# Patient Record
Sex: Male | Born: 1940 | Race: White | Hispanic: No | Marital: Married | Smoking: Never smoker
Health system: Southern US, Community
[De-identification: ages and names within clinical notes are randomized; demographics above are authoritative.]

## PROBLEM LIST (undated history)

## (undated) DIAGNOSIS — R05 Cough: Secondary | ICD-10-CM

## (undated) DIAGNOSIS — M75100 Unspecified rotator cuff tear or rupture of unspecified shoulder, not specified as traumatic: Secondary | ICD-10-CM

## (undated) DIAGNOSIS — K5792 Diverticulitis of intestine, part unspecified, without perforation or abscess without bleeding: Secondary | ICD-10-CM

## (undated) DIAGNOSIS — G4730 Sleep apnea, unspecified: Secondary | ICD-10-CM

## (undated) DIAGNOSIS — N200 Calculus of kidney: Secondary | ICD-10-CM

## (undated) DIAGNOSIS — E8881 Metabolic syndrome: Secondary | ICD-10-CM

## (undated) DIAGNOSIS — K5790 Diverticulosis of intestine, part unspecified, without perforation or abscess without bleeding: Secondary | ICD-10-CM

## (undated) DIAGNOSIS — E785 Hyperlipidemia, unspecified: Secondary | ICD-10-CM

## (undated) DIAGNOSIS — Z860100 Personal history of colon polyps, unspecified: Secondary | ICD-10-CM

## (undated) DIAGNOSIS — E119 Type 2 diabetes mellitus without complications: Secondary | ICD-10-CM

## (undated) DIAGNOSIS — Z85828 Personal history of other malignant neoplasm of skin: Secondary | ICD-10-CM

## (undated) DIAGNOSIS — N529 Male erectile dysfunction, unspecified: Secondary | ICD-10-CM

## (undated) DIAGNOSIS — I10 Essential (primary) hypertension: Secondary | ICD-10-CM

## (undated) DIAGNOSIS — E781 Pure hyperglyceridemia: Secondary | ICD-10-CM

## (undated) DIAGNOSIS — C801 Malignant (primary) neoplasm, unspecified: Secondary | ICD-10-CM

## (undated) DIAGNOSIS — R7303 Prediabetes: Secondary | ICD-10-CM

## (undated) DIAGNOSIS — I700 Atherosclerosis of aorta: Secondary | ICD-10-CM

## (undated) DIAGNOSIS — R51 Headache: Secondary | ICD-10-CM

## (undated) DIAGNOSIS — M1990 Unspecified osteoarthritis, unspecified site: Secondary | ICD-10-CM

## (undated) DIAGNOSIS — T7840XA Allergy, unspecified, initial encounter: Secondary | ICD-10-CM

## (undated) HISTORY — DX: Type 2 diabetes mellitus without complications: E11.9

## (undated) HISTORY — DX: Atherosclerosis of aorta: I70.0

## (undated) HISTORY — DX: Metabolic syndrome: E88.81

## (undated) HISTORY — DX: Diverticulosis of intestine, part unspecified, without perforation or abscess without bleeding: K57.90

## (undated) HISTORY — DX: Calculus of kidney: N20.0

## (undated) HISTORY — PX: NASAL SINUS SURGERY: SHX719

## (undated) HISTORY — DX: Pure hyperglyceridemia: E78.1

## (undated) HISTORY — DX: Personal history of colonic polyps: Z86.010

## (undated) HISTORY — DX: Diverticulitis of intestine, part unspecified, without perforation or abscess without bleeding: K57.92

## (undated) HISTORY — DX: Allergy, unspecified, initial encounter: T78.40XA

## (undated) HISTORY — DX: Male erectile dysfunction, unspecified: N52.9

## (undated) HISTORY — PX: COLONOSCOPY W/ BIOPSIES AND POLYPECTOMY: SHX1376

## (undated) HISTORY — PX: RECTAL BIOPSY: SHX2303

## (undated) HISTORY — DX: Headache: R51

## (undated) HISTORY — DX: Malignant (primary) neoplasm, unspecified: C80.1

## (undated) HISTORY — DX: Hyperlipidemia, unspecified: E78.5

## (undated) HISTORY — DX: Personal history of colon polyps, unspecified: Z86.0100

## (undated) HISTORY — DX: Essential (primary) hypertension: I10

---
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
## 2007-01-01 ENCOUNTER — Ambulatory Visit: Payer: Self-pay | Admitting: Internal Medicine

## 2007-01-28 ENCOUNTER — Ambulatory Visit: Payer: Self-pay | Admitting: Internal Medicine

## 2008-08-11 ENCOUNTER — Encounter (INDEPENDENT_AMBULATORY_CARE_PROVIDER_SITE_OTHER): Payer: Self-pay | Admitting: General Surgery

## 2008-08-11 ENCOUNTER — Ambulatory Visit (HOSPITAL_COMMUNITY): Admission: RE | Admit: 2008-08-11 | Discharge: 2008-08-11 | Payer: Self-pay | Admitting: General Surgery

## 2008-08-23 ENCOUNTER — Encounter: Admission: RE | Admit: 2008-08-23 | Discharge: 2008-08-23 | Payer: Self-pay | Admitting: Neurology

## 2008-11-20 ENCOUNTER — Encounter (INDEPENDENT_AMBULATORY_CARE_PROVIDER_SITE_OTHER): Payer: Self-pay | Admitting: Otolaryngology

## 2008-11-20 ENCOUNTER — Ambulatory Visit (HOSPITAL_BASED_OUTPATIENT_CLINIC_OR_DEPARTMENT_OTHER): Admission: RE | Admit: 2008-11-20 | Discharge: 2008-11-20 | Payer: Self-pay | Admitting: Otolaryngology

## 2010-07-27 LAB — GLUCOSE, CAPILLARY: Glucose-Capillary: 112 mg/dL — ABNORMAL HIGH (ref 70–99)

## 2010-07-27 LAB — POCT I-STAT, CHEM 8
Creatinine, Ser: 1.1 mg/dL (ref 0.4–1.5)
Hemoglobin: 12.6 g/dL — ABNORMAL LOW (ref 13.0–17.0)
Potassium: 4.3 mEq/L (ref 3.5–5.1)
Sodium: 139 mEq/L (ref 135–145)

## 2010-07-31 LAB — COMPREHENSIVE METABOLIC PANEL
ALT: 36 U/L (ref 0–53)
AST: 32 U/L (ref 0–37)
Alkaline Phosphatase: 35 U/L — ABNORMAL LOW (ref 39–117)
CO2: 29 mEq/L (ref 19–32)
Chloride: 104 mEq/L (ref 96–112)
Creatinine, Ser: 1.37 mg/dL (ref 0.4–1.5)
GFR calc Af Amer: >60 mL/min (ref >60)
GFR calc non Af Amer: 52 mL/min — ABNORMAL LOW (ref >60)
Potassium: 4.6 mEq/L (ref 3.5–5.1)
Sodium: 140 mEq/L (ref 135–145)
Total Bilirubin: 0.9 mg/dL (ref 0.3–1.2)

## 2010-07-31 LAB — DIFFERENTIAL
Basophils Absolute: 0 10*3/uL (ref 0.0–0.1)
Eosinophils Absolute: 0.2 10*3/uL (ref 0.0–0.7)
Eosinophils Relative: 3 % (ref 0–5)
Monocytes Absolute: 0.7 10*3/uL (ref 0.1–1.0)

## 2010-07-31 LAB — CBC
MCV: 97.1 fL (ref 78.0–100.0)
RBC: 4.2 MIL/uL — ABNORMAL LOW (ref 4.22–5.81)
WBC: 6.4 10*3/uL (ref 4.0–10.5)

## 2010-07-31 LAB — GLUCOSE, CAPILLARY: Glucose-Capillary: 114 mg/dL — ABNORMAL HIGH (ref 70–99)

## 2010-09-03 NOTE — Op Note (Signed)
NAMEZARION, OLIFF            ACCOUNT NO.:  [id number]   MEDICAL RECORD NO.:  [id number]          PATIENT TYPE:  AMB   LOCATION:  DSC                          FACILITY:  [facility]   PHYSICIAN:  Jefry H. Pollyann Kennedy, MD     DATE OF BIRTH:  01-17-1941   DATE OF PROCEDURE:  11/20/2008  DATE OF DISCHARGE:                               OPERATIVE REPORT   REFERRING PHYSICIAN:  Oley Balm. Georgina Pillion, MD   PREOPERATIVE DIAGNOSIS:  Chronic left ethmoid sinusitis.   POSTOPERATIVE DIAGNOSIS:  Chronic left ethmoid sinusitis.   PROCEDURE:  Left endoscopic anterior ethmoidectomy.   SURGEON:  Jefry H. Pollyann Kennedy, MD   General endotracheal anesthesia was used.   No complications.   BLOOD LOSS:  Minimal.   FINDINGS:  An isolated superior ethmoid cell on the left completely  obstructed and filled with thick mucopurulent material.   HISTORY:  A 70 year old with a history of chronic left periorbital  headache and evidence of chronic left ethmoid sinusitis that has failed  to respond to medical therapy.  Risks, benefits, alternatives, and  complications of procedure were explained to the patient, seemed to  understand and agreed for surgery.   PROCEDURE:  The patient was taken to the operating room and placed on  the operating table in supine position.  Following induction of general  endotracheal anesthesia, the patient was positioned for endoscopic sinus  surgery and draped in a standard fashion.  Afrin spray was used  preoperatively in the nasal cavities.  Xylocaine 1% with epinephrine was  infiltrated into the superior and posterior attachments of the middle  turbinate and the lateral nasal wall at the middle meatus.  The sickle  knife was used to incise the base of the uncinate process which was then  removed with straight Wilde Blakesley forceps.  Using the 0-degree  endoscope, the infundibular area was inspected thoroughly and the bulla  was opened with a straight suction.  The microdebrider was then  used to  completely open up the ethmoid bulla and the anterior ethmoid cells.  The diseased cell was identified and suction was used to enter into this  particular sinus where large amount of thick inspissated mucopus was  obtained and suctioned out.  The sinus was opened widely using the  microdebrider and upbiting forceps.  There were no other abnormalities  identified.  Afrin-soaked pledgets were placed in the anterior ethmoid  cavity on the left and kept in place until after extubation.  The  patient was extubated and awakened from anesthesia.  The packing was  removed and the patient was then transferred to recovery in stable  condition.      Jefry H. Pollyann Kennedy, MD  Electronically Signed     JHR/MEDQ  D:  11/20/2008  T:  11/21/2008  Job:  [id number]   cc:   Oley Balm. Georgina Pillion, M.D.

## 2010-09-03 NOTE — Op Note (Signed)
NAME:  Christian Ross, Christian Ross            ACCOUNT NO.:  [id number]   MEDICAL RECORD NO.:  [id number]          PATIENT TYPE:  AMB   LOCATION:  DAY                          [facility]   PHYSICIAN:  Adolph Pollack, M.D.DATE OF BIRTH:  [birth year]   DATE OF PROCEDURE:  08/11/2008  DATE OF DISCHARGE:                               OPERATIVE REPORT   PREOPERATIVE DIAGNOSES:  Persistent anal ulcer.   POSTOPERATIVE DIAGNOSES:  Persistent anal ulcer.   PROCEDURE:  1. Examination under anesthesia.  2. Anoscopy.  3. Biopsy of anal ulcer.   SURGEON:  Adolph Pollack, M.D.   ANESTHESIA:  General.   INDICATIONS FOR PROCEDURE:  [name] is a +70 year old male who has  some significant perianal skin changes with biopsy benign; however, he  had what I felt was initially an anal fissure; however, as his perianal  skin changes  have improved and he is relatively asymptomatic from this,  and it looks more like an anal ulcer suspicious for possible squamous  cell cancer.  It is for this reason that he is brought to the operating  room.   DESCRIPTION OF PROCEDURE:  He was brought to the operating room and  placed supine on the operating table and a general anesthetic was  administered.  He was placed in the lithotomy position.  The perianal  area was sterilely prepped and draped.  An external exam demonstrated an  external hemorrhoid, right anterolateral position.  On digital rectal  examination, no masses were felt.   An anoscopy was performed and he had a significantly-sized posterior  anal ulcer present.  I went ahead and sharply biopsied this and sent it  to pathology.  The bleeding was controlled with electrocautery.  No  other lesions in the anus or distal rectum were noted.   Once hemostasis was adequate, I performed an anal block with 0.5%  Marcaine.  A piece of Gelfoam was then placed over the wound and a bulky  dressing was applied.   He tolerated procedure well without  apparent complications and was taken  to recovery in satisfactory condition.      Adolph Pollack, M.D.  Electronically Signed     TJR/MEDQ  D:  08/11/2008  T:  08/11/2008  Job:  [id number]   cc:   Vesta Mixer, M.D.  Fax: [phone]   Oley Balm. Georgina Pillion, M.D.  Fax: [phone]

## 2010-09-30 ENCOUNTER — Telehealth: Payer: Self-pay | Admitting: Cardiovascular Disease

## 2010-09-30 NOTE — Telephone Encounter (Signed)
Calling about a generic medication version of Abapro that his pharmacy is trying to switch him too.. [facility] [address] [facility] his prescriptions of Crestor, Marta Lamas and Crestor and when he calls back they say that they havent been verified

## 2010-09-30 NOTE — Telephone Encounter (Signed)
Pt called, we haven't received any request from [facility], he will find out and call back. 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

## 2010-10-15 ENCOUNTER — Emergency Department (HOSPITAL_COMMUNITY)
Admission: EM | Admit: 2010-10-15 | Discharge: 2010-10-15 | Disposition: A | Discharge status: Home / Self Care | Payer: Medicare Other | Attending: Emergency Medicine | Admitting: Emergency Medicine

## 2010-10-15 ENCOUNTER — Telehealth: Payer: Self-pay | Admitting: Internal Medicine

## 2010-10-15 ENCOUNTER — Emergency Department (HOSPITAL_COMMUNITY): Payer: Medicare Other

## 2010-10-15 DIAGNOSIS — E7800 Pure hypercholesterolemia, unspecified: Secondary | ICD-10-CM | POA: Insufficient documentation

## 2010-10-15 DIAGNOSIS — K5732 Diverticulitis of large intestine without perforation or abscess without bleeding: Secondary | ICD-10-CM | POA: Insufficient documentation

## 2010-10-15 DIAGNOSIS — R1032 Left lower quadrant pain: Secondary | ICD-10-CM | POA: Insufficient documentation

## 2010-10-15 DIAGNOSIS — I10 Essential (primary) hypertension: Secondary | ICD-10-CM | POA: Insufficient documentation

## 2010-10-15 LAB — COMPREHENSIVE METABOLIC PANEL
Albumin: 4.2 g/dL (ref 3.5–5.2)
Alkaline Phosphatase: 49 U/L (ref 39–117)
BUN: 19 mg/dL (ref 6–23)
Calcium: 10.4 mg/dL (ref 8.4–10.5)
GFR calc Af Amer: >60 mL/min (ref >60)
Glucose, Bld: 102 mg/dL — ABNORMAL HIGH (ref 70–99)
Potassium: 4.7 mEq/L (ref 3.5–5.1)
Sodium: 135 mEq/L (ref 135–145)
Total Protein: 7.7 g/dL (ref 6.0–8.3)

## 2010-10-15 LAB — LIPASE, BLOOD: Lipase: 38 U/L (ref 11–59)

## 2010-10-15 LAB — CBC
HCT: 40.3 % (ref 39.0–52.0)
MCH: 31.8 pg (ref 26.0–34.0)
MCHC: 33.3 g/dL (ref 30.0–36.0)
RDW: 13.4 % (ref 11.5–15.5)

## 2010-10-15 LAB — DIFFERENTIAL
Basophils Absolute: 0 10*3/uL (ref 0.0–0.1)
Basophils Relative: 0 % (ref 0–1)
Eosinophils Relative: 3 % (ref 0–5)
Monocytes Absolute: 1.3 10*3/uL — ABNORMAL HIGH (ref 0.1–1.0)
Monocytes Relative: 12 % (ref 3–12)

## 2010-10-15 MED ORDER — IOHEXOL 300 MG/ML  SOLN
100.0000 mL | Freq: Once | INTRAMUSCULAR | Status: AC | PRN
  Administered 2010-10-15: 100 mL via INTRAVENOUS

## 2010-10-15 NOTE — Telephone Encounter (Signed)
Left message for patient to call back  

## 2010-10-16 NOTE — Telephone Encounter (Signed)
Left message for patient to call back  

## 2010-10-17 NOTE — Telephone Encounter (Signed)
I have left another message that if they still need an appt please call back and ask to speak with me.

## 2010-10-28 ENCOUNTER — Encounter: Payer: Self-pay | Admitting: Internal Medicine

## 2010-10-31 ENCOUNTER — Encounter: Payer: Self-pay | Admitting: Cardiovascular Disease

## 2010-11-07 ENCOUNTER — Ambulatory Visit: Payer: Self-pay | Admitting: Cardiovascular Disease

## 2010-12-13 ENCOUNTER — Ambulatory Visit: Admitting: Internal Medicine

## 2010-12-18 ENCOUNTER — Ambulatory Visit: Payer: Self-pay | Admitting: Cardiovascular Disease

## 2011-01-02 ENCOUNTER — Encounter: Payer: Self-pay | Admitting: Cardiovascular Disease

## 2011-01-02 ENCOUNTER — Ambulatory Visit (INDEPENDENT_AMBULATORY_CARE_PROVIDER_SITE_OTHER): Payer: Medicare Other | Admitting: Cardiovascular Disease

## 2011-01-02 DIAGNOSIS — I10 Essential (primary) hypertension: Secondary | ICD-10-CM | POA: Insufficient documentation

## 2011-01-02 DIAGNOSIS — E785 Hyperlipidemia, unspecified: Secondary | ICD-10-CM

## 2011-01-02 MED ORDER — IRBESARTAN 150 MG PO TABS: 150.0000 mg | ORAL_TABLET | Freq: Every day | ORAL | Status: DC

## 2011-01-02 NOTE — Assessment & Plan Note (Signed)
His lipid levels have been well controlled. We'll continue with the same medications.

## 2011-01-02 NOTE — Assessment & Plan Note (Signed)
His blood pressure remained well controlled. I'll see him again in one year.

## 2011-01-02 NOTE — Progress Notes (Signed)
Christian Ross Date of Birth  03-28-1941 [facility] [address]     [address] [place], [place]  [address] [phone]  Fax  [phone]  History of Present Illness:  70 year old gentleman with a history of hyperlipidemia, hypertension, diabetes mellitus. He's done very well since I last saw him a year ago. He has not had any episodes of chest pain or shortness of breath. He's been taking some coconut oil in his lipid profile has improved. His hemoglobin A1c is also improved slightly from 6 months ago.    Current Outpatient Prescriptions on File Prior to Visit  Medication Sig Dispense Refill  . Ascorbic Acid (VITAMIN C PO) Take 300 mg by mouth daily.        Marland Kitchen aspirin 81 MG tablet Take 81 mg by mouth daily.        . B Complex Vitamins (B COMPLEX PO) Take by mouth.        . Cholecalciferol (VITAMIN D PO) Take by mouth daily. Vitamin D3      . Cholecalciferol (VITAMIN D) 2000 UNITS CAPS Take 2,000 Units by mouth daily.        Marland Kitchen COENZYME Q-10 PO Take by mouth daily. ubiquinol      . fenofibrate (TRICOR) 145 MG tablet Take 145 mg by mouth daily.        . Irbesartan (AVAPRO PO) Take 50 mg by mouth daily.        . Multiple Vitamin (MULTIVITAMIN PO) Take by mouth.        Marland Kitchen NIACIN PO Take 500 mg by mouth daily.        . Omega-3-acid Ethyl Esters (LOVAZA PO) Take by mouth 3 (three) times daily.        . rosuvastatin (CRESTOR) 10 MG tablet Take 10 mg by mouth daily.        Marland Kitchen VITAMIN A PO Take by mouth daily.          Allergies  Allergen Reactions  . Lisinopril     Causes a cough    Past Medical History  Diagnosis Date  . Hyperlipidemia   . Vitamin D deficiency   . Hypertension     orthostatic  . Hypertriglyceridemia   . Insulin resistance   . Erectile dysfunction   . History of colonoscopy 01-28-2007    Past Surgical History  Procedure Date  . Knee surgery 1960  . 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 Colonoscopy w/ biopsies and  polypectomy 06/2003, 01/2007    tubular and hyperplastic polyps, liver mastastasis, external hemorrhoids, diverticulosis    History  Smoking status  . Never Smoker   Smokeless tobacco  . Not on file    History  Alcohol Use  . Yes    Beer Occasionally    No family history on file.  Reviw of Systems:  Reviewed in the HPI.  All other systems are negative.  Physical Exam: BP 138/82  Pulse 88  Ht 6' (1.829 m)  Wt 209 lb (94.802 kg)  BMI 28.35 kg/m2 The patient is alert and oriented x 3.  The mood and affect are normal.   Skin: warm and dry.  Color is normal.    HEENT:   the sclera are nonicteric.  The mucous membranes are moist.  The carotids are 2+ without bruits.  There is no thyromegaly.  There is no JVD.    Lungs: clear.  The chest wall is non tender.    Heart: regular  rate with a normal S1 and S2.  There are no murmurs, gallops, or rubs. The PMI is not displaced.     Abdomen: good bowel sounds.  There is no guarding or rebound.  There is no hepatosplenomegaly or tenderness.  There are no masses.   Extremities:  no clubbing, cyanosis, or edema.  The legs are without rashes.  The distal pulses are intact.   Neuro:  Cranial nerves II - XII are intact.  Motor and sensory functions are intact.    The gait is normal.  ECG:  Assessment / Plan:

## 2011-01-20 ENCOUNTER — Ambulatory Visit (INDEPENDENT_AMBULATORY_CARE_PROVIDER_SITE_OTHER): Payer: Medicare Other | Admitting: Internal Medicine

## 2011-01-20 ENCOUNTER — Encounter: Payer: Self-pay | Admitting: Internal Medicine

## 2011-01-20 VITALS — BP 128/60 | HR 62 | Ht 72.0 in | Wt 208.0 lb

## 2011-01-20 DIAGNOSIS — Z860100 Personal history of colon polyps, unspecified: Secondary | ICD-10-CM | POA: Insufficient documentation

## 2011-01-20 DIAGNOSIS — K5730 Diverticulosis of large intestine without perforation or abscess without bleeding: Secondary | ICD-10-CM

## 2011-01-20 NOTE — Assessment & Plan Note (Signed)
He is well now. At risk for repeated diverticulitis but would not do anything more than observe. Instructions provided - to increase fiber in diet.

## 2011-01-20 NOTE — Patient Instructions (Addendum)
Diverticulosis Diverticulosis is a common condition that develops when small pouches (diverticula) form in the wall of the colon. The risk of diverticulosis increases with age. It happens more often in people who eat a low-fiber diet. Most individuals with diverticulosis have no symptoms. Those individuals with symptoms usually experience belly (abdominal) pain, constipation, or loose stools (diarrhea). HOME CARE INSTRUCTIONS  Increase the amount of fiber in your diet as directed by your caregiver or dietician. This may reduce symptoms of diverticulosis. Try to maintain a high-fiber diet.  Your caregiver may recommend taking a dietary fiber supplement. If constipated.  Drink at least 6 to 8 glasses of water or other fluid each day to prevent constipation.   Try not to strain when you have a bowel movement.   Your caregiver may recommend avoiding nuts and seeds to prevent complications, although this is still an uncertain benefit. NOT NECESSARY UNLESS CAUSING PROBLEMS  Only take over-the-counter or prescription medicines for pain, discomfort, or fever as directed by your caregiver.  FOODS HAVING HIGH FIBER CONTENT INCLUDE:  Fruits. Apple, peach, pear, tangerine, raisins, prunes.   Vegetables. Brussels sprouts, asparagus, broccoli, cabbage, carrot, cauliflower, romaine lettuce, spinach, summer squash, tomato, winter squash, zucchini.   Starchy Vegetables. Baked beans, kidney beans, lima beans, split peas, lentils, potatoes (with skin).   Grains. Whole wheat bread, brown rice, bran flake cereal, plain oatmeal, white rice, shredded wheat, bran muffins.  SEEK IMMEDIATE MEDICAL CARE IF:  You develop increasing pain or severe bloating.   You have an oral temperature above 100.5 F, not controlled by medicine.   You develop vomiting or bowel movements that are bloody or black.  Document Released: 01/03/2004 Document Re-Released: 09/25/2009 [facility] Patient Information 2011 [place],  [place].  Follow-up as needed.

## 2011-01-20 NOTE — Progress Notes (Signed)
  Subjective:    Patient ID: Christian Ross, male    DOB: November 02, 1940, 70 y.o.   MRN: [id number]  HPI He was diagnosed and treated for left colon diverticulitis in late June 2012. CT confirmed. No pain and no GI symptoms or signs since.    Review of Systems All other negative    Objective:   Physical Exam General:  WDWN NAD Eyes: anicteric Lungs: clear Heart: S1S2 no rubs, murmurs or gallops Abdomen: soft and nontender, BS+           Assessment & Plan:

## 2011-01-21 ENCOUNTER — Encounter: Payer: Self-pay | Admitting: Internal Medicine

## 2011-04-14 ENCOUNTER — Encounter: Payer: Self-pay | Admitting: Cardiovascular Disease

## 2011-04-29 ENCOUNTER — Telehealth: Payer: Self-pay | Admitting: Cardiovascular Disease

## 2011-04-29 ENCOUNTER — Other Ambulatory Visit: Payer: Self-pay | Admitting: *Deleted

## 2011-04-29 NOTE — Telephone Encounter (Signed)
Pt requesting a nurse call °

## 2011-04-29 NOTE — Telephone Encounter (Signed)
Pt is on crestor 10 mg also with tricor / generic was new for him when he noticed muscle ache. Myopathy comes up as warning for that combination when i went to refill as DAW, i stopped and wanted to see if you wanted to keep the same or change? Please advise.

## 2011-04-29 NOTE — Telephone Encounter (Signed)
He may have brand name

## 2011-04-29 NOTE — Telephone Encounter (Signed)
Opened in error

## 2011-04-29 NOTE — Telephone Encounter (Signed)
Pt c/o muscle ache with generic tricor pt would like name brand, please advise.

## 2011-04-30 ENCOUNTER — Telehealth: Payer: Self-pay | Admitting: Cardiovascular Disease

## 2011-04-30 NOTE — Telephone Encounter (Signed)
Christian Ross has multiple medication changes, lipid meds, HTN meds.  He'll need an office visit to sort this out.

## 2011-04-30 NOTE — Telephone Encounter (Signed)
See note/ app set

## 2011-04-30 NOTE — Telephone Encounter (Signed)
Fu call again Patient is calling your back

## 2011-04-30 NOTE — Telephone Encounter (Signed)
Fu call °Patient is returning your call °

## 2011-05-26 ENCOUNTER — Ambulatory Visit: Payer: Medicare Other | Admitting: Cardiovascular Disease

## 2011-06-04 ENCOUNTER — Ambulatory Visit (INDEPENDENT_AMBULATORY_CARE_PROVIDER_SITE_OTHER): Payer: Medicare Other | Admitting: Cardiovascular Disease

## 2011-06-04 ENCOUNTER — Telehealth: Payer: Self-pay | Admitting: Cardiovascular Disease

## 2011-06-04 ENCOUNTER — Encounter: Payer: Self-pay | Admitting: Cardiovascular Disease

## 2011-06-04 DIAGNOSIS — I10 Essential (primary) hypertension: Secondary | ICD-10-CM

## 2011-06-04 DIAGNOSIS — E785 Hyperlipidemia, unspecified: Secondary | ICD-10-CM

## 2011-06-04 MED ORDER — METOPROLOL TARTRATE 25 MG PO TABS: 25.0000 mg | ORAL_TABLET | Freq: Two times a day (BID) | ORAL | Status: DC

## 2011-06-04 NOTE — Telephone Encounter (Signed)
MEDICATION DISCUSSED/ METOPROLOL TARTRATE VS SUCCINATE. PT AGREED TO PLAN.

## 2011-06-04 NOTE — Progress Notes (Signed)
Christian Ross Date of Birth  [birth year] [facility]     [place] Office  [address]    [address]   [address] [place], [place]  [address]    [place], [place]  [address] [phone]  Fax  [phone]  [phone]  Fax [phone]  1. Hyperlipidemia  History of Present Illness:  Christian Ross presents today for further discussion of his blood pressure medicines and costal medicines. His insurance company initially changed his TriCor to generic fenofibrate. Following that the generic fenofibrate was also reviewed.  He's also been having some muscle aches and pains with a combination of Crestor and fenofibrate.  Current Outpatient Prescriptions on File Prior to Visit  Medication Sig Dispense Refill  . Ascorbic Acid (VITAMIN C PO) Take 300 mg by mouth daily.        Marland Kitchen aspirin 81 MG tablet Take 81 mg by mouth daily.        . B Complex Vitamins (B COMPLEX PO) Take by mouth.        . Cholecalciferol (VITAMIN D PO) Take by mouth daily. Vitamin D3      . Cholecalciferol (VITAMIN D) 2000 UNITS CAPS Take 2,000 Units by mouth daily.        Marland Kitchen COENZYME Q-10 PO Take by mouth daily. ubiquinol      . Homeopathic Products (BERBERIS HOMACCORD PO) Take by mouth 3 (three) times daily.        . irbesartan (AVAPRO) 150 MG tablet Take 1 tablet (150 mg total) by mouth daily.  90 tablet  3  . Multiple Vitamin (MULTIVITAMIN PO) Take by mouth.        Marland Kitchen NIACIN PO Take 500 mg by mouth daily.        . Omega-3-acid Ethyl Esters (LOVAZA PO) Take by mouth 3 (three) times daily.        . rosuvastatin (CRESTOR) 10 MG tablet Take 10 mg by mouth daily.        Marland Kitchen VITAMIN A PO Take by mouth daily.          Allergies  Allergen Reactions  . Lisinopril     Causes a cough    Past Medical History  Diagnosis Date  . Hyperlipidemia   . Vitamin d deficiency   . Hypertension     orthostatic  . Hypertriglyceridemia   . Insulin resistance   . Erectile dysfunction   . Hemorrhoids   . Inguinal  hernia   . Secondary malignant neoplasm of liver   . Hx of colonic polyps   . Diverticulitis     Past Surgical History  Procedure Date  . Knee surgery 1960    right  . 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 Colonoscopy w/ biopsies and polypectomy 06/2003, 01/2007    tubular and hyperplastic polyps, liver mastastasis, external hemorrhoids, diverticulosis  . Nasal sinus surgery     History  Smoking status  . Never Smoker   Smokeless tobacco  . Never Used    History  Alcohol Use No    Family History  Problem Relation Age of Onset  . Colon cancer Neg Hx     Reviw of Systems:  Reviewed in the HPI.  All other systems are negative.  Physical Exam: Blood pressure 144/80, pulse 96, resp. rate 16, height 6' (1.829 m), weight 214 lb (97.07 kg). General: Well developed, well nourished, in no acute distress.  Head: Normocephalic, atraumatic, sclera non-icteric, mucus membranes are moist,   Neck: Supple. Negative  for carotid bruits. JVD not elevated.  Lungs: Clear bilaterally to auscultation without wheezes, rales, or rhonchi. Breathing is unlabored.  Heart: RRR with S1 S2. No murmurs, rubs, or gallops appreciated.  Abdomen: Soft, non-tender, non-distended with normoactive bowel sounds. No hepatomegaly. No rebound/guarding. No obvious abdominal masses.  Msk:  Strength and tone appear normal for age.  Extremities: No clubbing or cyanosis. No edema.  Distal pedal pulses are 2+ and equal bilaterally.  Neuro: Alert and oriented X 3. Moves all extremities spontaneously.  Psych:  Responds to questions appropriately with a normal affect.  ECG:  Assessment / Plan:

## 2011-06-04 NOTE — Patient Instructions (Addendum)
Your physician wants you to follow-up in: 6 months  You will receive a reminder letter in the mail two months in advance. If you don't receive a letter, please call our office to schedule the follow-up appointment.  Your physician has recommended you make the following change in your medication:   Start metoprolol 25 mg one tablet twice a day 12 hrs apart   Your physician recommends that you return for a FASTING lipid profile: 6 months

## 2011-06-04 NOTE — Telephone Encounter (Signed)
New msg Pt saw Dr Elease Hashimoto this morning. He had questions about metoprolol. Please call

## 2011-06-04 NOTE — Assessment & Plan Note (Signed)
He was having some problems with the fenofibrate and Crestor. At this point we will discontinue the fenofibrate completely. We will continue with the current dose of Crestor.  In our discussion, it became clear that he was taking nicotinamide which is  the inactive form of niacin. I've recommended that he take nicotinic acid instead.  We'll recheck his lipids again in 6 months.

## 2011-06-23 ENCOUNTER — Other Ambulatory Visit: Payer: Self-pay | Admitting: *Deleted

## 2011-06-24 ENCOUNTER — Other Ambulatory Visit: Payer: Self-pay

## 2011-06-24 DIAGNOSIS — I10 Essential (primary) hypertension: Secondary | ICD-10-CM

## 2011-06-24 MED ORDER — IRBESARTAN 150 MG PO TABS: 150.0000 mg | ORAL_TABLET | Freq: Every day | ORAL | Status: DC

## 2011-06-26 ENCOUNTER — Other Ambulatory Visit: Payer: Self-pay | Admitting: *Deleted

## 2011-06-26 ENCOUNTER — Telehealth: Payer: Self-pay | Admitting: Cardiovascular Disease

## 2011-06-26 MED ORDER — VALSARTAN 160 MG PO TABS: 160.0000 mg | ORAL_TABLET | Freq: Every day | ORAL | Status: DC

## 2011-06-26 NOTE — Telephone Encounter (Signed)
PT CALLED WITH MED CHANGE DUE TO INSURANCE. TOLD TO TAKE BP READING AT LEAST 3XWEEK AND CALL WITH ANY QUESTIONS PT VERBALIZED UNDERSTANDING.

## 2011-06-26 NOTE — Telephone Encounter (Signed)
avapro was denied per insurance agent, dr Elease Hashimoto will be shown list of approved meds and will call to pharmacy, pt made aware.

## 2011-06-26 NOTE — Telephone Encounter (Signed)
Please return call to patient on mobile# [phone]  Patient reports pharmacy does not have his RX for irbesartan (AVAPRO) 150 MG tablet.  Per refill note, Rx fille 06/24/11.  [facility] [address]   Please return call to patient on mobile # as he is out of medication.     Summary: Take 1 tablet (150 mg total) by mouth daily., Starting 06/24/2011, Until Wed 06/23/12, Normal Dose, Route, Frequency: 150 mg, Oral, Daily  Start: 06/24/2011  End: 06/23/2012  Ord/Sold: 06/24/2011 (O)  Report  Taking:  Long-term:   [id number] [place] [place] - [address] AT [address] [facility] [address]  Med Dose History  Change   Patient Sig: Take 1 tablet (150 mg total) by mouth daily.   Ordered on: 06/24/2011   Authorized by: Elyn Aquas.   Dispense: 90 tablet

## 2011-06-26 NOTE — Telephone Encounter (Signed)
Needs prior authorization

## 2011-08-04 ENCOUNTER — Encounter: Payer: Self-pay | Admitting: Cardiovascular Disease

## 2011-08-28 DIAGNOSIS — S93629A Sprain of tarsometatarsal ligament of unspecified foot, initial encounter: Secondary | ICD-10-CM | POA: Diagnosis not present

## 2011-08-28 DIAGNOSIS — M84376A Stress fracture, unspecified foot, initial encounter for fracture: Secondary | ICD-10-CM | POA: Diagnosis not present

## 2011-10-27 DIAGNOSIS — R1032 Left lower quadrant pain: Secondary | ICD-10-CM | POA: Diagnosis not present

## 2011-10-27 DIAGNOSIS — K5730 Diverticulosis of large intestine without perforation or abscess without bleeding: Secondary | ICD-10-CM | POA: Diagnosis not present

## 2011-10-29 DIAGNOSIS — R1032 Left lower quadrant pain: Secondary | ICD-10-CM | POA: Diagnosis not present

## 2011-10-29 DIAGNOSIS — K5730 Diverticulosis of large intestine without perforation or abscess without bleeding: Secondary | ICD-10-CM | POA: Diagnosis not present

## 2011-12-26 ENCOUNTER — Other Ambulatory Visit: Payer: Self-pay | Admitting: Dermatology

## 2011-12-26 DIAGNOSIS — D485 Neoplasm of uncertain behavior of skin: Secondary | ICD-10-CM | POA: Diagnosis not present

## 2011-12-26 DIAGNOSIS — L821 Other seborrheic keratosis: Secondary | ICD-10-CM | POA: Diagnosis not present

## 2011-12-26 DIAGNOSIS — D239 Other benign neoplasm of skin, unspecified: Secondary | ICD-10-CM | POA: Diagnosis not present

## 2011-12-26 DIAGNOSIS — L570 Actinic keratosis: Secondary | ICD-10-CM | POA: Diagnosis not present

## 2011-12-26 DIAGNOSIS — Z85828 Personal history of other malignant neoplasm of skin: Secondary | ICD-10-CM | POA: Diagnosis not present

## 2011-12-29 ENCOUNTER — Other Ambulatory Visit: Payer: Self-pay | Admitting: Cardiovascular Disease

## 2011-12-29 NOTE — Telephone Encounter (Signed)
Fax Received. Refill Completed. Tonishia Steffy Chowoe (R.M.A)   

## 2012-01-06 ENCOUNTER — Ambulatory Visit: Payer: Medicare Other | Admitting: Cardiovascular Disease

## 2012-02-10 ENCOUNTER — Encounter: Payer: Self-pay | Admitting: Cardiovascular Disease

## 2012-02-10 ENCOUNTER — Ambulatory Visit (INDEPENDENT_AMBULATORY_CARE_PROVIDER_SITE_OTHER): Payer: Medicare Other | Admitting: Cardiovascular Disease

## 2012-02-10 VITALS — BP 110/78 | HR 84 | Ht 72.0 in | Wt 222.8 lb

## 2012-02-10 DIAGNOSIS — E785 Hyperlipidemia, unspecified: Secondary | ICD-10-CM

## 2012-02-10 DIAGNOSIS — I10 Essential (primary) hypertension: Secondary | ICD-10-CM

## 2012-02-10 NOTE — Assessment & Plan Note (Signed)
He's tolerating his Crestor and fenofibrate now. He'll have his labs checked at his general medical doctors office in April. I'll see him again in May to review these.

## 2012-02-10 NOTE — Assessment & Plan Note (Signed)
He was doing very well. He's not had any episodes of chest pain or shortness breath. His blood pressure has been well controlled. Dr. Earlene Plater added Diovan to his medical regimen he seems to be working quite well. We'll continue the same medications. I'll see him again in about a 7-8 months.

## 2012-02-10 NOTE — Patient Instructions (Addendum)
Your physician wants you to follow-up in: 7 MONTHS // MAY You will receive a reminder letter in the mail two months in advance. If you don't receive a letter, please call our office to schedule the follow-up appointment.  BRING COPY OF LABS ON YOUR APPOIMNTMENT

## 2012-02-10 NOTE — Progress Notes (Signed)
Christian Ross Date of Birth  26-May-1940 [facility]      Office  [address]    [address]   [address] [place], [place]  [address]    [place], [place]  [address] [phone]  Fax  [phone]  [phone]  Fax [phone]  1. Hyperlipidemia 2.Hypertension  History of Present Illness:  Christian Ross seems to be doing well.  He continues to have elevations in his triglyceride levels.  He is tolerating the crestor now.    Current Outpatient Prescriptions on File Prior to Visit  Medication Sig Dispense Refill  . Ascorbic Acid (VITAMIN C PO) Take 300 mg by mouth daily.        Marland Kitchen aspirin 81 MG tablet Take 81 mg by mouth daily.        . B Complex Vitamins (B COMPLEX PO) Take by mouth.        . Cholecalciferol (VITAMIN D PO) Take by mouth daily. Vitamin D3      . Cholecalciferol (VITAMIN D) 2000 UNITS CAPS Take 2,000 Units by mouth daily.        Marland Kitchen COENZYME Q-10 PO Take by mouth daily. ubiquinol      . DIOVAN 160 MG tablet TAKE 1 TABLET BY MOUTH ONCE DAILY  90 tablet  3  . Homeopathic Products (BERBERIS HOMACCORD PO) Take by mouth 3 (three) times daily.        . metoprolol tartrate (LOPRESSOR) 25 MG tablet TAKE 1 TABLET BY MOUTH TWICE DAILY  180 tablet  3  . Multiple Vitamin (MULTIVITAMIN PO) Take by mouth.        . Niacin (NICOTINIC ACID) 250 MG CR capsule Take 500 mg by mouth at bedtime.      . Omega-3-acid Ethyl Esters (LOVAZA PO) Take by mouth 3 (three) times daily.        . rosuvastatin (CRESTOR) 10 MG tablet Take 10 mg by mouth daily.        Marland Kitchen VITAMIN A PO Take by mouth daily.        Marland Kitchen DISCONTD: irbesartan (AVAPRO) 150 MG tablet Take 1 tablet (150 mg total) by mouth daily.  90 tablet  3    Allergies  Allergen Reactions  . Lisinopril     Causes a cough    Past Medical History  Diagnosis Date  . Hyperlipidemia   . Vitamin D deficiency   . Hypertension     orthostatic  . Hypertriglyceridemia   . Insulin resistance   . Erectile dysfunction   .  Hemorrhoids   . Inguinal hernia   . 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 Hx of colonic polyps   . Diverticulitis     Past Surgical History  Procedure Date  . Knee surgery 1960    right  . 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 Colonoscopy w/ biopsies and polypectomy 06/2003, 01/2007    tubular and hyperplastic polyps, liver mastastasis, external hemorrhoids, diverticulosis  . Nasal sinus surgery     History  Smoking status  . Never Smoker   Smokeless tobacco  . Never Used    History  Alcohol Use No    Family History  Problem Relation Age of Onset  . Colon cancer Neg Hx     Reviw of Systems:  Reviewed in the HPI.  All other systems are negative.  Physical Exam: Blood pressure 110/78, pulse 84, height 6' (1.829 m), weight 222 lb 12.8 oz (101.061 kg). General: Well developed, well nourished,  in no acute distress.  Head: Normocephalic, atraumatic, sclera non-icteric, mucus membranes are moist,   Neck: Supple. Negative for carotid bruits. JVD not elevated.  Lungs: Clear bilaterally to auscultation without wheezes, rales, or rhonchi. Breathing is unlabored.  Heart: RRR with S1 S2. No murmurs, rubs, or gallops appreciated.  Abdomen: Soft, non-tender, non-distended with normoactive bowel sounds. No hepatomegaly. No rebound/guarding. No obvious abdominal masses.  Msk:  Strength and tone appear normal for age.  Extremities: No clubbing or cyanosis. No edema.  Distal pedal pulses are 2+ and equal bilaterally.  Neuro: Alert and oriented X 3. Moves all extremities spontaneously.  Psych:  Responds to questions appropriately with a normal affect.  ECG: August 04, 2011 - normal sinus rhythm at around 65 beats a minute. He has no ST or T wave changes. His EKG was performed at [facility] Ross office for FAA physical  Assessment / Plan:

## 2012-02-12 DIAGNOSIS — Z0000 Encounter for general adult medical examination without abnormal findings: Secondary | ICD-10-CM | POA: Diagnosis not present

## 2012-03-23 DIAGNOSIS — H524 Presbyopia: Secondary | ICD-10-CM | POA: Diagnosis not present

## 2012-03-23 DIAGNOSIS — H43819 Vitreous degeneration, unspecified eye: Secondary | ICD-10-CM | POA: Diagnosis not present

## 2012-04-08 DIAGNOSIS — R51 Headache: Secondary | ICD-10-CM | POA: Diagnosis not present

## 2012-05-06 ENCOUNTER — Encounter (HOSPITAL_COMMUNITY): Payer: Self-pay | Admitting: Emergency Medicine

## 2012-05-06 ENCOUNTER — Emergency Department (HOSPITAL_COMMUNITY)
Admission: EM | Admit: 2012-05-06 | Discharge: 2012-05-06 | Disposition: A | Discharge status: Home / Self Care | Payer: Medicare Other | Attending: Emergency Medicine | Admitting: Emergency Medicine

## 2012-05-06 DIAGNOSIS — E781 Pure hyperglyceridemia: Secondary | ICD-10-CM | POA: Diagnosis not present

## 2012-05-06 DIAGNOSIS — Z7982 Long term (current) use of aspirin: Secondary | ICD-10-CM | POA: Insufficient documentation

## 2012-05-06 DIAGNOSIS — H5789 Other specified disorders of eye and adnexa: Secondary | ICD-10-CM | POA: Insufficient documentation

## 2012-05-06 DIAGNOSIS — E785 Hyperlipidemia, unspecified: Secondary | ICD-10-CM | POA: Diagnosis not present

## 2012-05-06 DIAGNOSIS — Z860100 Personal history of colon polyps, unspecified: Secondary | ICD-10-CM | POA: Insufficient documentation

## 2012-05-06 DIAGNOSIS — Z79899 Other long term (current) drug therapy: Secondary | ICD-10-CM | POA: Diagnosis not present

## 2012-05-06 DIAGNOSIS — B029 Zoster without complications: Secondary | ICD-10-CM | POA: Diagnosis not present

## 2012-05-06 DIAGNOSIS — Z8719 Personal history of other diseases of the digestive system: Secondary | ICD-10-CM | POA: Insufficient documentation

## 2012-05-06 DIAGNOSIS — C787 Secondary malignant neoplasm of liver and intrahepatic bile duct: Secondary | ICD-10-CM | POA: Diagnosis not present

## 2012-05-06 DIAGNOSIS — I951 Orthostatic hypotension: Secondary | ICD-10-CM | POA: Diagnosis not present

## 2012-05-06 DIAGNOSIS — Z862 Personal history of diseases of the blood and blood-forming organs and certain disorders involving the immune mechanism: Secondary | ICD-10-CM | POA: Insufficient documentation

## 2012-05-06 DIAGNOSIS — B0233 Zoster keratitis: Secondary | ICD-10-CM | POA: Diagnosis not present

## 2012-05-06 DIAGNOSIS — Z8639 Personal history of other endocrine, nutritional and metabolic disease: Secondary | ICD-10-CM | POA: Insufficient documentation

## 2012-05-06 MED ORDER — FLUORESCEIN SODIUM 1 MG OP STRP
1.0000 | ORAL_STRIP | Freq: Once | OPHTHALMIC | Status: AC
  Administered 2012-05-06: 1 via OPHTHALMIC
  Filled 2012-05-06: qty 1

## 2012-05-06 MED ORDER — VALACYCLOVIR HCL 1 G PO TABS: 1000.0000 mg | ORAL_TABLET | Freq: Three times a day (TID) | ORAL | Status: AC

## 2012-05-06 MED ORDER — IBUPROFEN 200 MG PO TABS
600.0000 mg | ORAL_TABLET | Freq: Once | ORAL | Status: AC
  Administered 2012-05-06: 600 mg via ORAL
  Filled 2012-05-06: qty 3

## 2012-05-06 MED ORDER — OXYCODONE-ACETAMINOPHEN 5-325 MG PO TABS
1.0000 | ORAL_TABLET | Freq: Once | ORAL | Status: AC
  Administered 2012-05-06: 1 via ORAL
  Filled 2012-05-06: qty 1

## 2012-05-06 MED ORDER — OXYCODONE-ACETAMINOPHEN 5-325 MG PO TABS: 1.0000 | ORAL_TABLET | ORAL | Status: DC | PRN

## 2012-05-06 MED ORDER — PROPARACAINE HCL 0.5 % OP SOLN
2.0000 [drp] | Freq: Once | OPHTHALMIC | Status: AC
  Administered 2012-05-06: 2 [drp] via OPHTHALMIC
  Filled 2012-05-06: qty 15

## 2012-05-06 NOTE — ED Notes (Signed)
Pt presenting to ed with c/o left side only head pain. Pt denies injury at this time pt states pain causes him to not be able to open his eye and it causes him to tear up and to have nasal drip. Pt states onset x 2 days. Pt states no slurred speech, no facial droop no nausea or vomiting just head pain

## 2012-05-06 NOTE — ED Provider Notes (Signed)
History     CSN: [id number]  Arrival date & time 05/06/12  4098   First MD Initiated Contact with Patient 05/06/12 1023      Chief Complaint  Patient presents with  . Headache     The history is provided by the patient.   patient reports developing pain across his left forehead around his left eye and on the tip of his nose and left nostril over the past 2 days.  He reports even very superficial touch causes him significant pain in this area.  He's never had shingles before.  He also reports increasing pain in his left thigh with some tearing of his left eye and pain when he opens his left eye.  Some photophobia from his left eye.  No other complaints.  The patient is a Occupational hygienist.  Denies fevers or chills.  He nor his wife have noticed any new rash across his face.  No recent falls or trauma.  Past Medical History  Diagnosis Date  . Hyperlipidemia   . Vitamin D deficiency   . Hypertension     orthostatic  . Hypertriglyceridemia   . Insulin resistance   . Erectile dysfunction   . Hemorrhoids   . Inguinal hernia   . 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 Hx of colonic polyps   . Diverticulitis     Past Surgical History  Procedure Date  . Knee surgery 1960    right  . 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 Colonoscopy w/ biopsies and polypectomy 06/2003, 01/2007    tubular and hyperplastic polyps, liver mastastasis, external hemorrhoids, diverticulosis  . Nasal sinus surgery     Family History  Problem Relation Age of Onset  . Colon cancer Neg Hx     History  Substance Use Topics  . Smoking status: Never Smoker   . Smokeless tobacco: Never Used  . Alcohol Use: No     Comment: rarely      Review of Systems  Neurological: Positive for headaches.  All other systems reviewed and are negative.    Allergies  Lisinopril  Home Medications   Current Outpatient Rx  Name  Route  Sig  Dispense  Refill  . N-ACETYL-L-CYSTEINE PO   Oral   Take 260 mg by mouth daily.         . ALPHA LIPOIC ACID PO   Oral   Take 10 mg by mouth daily.         Marland Kitchen VITAMIN C 1000 MG PO TABS   Oral   Take 2,000 mg by mouth daily.         . ASPIRIN 81 MG PO TABS   Oral   Take 81 mg by mouth every evening.          . ASTAXANTHIN 4 MG PO CAPS   Oral   Take 1.5 capsules by mouth daily.         Marland Kitchen BERBERINE CHLORIDE POWD   Oral   Take 400 mg by mouth daily.         Marland Kitchen BLACK CURRANT SEED OIL 500 MG PO CAPS   Oral   Take 1 capsule by mouth daily.         Marland Kitchen CALCIUM CARB-CHOLECALCIFEROL 1000-800 MG-UNIT PO TABS   Oral   Take 1 tablet by mouth daily.         . CAYENNE 500 MG PO CAPS   Oral   Take 2 capsules by mouth daily.         Marland Kitchen  CHROMIUM 200 MCG PO CAPS   Oral   Take 1 capsule by mouth daily.         . COPPER GLUCONATE 2 MG PO CAPS   Oral   Take 1 capsule by mouth daily.         Marland Kitchen EYEBRIGHT HERB PO   Oral   Take 60 mg by mouth daily.         . FENOFIBRATE 145 MG PO TABS   Oral   Take 145 mg by mouth every evening.          Marland Kitchen FERROUS SULFATE DRIED ER 160 (50 FE) MG PO TBCR   Oral   Take 1 tablet by mouth daily.         Marland Kitchen CARROT FLAVOR POWD   Oral   Take 200 mg by mouth daily.         Marland Kitchen GARLIC 1000 MG PO CAPS   Oral   Take 1 capsule by mouth daily.         Marland Kitchen GLUCOSAMINE-CHONDROITIN 500-400 MG PO TABS   Oral   Take 1 tablet by mouth 3 (three) times daily.         . L-GLUTAMIC ACID PO   Oral   Take 60 mg by mouth daily.         Marland Kitchen GLYCINE PO   Oral   Take 60 mg by mouth daily.         . LUTEIN 15-0.7 MG PO CAPS   Oral   Take 1 capsule by mouth daily.         . LUTEIN-ZEAXANTHIN PO   Oral   Take 2 mg by mouth daily.         Marland Kitchen LYCOPENE PO   Oral   Take 3 mg by mouth daily.         Marland Kitchen MAGNESIUM 500 MG PO CAPS   Oral   Take 1 capsule by mouth daily.         Marland Kitchen MANGANESE 10 MG PO TABS   Oral   Take 1 tablet by mouth daily.         Marland Kitchen METOPROLOL TARTRATE 25 MG PO TABS   Oral   Take 25  mg by mouth 2 (two) times daily.         Marland Kitchen MOLYBDENUM PO   Oral   Take 500 mg by mouth daily.         . ADULT MULTIVITAMIN W/MINERALS CH   Oral   Take 1 tablet by mouth daily.         . OMEGA-3-ACID ETHYL ESTERS 1 G PO CAPS   Oral   Take 1 g by mouth 3 (three) times daily.         Marland Kitchen POTASSIUM 99 MG PO TABS   Oral   Take 1 tablet by mouth daily.         Marland Kitchen ROSUVASTATIN CALCIUM 10 MG PO TABS   Oral   Take 10 mg by mouth every evening.          . SAW PALMETTO (SERENOA REPENS) 500 MG PO CAPS   Oral   Take 500 mg by mouth daily.         Marland Kitchen SCHISANDRA 50 MG PO CAPS   Oral   Take 160 mg by mouth daily.         . SELENIUM 50 MCG PO TABS   Oral   Take 100 mcg by mouth 2 (two)  times daily.         Marland Kitchen TAURINE PO   Oral   Take 600 mg by mouth daily.         Marland Kitchen UBIQUINOL 100 MG PO CAPS   Oral   Take 1 capsule by mouth daily.         Marland Kitchen VALSARTAN 160 MG PO TABS   Oral   Take 160 mg by mouth every evening.         Marland Kitchen VITAMIN A [address] UNITS PO CAPS   Oral   Take 10,000 Units by mouth daily.         Marland Kitchen ZINC 30 MG PO CAPS   Oral   Take 1 capsule by mouth daily.         . OXYCODONE-ACETAMINOPHEN 5-325 MG PO TABS   Oral   Take 1 tablet by mouth every 4 (four) hours as needed for pain.   25 tablet   0   . VALACYCLOVIR HCL 1 G PO TABS   Oral   Take 1 tablet (1,000 mg total) by mouth 3 (three) times daily.   30 tablet   0     BP 148/90  Pulse 94  Temp 97.9 F (36.6 C) (Oral)  Resp 17  SpO2 96%  Physical Exam  Nursing note and vitals reviewed. Constitutional: He is oriented to person, place, and time. He appears well-developed and well-nourished.  HENT:  Head: Normocephalic and atraumatic.       Patient is to small herpetic looking lesions above his left eyebrow.  He has significant discomfort with light touch of his left forehead,  left cheek, periorbital  area and left distal nostril.  No obvious lesions noted at the tip of his nose.      Eyes: EOM are normal. Pupils are equal, round, and reactive to light.       His left eye is slightly red and tearing.  Some injection of his left conjunctiva.  Pupils normal.  Slitlamp demonstrates no evidence of flare or dendritic lesions with fluorescein staining.  No hypopyon present  Neck: Normal range of motion.  Cardiovascular: Normal rate, regular rhythm, normal heart sounds and intact distal pulses.   Pulmonary/Chest: Effort normal and breath sounds normal. No respiratory distress.  Abdominal: Soft. He exhibits no distension. There is no tenderness.  Musculoskeletal: Normal range of motion.  Neurological: He is alert and oriented to person, place, and time.  Skin: Skin is warm and dry.  Psychiatric: He has a normal mood and affect. Judgment normal.    ED Course  Procedures (including critical care time)  Labs Reviewed - No data to display No results found.   1. Herpes zoster infection       MDM  Patient.  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  He had significant relief of his left eye pain with proparacaine.  No obvious dendritic lesions were noted.  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  I called his ophthalmologist office Dr. Hazle Quant and is scheduled for a patient an appointment today in the office with his ophthalmologist at 2:30 PM for closer and more thorough valuation of his left eye.  The patient restarted on antiviral medicine and will be given pain medicine.  He had relief of his facial pain with Percocet in the emergency department.  He appears to have 2 early herpetic lesions over  his left eyebrow.        Lyanne Co, MD 05/06/12 [phone]

## 2012-05-06 NOTE — ED Notes (Signed)
Pt states Tuesday started having spells where he would have L sided head burning/pain, L eye and nostril would drain, spells would last about 30 minutes, had 2 Tuesday, 3 Wednesday and through out the night had spells, when pt opens L eye tears start draining immediately. Denies any other pain, denies shortness of breath, numbness/tingling or n/v/d.

## 2012-05-10 ENCOUNTER — Encounter (HOSPITAL_COMMUNITY): Payer: Self-pay | Admitting: *Deleted

## 2012-05-10 ENCOUNTER — Emergency Department (HOSPITAL_COMMUNITY)
Admission: EM | Admit: 2012-05-10 | Discharge: 2012-05-10 | Disposition: A | Discharge status: Home / Self Care | Payer: Medicare Other | Attending: Emergency Medicine | Admitting: Emergency Medicine

## 2012-05-10 DIAGNOSIS — Z860100 Personal history of colon polyps, unspecified: Secondary | ICD-10-CM | POA: Insufficient documentation

## 2012-05-10 DIAGNOSIS — Z8719 Personal history of other diseases of the digestive system: Secondary | ICD-10-CM | POA: Diagnosis not present

## 2012-05-10 DIAGNOSIS — I10 Essential (primary) hypertension: Secondary | ICD-10-CM | POA: Diagnosis not present

## 2012-05-10 DIAGNOSIS — Z7982 Long term (current) use of aspirin: Secondary | ICD-10-CM | POA: Insufficient documentation

## 2012-05-10 DIAGNOSIS — E781 Pure hyperglyceridemia: Secondary | ICD-10-CM | POA: Insufficient documentation

## 2012-05-10 DIAGNOSIS — C787 Secondary malignant neoplasm of liver and intrahepatic bile duct: Secondary | ICD-10-CM | POA: Diagnosis not present

## 2012-05-10 DIAGNOSIS — E785 Hyperlipidemia, unspecified: Secondary | ICD-10-CM | POA: Diagnosis not present

## 2012-05-10 DIAGNOSIS — Z79899 Other long term (current) drug therapy: Secondary | ICD-10-CM | POA: Diagnosis not present

## 2012-05-10 DIAGNOSIS — Z87448 Personal history of other diseases of urinary system: Secondary | ICD-10-CM | POA: Insufficient documentation

## 2012-05-10 DIAGNOSIS — B0229 Other postherpetic nervous system involvement: Secondary | ICD-10-CM | POA: Diagnosis not present

## 2012-05-10 MED ORDER — BUPIVACAINE HCL (PF) 0.5 % IJ SOLN
INTRAMUSCULAR | Status: AC
  Administered 2012-05-10: 16:00:00
  Filled 2012-05-10: qty 30

## 2012-05-10 MED ORDER — HYDROMORPHONE HCL 4 MG PO TABS: 4.0000 mg | ORAL_TABLET | ORAL | Status: DC | PRN

## 2012-05-10 NOTE — ED Provider Notes (Signed)
History     CSN: [id number]  Arrival date & time 05/10/12  1254   First MD Initiated Contact with Patient 05/10/12 1502      Chief Complaint  Patient presents with  . Herpes Zoster     HPI Pt dx with shingles last Thursday by Dr. Patria Mane. Given pain medication, but pain is unbearable. Pain to left side of face, forehead, eye area.   Past Medical History  Diagnosis Date  . Hyperlipidemia   . Vitamin D deficiency   . Hypertension     orthostatic  . Hypertriglyceridemia   . Insulin resistance   . Erectile dysfunction   . Hemorrhoids   . Inguinal hernia   . 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 Hx of colonic polyps   . Diverticulitis     Past Surgical History  Procedure Date  . Knee surgery 1960    right  . 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 Colonoscopy w/ biopsies and polypectomy 06/2003, 01/2007    tubular and hyperplastic polyps, liver mastastasis, external hemorrhoids, diverticulosis  . Nasal sinus surgery     Family History  Problem Relation Age of Onset  . Colon cancer Neg Hx     History  Substance Use Topics  . Smoking status: Never Smoker   . Smokeless tobacco: Never Used  . Alcohol Use: No     Comment: rarely      Review of Systems All other systems reviewed and are negative Allergies  Lisinopril  Home Medications   Current Outpatient Rx  Name  Route  Sig  Dispense  Refill  . N-ACETYL-L-CYSTEINE PO   Oral   Take 260 mg by mouth daily.         . ALPHA LIPOIC ACID PO   Oral   Take 10 mg by mouth daily.         Marland Kitchen VITAMIN C 1000 MG PO TABS   Oral   Take 2,000 mg by mouth daily.         . ASPIRIN 81 MG PO TABS   Oral   Take 81 mg by mouth every evening.          . ASTAXANTHIN 4 MG PO CAPS   Oral   Take 1.5 capsules by mouth daily.         Marland Kitchen BERBERINE CHLORIDE POWD   Oral   Take 400 mg by mouth daily.         Marland Kitchen BLACK CURRANT SEED OIL 500 MG PO CAPS   Oral   Take 1 capsule by mouth daily.         Marland Kitchen CALCIUM  CARB-CHOLECALCIFEROL 1000-800 MG-UNIT PO TABS   Oral   Take 1 tablet by mouth daily.         . CAYENNE 500 MG PO CAPS   Oral   Take 2 capsules by mouth daily.         . CHROMIUM 200 MCG PO CAPS   Oral   Take 1 capsule by mouth daily.         . COPPER GLUCONATE 2 MG PO CAPS   Oral   Take 1 capsule by mouth daily.         Marland Kitchen EYEBRIGHT HERB PO   Oral   Take 60 mg by mouth daily.         . FENOFIBRATE 145 MG PO TABS   Oral   Take 145 mg by mouth every evening.          Marland Kitchen  FERROUS SULFATE DRIED ER 160 (50 FE) MG PO TBCR   Oral   Take 1 tablet by mouth daily.         Marland Kitchen CARROT FLAVOR POWD   Oral   Take 200 mg by mouth daily.         Marland Kitchen GARLIC 1000 MG PO CAPS   Oral   Take 1 capsule by mouth daily.         Marland Kitchen GLUCOSAMINE-CHONDROITIN 500-400 MG PO TABS   Oral   Take 1 tablet by mouth 3 (three) times daily.         . L-GLUTAMIC ACID PO   Oral   Take 60 mg by mouth daily.         Marland Kitchen GLYCINE PO   Oral   Take 60 mg by mouth daily.         . LUTEIN 15-0.7 MG PO CAPS   Oral   Take 1 capsule by mouth daily.         . LUTEIN-ZEAXANTHIN PO   Oral   Take 2 mg by mouth daily.         Marland Kitchen LYCOPENE PO   Oral   Take 3 mg by mouth daily.         Marland Kitchen MAGNESIUM 500 MG PO CAPS   Oral   Take 1 capsule by mouth daily.         Marland Kitchen MANGANESE 10 MG PO TABS   Oral   Take 1 tablet by mouth daily.         Marland Kitchen METOPROLOL TARTRATE 25 MG PO TABS   Oral   Take 25 mg by mouth 2 (two) times daily.         Marland Kitchen MOLYBDENUM PO   Oral   Take 500 mg by mouth daily.         . ADULT MULTIVITAMIN W/MINERALS CH   Oral   Take 1 tablet by mouth daily.         . OMEGA-3-ACID ETHYL ESTERS 1 G PO CAPS   Oral   Take 1 g by mouth 3 (three) times daily.         . OXYCODONE-ACETAMINOPHEN 5-325 MG PO TABS   Oral   Take 1 tablet by mouth every 4 (four) hours as needed for pain.   25 tablet   0   . POTASSIUM 99 MG PO TABS   Oral   Take 1 tablet by mouth  daily.         Marland Kitchen ROSUVASTATIN CALCIUM 10 MG PO TABS   Oral   Take 10 mg by mouth every evening.          . SAW PALMETTO (SERENOA REPENS) 500 MG PO CAPS   Oral   Take 500 mg by mouth daily.         Marland Kitchen SCHISANDRA 50 MG PO CAPS   Oral   Take 160 mg by mouth daily.         . SELENIUM 50 MCG PO TABS   Oral   Take 100 mcg by mouth 2 (two) times daily.         Marland Kitchen TAURINE PO   Oral   Take 600 mg by mouth daily.         Marland Kitchen UBIQUINOL 100 MG PO CAPS   Oral   Take 1 capsule by mouth daily.         Marland Kitchen VALACYCLOVIR HCL 1 G PO TABS   Oral  Take 1 tablet (1,000 mg total) by mouth 3 (three) times daily.   30 tablet   0   . VALSARTAN 160 MG PO TABS   Oral   Take 160 mg by mouth every evening.         Marland Kitchen VITAMIN A 45409 UNITS PO CAPS   Oral   Take 10,000 Units by mouth daily.         Marland Kitchen ZINC 30 MG PO CAPS   Oral   Take 1 capsule by mouth daily.         Marland Kitchen HYDROMORPHONE HCL 4 MG PO TABS   Oral   Take 1 tablet (4 mg total) by mouth every 4 (four) hours as needed for pain.   30 tablet   0     BP 156/91  Pulse 105  Temp 98 F (36.7 C) (Oral)  Resp 16  SpO2 98%  Physical Exam  Nursing note and vitals reviewed. Constitutional: He is oriented to person, place, and time. He appears well-developed and well-nourished. No distress.  HENT:  Head: Normocephalic and atraumatic.  Eyes: Pupils are equal, round, and reactive to light.  Neck: Normal range of motion.  Cardiovascular: Normal rate and intact distal pulses.   Pulmonary/Chest: No respiratory distress.  Abdominal: Normal appearance. He exhibits no distension. There is no tenderness.  Musculoskeletal: Normal range of motion.  Neurological: He is alert and oriented to person, place, and time. No cranial nerve deficit.  Skin: Skin is warm and dry. Rash (A zoster rash noted left malar eminence and above left eye.  No significant change from previous exam.  No evidence of corneal involvement with slit-lamp.)  noted.  Psychiatric: He has a normal mood and affect. His behavior is normal.    ED Course  Procedures (including critical care time) Cervical injection with 3 cc 0.5% bupivacaine done using sterile technique just left to the C6-C7 interspace.  After the cervical injection patient said he was feeling much better and the pain was much improved.  Plan at this time is to increase his pain medicine to hydromorphone and he will continue followup with the ophthalmologist as directed. Labs Reviewed - No data to display No results found.   1. Postherpetic neuralgia       MDM          Nelia Shi, MD 05/10/12 [phone]

## 2012-05-10 NOTE — ED Notes (Signed)
Pt dx with shingles last Thursday by Dr. Patria Mane. Given pain medication, but pain is unbearable. Pain to left side of face, forehead, eye area.

## 2012-05-13 DIAGNOSIS — R51 Headache: Secondary | ICD-10-CM | POA: Diagnosis not present

## 2012-05-14 DIAGNOSIS — R51 Headache: Secondary | ICD-10-CM

## 2012-05-14 DIAGNOSIS — G501 Atypical facial pain: Secondary | ICD-10-CM | POA: Diagnosis not present

## 2012-05-14 DIAGNOSIS — H04129 Dry eye syndrome of unspecified lacrimal gland: Secondary | ICD-10-CM | POA: Diagnosis not present

## 2012-05-14 DIAGNOSIS — G44009 Cluster headache syndrome, unspecified, not intractable: Secondary | ICD-10-CM | POA: Diagnosis not present

## 2012-05-14 HISTORY — DX: Headache: R51

## 2012-05-18 DIAGNOSIS — R42 Dizziness and giddiness: Secondary | ICD-10-CM | POA: Diagnosis not present

## 2012-05-18 DIAGNOSIS — G501 Atypical facial pain: Secondary | ICD-10-CM | POA: Diagnosis not present

## 2012-05-18 DIAGNOSIS — G44009 Cluster headache syndrome, unspecified, not intractable: Secondary | ICD-10-CM | POA: Diagnosis not present

## 2012-05-25 DIAGNOSIS — R0989 Other specified symptoms and signs involving the circulatory and respiratory systems: Secondary | ICD-10-CM | POA: Diagnosis not present

## 2012-06-08 ENCOUNTER — Encounter: Payer: Self-pay | Admitting: Internal Medicine

## 2012-07-19 ENCOUNTER — Encounter: Payer: Self-pay | Admitting: Internal Medicine

## 2012-08-13 ENCOUNTER — Other Ambulatory Visit: Payer: Self-pay | Admitting: Neurology

## 2012-08-16 DIAGNOSIS — J309 Allergic rhinitis, unspecified: Secondary | ICD-10-CM | POA: Diagnosis not present

## 2012-08-19 DIAGNOSIS — J40 Bronchitis, not specified as acute or chronic: Secondary | ICD-10-CM | POA: Diagnosis not present

## 2012-08-19 DIAGNOSIS — J309 Allergic rhinitis, unspecified: Secondary | ICD-10-CM | POA: Diagnosis not present

## 2012-08-31 ENCOUNTER — Other Ambulatory Visit: Payer: Self-pay

## 2012-08-31 MED ORDER — VALSARTAN 160 MG PO TABS: 160.0000 mg | ORAL_TABLET | Freq: Every evening | ORAL | Status: DC

## 2012-08-31 MED ORDER — FENOFIBRATE 145 MG PO TABS: 145.0000 mg | ORAL_TABLET | Freq: Every evening | ORAL | Status: DC

## 2012-08-31 MED ORDER — METOPROLOL TARTRATE 25 MG PO TABS: 25.0000 mg | ORAL_TABLET | Freq: Two times a day (BID) | ORAL | Status: DC

## 2012-09-02 ENCOUNTER — Ambulatory Visit (AMBULATORY_SURGERY_CENTER): Payer: Medicare Other | Admitting: *Deleted

## 2012-09-02 VITALS — Ht 72.0 in | Wt 221.2 lb

## 2012-09-02 DIAGNOSIS — K5730 Diverticulosis of large intestine without perforation or abscess without bleeding: Secondary | ICD-10-CM

## 2012-09-02 DIAGNOSIS — Z1211 Encounter for screening for malignant neoplasm of colon: Secondary | ICD-10-CM

## 2012-09-02 DIAGNOSIS — Z86010 Personal history of colonic polyps: Secondary | ICD-10-CM

## 2012-09-02 MED ORDER — NA SULFATE-K SULFATE-MG SULF 17.5-3.13-1.6 GM/177ML PO SOLN: 1.0000 | Freq: Once | ORAL | Status: DC

## 2012-09-02 NOTE — Progress Notes (Signed)
No egg or soy allergy. ewm No problems with past sedations. ewm Dr Leone Payor did last procedures. ewm No home 02 use. ewm

## 2012-09-05 ENCOUNTER — Other Ambulatory Visit: Payer: Self-pay

## 2012-09-05 MED ORDER — TOPIRAMATE 50 MG PO TABS: 50.0000 mg | ORAL_TABLET | Freq: Two times a day (BID) | ORAL | Status: DC

## 2012-09-12 ENCOUNTER — Other Ambulatory Visit: Payer: Self-pay

## 2012-09-12 MED ORDER — LAMOTRIGINE 25 MG PO TABS: 75.0000 mg | ORAL_TABLET | Freq: Two times a day (BID) | ORAL | Status: DC

## 2012-09-15 ENCOUNTER — Telehealth: Payer: Self-pay | Admitting: *Deleted

## 2012-09-15 NOTE — Telephone Encounter (Signed)
I called patient. The patient is doing better with Topamax at 25 mg twice daily, and he is only on Lamictal 25 mg twice daily. The patient has had elimitation of his pain with the addition of vitamin C. The patient will continue on this regimen.

## 2012-09-15 NOTE — Telephone Encounter (Signed)
Patient called stating he has dropped his topamax down to 25 mg twice daily. Patient stated he see no need to take the 50 mg twice daily because the 25 mg works. Patient would like to speak with physician.

## 2012-09-17 ENCOUNTER — Telehealth: Payer: Self-pay | Admitting: Neurology

## 2012-09-17 MED ORDER — TOPIRAMATE 25 MG PO CPSP: 25.0000 mg | ORAL_CAPSULE | Freq: Two times a day (BID) | ORAL | Status: DC

## 2012-09-17 NOTE — Telephone Encounter (Signed)
Rx sent 

## 2012-09-20 ENCOUNTER — Encounter: Payer: Self-pay | Admitting: Internal Medicine

## 2012-09-20 ENCOUNTER — Ambulatory Visit (AMBULATORY_SURGERY_CENTER): Payer: Medicare Other | Admitting: Internal Medicine

## 2012-09-20 VITALS — BP 114/65 | HR 61 | Temp 97.3°F | Resp 26 | Ht 72.0 in | Wt 221.0 lb

## 2012-09-20 DIAGNOSIS — D126 Benign neoplasm of colon, unspecified: Secondary | ICD-10-CM

## 2012-09-20 DIAGNOSIS — Z86010 Personal history of colonic polyps: Secondary | ICD-10-CM

## 2012-09-20 DIAGNOSIS — K5730 Diverticulosis of large intestine without perforation or abscess without bleeding: Secondary | ICD-10-CM | POA: Diagnosis not present

## 2012-09-20 DIAGNOSIS — I10 Essential (primary) hypertension: Secondary | ICD-10-CM | POA: Diagnosis not present

## 2012-09-20 DIAGNOSIS — Z1211 Encounter for screening for malignant neoplasm of colon: Secondary | ICD-10-CM

## 2012-09-20 DIAGNOSIS — K644 Residual hemorrhoidal skin tags: Secondary | ICD-10-CM

## 2012-09-20 DIAGNOSIS — K648 Other hemorrhoids: Secondary | ICD-10-CM

## 2012-09-20 MED ORDER — SODIUM CHLORIDE 0.9 % IV SOLN: 500.0000 mL | INTRAVENOUS | Status: DC

## 2012-09-20 NOTE — Op Note (Signed)
[facility] [address] [place], [address]   COLONOSCOPY PROCEDURE REPORT  PATIENT: Christian Ross, Christian Ross  MR#: [id number] BIRTHDATE: 01/09/1941 , 72  yrs. old GENDER: Male ENDOSCOPIST: Iva Boop, MD, [facility] PROCEDURE DATE:  09/20/2012 PROCEDURE:   Colonoscopy with biopsy and snare polypectomy ASA CLASS:   Class II INDICATIONS:Screening and surveillance,personal history of colonic polyps. MEDICATIONS: propofol (Diprivan) 200mg  IV  DESCRIPTION OF PROCEDURE:   After the risks benefits and alternatives of the procedure were thoroughly explained, informed consent was obtained.  A digital rectal exam revealed no abnormalities of the rectum, A digital rectal exam revealed no prostatic nodules, and A digital rectal exam revealed the prostate was not enlarged.   The [id number]  endoscope was introduced through the anus and advanced to the cecum, which was identified by both the appendix and ileocecal valve. No adverse events experienced.   The quality of the prep was excellent using Suprep  The instrument was then slowly withdrawn as the colon was fully examined.      COLON FINDINGS: Two sessile polyps measuring 2 and 5 mm in size were found at the appendiceal orifice.  A polypectomy was performed with cold forceps (2mm polyp) and with a cold snare (5mm polyp).  The resection was complete and the polyp tissue was completely retrieved.   Moderate diverticulosis was noted in the sigmoid colon.   The colon mucosa was otherwise normal.   A right colon retroflexion was performed.  Retroflexed views revealed internal/external hemorrhoids and an anal tag. The time to cecum=1 minutes 29 seconds.  Withdrawal time=14 minutes 18 seconds.  The scope was withdrawn and the procedure completed. COMPLICATIONS: There were no complications.  ENDOSCOPIC IMPRESSION: 1.   Two sessile polyps measuring 2 and 5 mm in size were found at the appendiceal orifice; polypectomy  was performed with cold forceps and with a cold snare 2.   Moderate diverticulosis was noted in the sigmoid colon 3.   The colon mucosa was otherwise normal - excellent prep 4.   External hemorrhoids and anal tag 5.   Internal hemorrhoids  RECOMMENDATIONS: Timing of repeat colonoscopy will be determined by pathology findings in a patient w/ advanced adenoma and other adenomas 2005, no polyps 2008.  eSigned:  Iva Boop, MD, [facility] 09/20/2012 9:42 AM   cc: The Patient  and Farris Has, MD

## 2012-09-20 NOTE — Patient Instructions (Addendum)
Two small polyps were seen and removed.  You also have diverticulosis and hemorrhoids as before.  I will let you know pathology results and when to have another routine colonoscopy by mail.  I appreciate the opportunity to care for you. Iva Boop, MD, FACG  YOU HAD AN ENDOSCOPIC PROCEDURE TODAY AT THE [facility]: Refer to the procedure report that was given to you for any specific questions about what was found during the examination.  If the procedure report does not answer your questions, please call your gastroenterologist to clarify.  If you requested that your care partner not be given the details of your procedure findings, then the procedure report has been included in a sealed envelope for you to review at your convenience later.  YOU SHOULD EXPECT: Some feelings of bloating in the abdomen. Passage of more gas than usual.  Walking can help get rid of the air that was put into your GI tract during the procedure and reduce the bloating. If you had a lower endoscopy (such as a colonoscopy or flexible sigmoidoscopy) you may notice spotting of blood in your stool or on the toilet paper. If you underwent a bowel prep for your procedure, then you may not have a normal bowel movement for a few days.  DIET: Your first meal following the procedure should be a light meal and then it is ok to progress to your normal diet.  A half-sandwich or bowl of soup is an example of a good first meal.  Heavy or fried foods are harder to digest and may make you feel nauseous or bloated.  Likewise meals heavy in dairy and vegetables can cause extra gas to form and this can also increase the bloating.  Drink plenty of fluids but you should avoid alcoholic beverages for 24 hours.  ACTIVITY: Your care partner should take you home directly after the procedure.  You should plan to take it easy, moving slowly for the rest of the day.  You can resume normal activity the day after the procedure however you  should NOT DRIVE or use heavy machinery for 24 hours (because of the sedation medicines used during the test).    SYMPTOMS TO REPORT IMMEDIATELY: A gastroenterologist can be reached at any hour.  During normal business hours, 8:30 AM to 5:00 PM Monday through Friday, call [phone].  After hours and on weekends, please call the GI answering service at [phone] who will take a message and have the physician on call contact you.   Following lower endoscopy (colonoscopy or flexible sigmoidoscopy):  Excessive amounts of blood in the stool  Significant tenderness or worsening of abdominal pains  Swelling of the abdomen that is new, acute  Fever of 100F or higher  FOLLOW UP: If any biopsies were taken you will be contacted by phone or by letter within the next 1-3 weeks.  Call your gastroenterologist if you have not heard about the biopsies in 3 weeks.  Our staff will call the home number listed on your records the next business day following your procedure to check on you and address any questions or concerns that you may have at that time regarding the information given to you following your procedure. This is a courtesy call and so if there is no answer at the home number and we have not heard from you through the emergency physician on call, we will assume that you have returned to your regular daily activities without incident.  SIGNATURES/CONFIDENTIALITY:  You and/or your care partner have signed paperwork which will be entered into your electronic medical record.  These signatures attest to the fact that that the information above on your After Visit Summary has been reviewed and is understood.  Full responsibility of the confidentiality of this discharge information lies with you and/or your care-partner.

## 2012-09-20 NOTE — Progress Notes (Signed)
Stable to RR 

## 2012-09-20 NOTE — Progress Notes (Signed)
Called to room to assist during endoscopic procedure.  Patient ID and intended procedure confirmed with present staff. Received instructions for my participation in the procedure from the performing physician.  

## 2012-09-20 NOTE — Progress Notes (Signed)
Patient did not have preoperative order for IV antibiotic SSI prophylaxis. (G8918)  Patient did not experience any of the following events: a burn prior to discharge; a fall within the facility; wrong site/side/patient/procedure/implant event; or a hospital transfer or hospital admission upon discharge from the facility. (G8907)  

## 2012-09-21 ENCOUNTER — Telehealth: Payer: Self-pay

## 2012-09-21 NOTE — Telephone Encounter (Signed)
No answer;no voice mail to leave a message for follow up call from procedure Monday

## 2012-09-27 ENCOUNTER — Encounter: Payer: Self-pay | Admitting: Internal Medicine

## 2012-09-27 NOTE — Progress Notes (Signed)
Quick Note:  Diminutive adenoma and a lymphoid polyp Repeat colon about 09/2017 ______

## 2012-10-12 ENCOUNTER — Other Ambulatory Visit: Payer: Self-pay

## 2012-10-12 MED ORDER — VALSARTAN 160 MG PO TABS: 160.0000 mg | ORAL_TABLET | Freq: Every evening | ORAL | Status: DC

## 2012-10-12 MED ORDER — METOPROLOL TARTRATE 25 MG PO TABS: 25.0000 mg | ORAL_TABLET | Freq: Two times a day (BID) | ORAL | Status: DC

## 2012-10-12 MED ORDER — FENOFIBRATE 145 MG PO TABS: 145.0000 mg | ORAL_TABLET | Freq: Every evening | ORAL | Status: DC

## 2012-10-15 ENCOUNTER — Encounter: Payer: Self-pay | Admitting: Cardiovascular Disease

## 2012-11-09 DIAGNOSIS — Z23 Encounter for immunization: Secondary | ICD-10-CM | POA: Diagnosis not present

## 2012-11-12 ENCOUNTER — Telehealth: Payer: Self-pay | Admitting: Neurology

## 2012-11-12 MED ORDER — TOPIRAMATE 25 MG PO CPSP: 75.0000 mg | ORAL_CAPSULE | Freq: Two times a day (BID) | ORAL | Status: DC

## 2012-11-12 NOTE — Telephone Encounter (Signed)
Pt called is wanting someone to call him concerning his medication topiramate (TOPAMAX) 25 MG capsule. Thanks

## 2012-11-12 NOTE — Telephone Encounter (Signed)
I spoke with the patient.  He says he has increased his Topamax dose from 1 bid to 3 bid.  Says he would like his Rx updated and sent to [facility] For 90 days, and he would prefer 3 refills.  York Spaniel he has spoken with the provider in the past about his dose fluctuating, and this dose works best for him.  Dr Anne Hahn, okay to change dose?  Please advise.  Thank you.

## 2012-11-12 NOTE — Telephone Encounter (Signed)
I called patient. The patient has gone back up on the Topamax taking 75 mg twice daily as the pain has come back some. The patient is doing quite well on this dose at this point. I'll call in a prescription.

## 2012-11-16 ENCOUNTER — Telehealth: Payer: Self-pay | Admitting: Neurology

## 2012-11-16 MED ORDER — TOPIRAMATE 25 MG PO CPSP: 75.0000 mg | ORAL_CAPSULE | Freq: Two times a day (BID) | ORAL | Status: DC

## 2012-11-16 NOTE — Telephone Encounter (Signed)
Rx has been sent  

## 2012-12-30 DIAGNOSIS — L570 Actinic keratosis: Secondary | ICD-10-CM | POA: Diagnosis not present

## 2012-12-30 DIAGNOSIS — L821 Other seborrheic keratosis: Secondary | ICD-10-CM | POA: Diagnosis not present

## 2012-12-30 DIAGNOSIS — Z85828 Personal history of other malignant neoplasm of skin: Secondary | ICD-10-CM | POA: Diagnosis not present

## 2012-12-30 DIAGNOSIS — D239 Other benign neoplasm of skin, unspecified: Secondary | ICD-10-CM | POA: Diagnosis not present

## 2012-12-30 DIAGNOSIS — L259 Unspecified contact dermatitis, unspecified cause: Secondary | ICD-10-CM | POA: Diagnosis not present

## 2013-01-03 ENCOUNTER — Ambulatory Visit (INDEPENDENT_AMBULATORY_CARE_PROVIDER_SITE_OTHER): Payer: Medicare Other | Admitting: Cardiovascular Disease

## 2013-01-03 ENCOUNTER — Encounter: Payer: Self-pay | Admitting: Cardiovascular Disease

## 2013-01-03 ENCOUNTER — Telehealth: Payer: Self-pay | Admitting: Cardiovascular Disease

## 2013-01-03 VITALS — BP 132/86 | HR 94 | Ht 72.0 in | Wt 216.8 lb

## 2013-01-03 DIAGNOSIS — I10 Essential (primary) hypertension: Secondary | ICD-10-CM

## 2013-01-03 DIAGNOSIS — R7309 Other abnormal glucose: Secondary | ICD-10-CM | POA: Diagnosis not present

## 2013-01-03 DIAGNOSIS — E785 Hyperlipidemia, unspecified: Secondary | ICD-10-CM | POA: Diagnosis not present

## 2013-01-03 DIAGNOSIS — R739 Hyperglycemia, unspecified: Secondary | ICD-10-CM

## 2013-01-03 LAB — LIPID PANEL
HDL: 31.5 mg/dL — ABNORMAL LOW (ref >39.00)
VLDL: 58.2 mg/dL — ABNORMAL HIGH (ref 0.0–40.0)

## 2013-01-03 LAB — BASIC METABOLIC PANEL
BUN: 17 mg/dL (ref 6–23)
CO2: 24 mEq/L (ref 19–32)
Calcium: 9.3 mg/dL (ref 8.4–10.5)
GFR: 53.64 mL/min — ABNORMAL LOW (ref >60.00)
Glucose, Bld: 227 mg/dL — ABNORMAL HIGH (ref 70–99)
Sodium: 138 mEq/L (ref 135–145)

## 2013-01-03 LAB — HEPATIC FUNCTION PANEL
ALT: 35 U/L (ref 0–53)
Alkaline Phosphatase: 48 U/L (ref 39–117)
Bilirubin, Direct: 0 mg/dL (ref 0.0–0.3)
Total Bilirubin: 0.5 mg/dL (ref 0.3–1.2)

## 2013-01-03 MED ORDER — METOPROLOL TARTRATE 50 MG PO TABS: 50.0000 mg | ORAL_TABLET | Freq: Two times a day (BID) | ORAL | Status: DC

## 2013-01-03 NOTE — Telephone Encounter (Signed)
New Problem  Pt states he made a mistake for fasting// pt had eaten some cereal before visit/// believes this may have messed the blood work up.Marland Kitchen

## 2013-01-03 NOTE — Patient Instructions (Addendum)
Your physician wants you to follow-up in: 13 months You will receive a reminder letter in the mail two months in advance. If you don't receive a letter, please call our office to schedule the follow-up appointment.   Your physician has recommended you make the following change in your medication:  Increase metoprolol to 50 mg twice daily.   Your physician recommends that you return for lab work in: today, bmet lipid liver a1c

## 2013-01-03 NOTE — Telephone Encounter (Signed)
raisin bran with 2% milk, pt didn't have anything else, pt told it will still be accurate and A1C will be ok, glucose will be elevated for today.

## 2013-01-03 NOTE — Assessment & Plan Note (Signed)
Well controlled.  Continue current meds.    I will see him in 12 months.

## 2013-01-03 NOTE — Progress Notes (Signed)
Christian Ross Date of Birth  [birth year] [facility]     [place] [facility]  [address]    [address]   [address] [place], [place]  [address]    [place], [place]  [address] [phone]  Fax  [phone]  [phone]  Fax [phone]  1. Hyperlipidemia 2.Hypertension  History of Present Illness:  Christian Ross seems to be doing well.  He continues to have elevations in his triglyceride levels.  He is tolerating the crestor now.    Sept. 15, 2014:   Still active flying, not exercising as much as he should.  No CP or dyspnea.  Current Outpatient Prescriptions on File Prior to Visit  Medication Sig Dispense Refill  . Ascorbic Acid (VITAMIN C) 1000 MG tablet Take 2,000 mg by mouth daily.      Marland Kitchen aspirin 81 MG tablet Take 81 mg by mouth every evening.       . Berberine Chloride POWD Take 400 mg by mouth daily.      Revonda Humphrey 500 MG CAPS Take 2 capsules by mouth daily.      . fenofibrate (TRICOR) 145 MG tablet Take 1 tablet (145 mg total) by mouth every evening.  90 tablet  0  . lamoTRIgine (LAMICTAL) 25 MG tablet Take 25 mg by mouth 2 (two) times daily.      . Lutein 15-0.7 MG CAPS Take 1 capsule by mouth daily.      . LUTEIN-ZEAXANTHIN PO Take 2 mg by mouth daily.      Marland Kitchen LYCOPENE PO Take 3 mg by mouth daily.      . metoprolol tartrate (LOPRESSOR) 25 MG tablet Take 1 tablet (25 mg total) by mouth 2 (two) times daily.  180 tablet  0  . omega-3 acid ethyl esters (LOVAZA) 1 G capsule Take 1 g by mouth 3 (three) times daily.      . rosuvastatin (CRESTOR) 10 MG tablet Take 10 mg by mouth every evening.       Marland Kitchen TAURINE PO Take 600 mg by mouth daily.      Marland Kitchen topiramate (TOPAMAX) 25 MG capsule Take 3 capsules (75 mg total) by mouth 2 (two) times daily.  180 capsule  1  . valsartan (DIOVAN) 160 MG tablet Take 1 tablet (160 mg total) by mouth every evening.  90 tablet  0  . [DISCONTINUED] irbesartan (AVAPRO) 150 MG tablet Take 1 tablet (150 mg total) by mouth daily.  90 tablet   3   No current facility-administered medications on file prior to visit.    Allergies  Allergen Reactions  . Lisinopril Cough    Causes a cough    Past Medical History  Diagnosis Date  . Hyperlipidemia   . Vitamin D deficiency   . Hypertension     orthostatic  . Hypertriglyceridemia   . Insulin resistance   . Erectile dysfunction   . Hemorrhoids   . Inguinal hernia   . Hx of colonic polyps   . Diverticulitis   . Diverticulosis   . Cancer     skin pre cancer nose    Past Surgical History  Procedure Laterality Date  . Knee surgery  1960    right  . 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 Colonoscopy w/ biopsies and polypectomy  multiple, 2005, 08, 14  . Nasal sinus surgery    . Rectal biopsy      History  Smoking status  . Never Smoker   Smokeless tobacco  .  Never Used    History  Alcohol Use No    Comment: rarely    Family History  Problem Relation Age of Onset  . [place] cancer Neg Hx     Reviw of Systems:  Reviewed in the HPI.  All other systems are negative.  Physical Exam: Blood pressure 132/86, pulse 94, height 6' (1.829 m), weight 216 lb 12.8 oz (98.34 kg). General: Well developed, well nourished, in no acute distress.  Head: Normocephalic, atraumatic, sclera non-icteric, mucus membranes are moist,   Neck: Supple. Negative for carotid bruits. JVD not elevated.  Lungs: Clear bilaterally to auscultation without wheezes, rales, or rhonchi. Breathing is unlabored.  Heart: RRR with S1 S2. No murmurs, rubs, or gallops appreciated.  Abdomen: Soft, non-tender, non-distended with normoactive bowel sounds. No hepatomegaly. No rebound/guarding. No obvious abdominal masses.  Msk:  Strength and tone appear normal for age.  Extremities: No clubbing or cyanosis. No edema.  Distal pedal pulses are 2+ and equal bilaterally.  Neuro: Alert and oriented X 3. Moves all extremities spontaneously.  Psych:  Responds to questions appropriately with a normal  affect.  ECG: Sept. 15, 2014:  NSR at 94 , NS ST / T wave changes  Assessment / Plan:

## 2013-01-10 ENCOUNTER — Telehealth: Payer: Self-pay | Admitting: Neurology

## 2013-01-13 NOTE — Telephone Encounter (Signed)
I called patient. 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 All 3 medications recommended for SUNCT headaches which represent gabapentin, Lamictal, and Topamax are antiepileptic medications. The medications used for migraine are not effective for this type of headache.

## 2013-01-16 ENCOUNTER — Other Ambulatory Visit: Payer: Self-pay | Admitting: Cardiovascular Disease

## 2013-01-27 ENCOUNTER — Other Ambulatory Visit: Payer: Self-pay | Admitting: Neurology

## 2013-01-31 ENCOUNTER — Other Ambulatory Visit: Payer: Self-pay

## 2013-01-31 MED ORDER — TOPIRAMATE 25 MG PO CPSP: 75.0000 mg | ORAL_CAPSULE | Freq: Two times a day (BID) | ORAL | Status: DC

## 2013-01-31 NOTE — Telephone Encounter (Signed)
[facility] sent a fax requesting refills on Topamax.  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  The medication is still on the active drug list.  I called the patient at home to clarfiy, he was not available, but Ms Behnken said he is still taking the medication.

## 2013-02-15 DIAGNOSIS — R079 Chest pain, unspecified: Secondary | ICD-10-CM | POA: Diagnosis not present

## 2013-03-23 DIAGNOSIS — E119 Type 2 diabetes mellitus without complications: Secondary | ICD-10-CM | POA: Diagnosis not present

## 2013-03-25 DIAGNOSIS — E782 Mixed hyperlipidemia: Secondary | ICD-10-CM | POA: Diagnosis not present

## 2013-03-25 DIAGNOSIS — I10 Essential (primary) hypertension: Secondary | ICD-10-CM | POA: Diagnosis not present

## 2013-03-25 DIAGNOSIS — E119 Type 2 diabetes mellitus without complications: Secondary | ICD-10-CM | POA: Diagnosis not present

## 2013-03-29 ENCOUNTER — Telehealth: Payer: Self-pay | Admitting: Neurology

## 2013-03-29 DIAGNOSIS — H01029 Squamous blepharitis unspecified eye, unspecified eyelid: Secondary | ICD-10-CM | POA: Diagnosis not present

## 2013-03-29 DIAGNOSIS — H2510 Age-related nuclear cataract, unspecified eye: Secondary | ICD-10-CM | POA: Diagnosis not present

## 2013-03-29 NOTE — Telephone Encounter (Signed)
Report noted, I did not call the patient.

## 2013-04-01 ENCOUNTER — Telehealth: Payer: Self-pay | Admitting: Neurology

## 2013-04-01 MED ORDER — LAMOTRIGINE 25 MG PO TABS: 75.0000 mg | ORAL_TABLET | Freq: Two times a day (BID) | ORAL | Status: DC

## 2013-04-01 NOTE — Telephone Encounter (Signed)
I called patient. The patient is on Lamictal taking 75 mg twice daily, and Topamax taking 75 mg twice daily. 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 I will call in a refill for the Lamictal, he has enough Topamax for now. I'll get a revisit with a NP.

## 2013-04-01 NOTE — Telephone Encounter (Signed)
NEEDS RX CALLED IN FOR LAMOTRIGINE--[facility]--NEEDS EMERGENCY SUPPLY CALLED TO [facility] UNTIL RECEIVES RX FROM [employer] [facility].

## 2013-04-01 NOTE — Telephone Encounter (Signed)
Called patient and he stated that he is running out of lamotrigine and needs a refill for 3 mo. Patient also states that office told him that he needed to make an appt. Before anymore refills because he has not seen Dr. Anne Hahn in a year. Has an appt. In June. Please advise.

## 2013-04-01 NOTE — Telephone Encounter (Signed)
Patient has not been seen since January and will need to schedule an appt.  Also, phone note from May says:  York Spaniel, MD at 09/15/2012 5:42 PM     Status: Signed        I called patient. The patient is doing better with Topamax at 25 mg twice daily, and he is only on Lamictal 25 mg twice daily. The patient has had elimitation of his pain with the addition of vitamin C. The patient will continue on this regimen.   I called the patient at home, got no answer.  LM.  Called cell, spoke with the patient.  He said he has been taking 25mg  tabs three BID (not one bid). He now needs his refill sent to local pharm and mail order.  York Spaniel he will call the office to schedule an appt.  Told him I will send message to provider, and if he approves Rx, we will make sure he gets enough refills to last until he is seen.  Patient verbalized understanding.  Rx's can be sent directly, no call back needed unless Rx is not approved.

## 2013-04-01 NOTE — Telephone Encounter (Signed)
Patient called to add that he needs 3 refills called in of Lamotrigine to [facility].

## 2013-04-02 MED ORDER — LAMOTRIGINE 25 MG PO TABS: 75.0000 mg | ORAL_TABLET | Freq: Two times a day (BID) | ORAL | Status: DC

## 2013-04-07 ENCOUNTER — Encounter: Payer: Self-pay | Admitting: Nurse Practitioner

## 2013-04-07 ENCOUNTER — Ambulatory Visit (INDEPENDENT_AMBULATORY_CARE_PROVIDER_SITE_OTHER): Payer: Medicare Other | Admitting: Nurse Practitioner

## 2013-04-07 ENCOUNTER — Encounter (INDEPENDENT_AMBULATORY_CARE_PROVIDER_SITE_OTHER): Payer: Self-pay

## 2013-04-07 VITALS — BP 117/78 | HR 74 | Ht 72.0 in | Wt 211.0 lb

## 2013-04-07 DIAGNOSIS — G44059 Short lasting unilateral neuralgiform headache with conjunctival injection and tearing (SUNCT), not intractable: Secondary | ICD-10-CM | POA: Insufficient documentation

## 2013-04-07 MED ORDER — TOPIRAMATE 25 MG PO CPSP: 75.0000 mg | ORAL_CAPSULE | Freq: Two times a day (BID) | ORAL | Status: DC

## 2013-04-07 MED ORDER — LAMOTRIGINE 25 MG PO TABS
75.0000 mg | ORAL_TABLET | Freq: Two times a day (BID) | ORAL | Status: DC
Start: 2013-04-07 — End: 2013-04-10

## 2013-04-07 NOTE — Progress Notes (Signed)
PATIENT: Christian Ross DOB: 1941/03/12   REASON FOR VISIT: routine follow up HISTORY FROM: patient  Chief Complaint: Headache   HPI: 05/14/12 Christian Ross):  Christian Ross is a 72 year old right-handed white male with a history of a long duration area of hypersensitivity on the left forehead that began about 3-1/2 years ago. Within the last 2 weeks, the patient began having severe headache pains around the left eye and forehead. The patient has burning sharp boring pains associated with tearing of the eye and running of the nose. The patient indicates that the pain will go down into the left cheek area, and he can activate headaches by light touch on the forehead. The patient indicates that the headaches may last 1 to 2 minutes, and then go away, but episodes will come on frequently throughout the day. The headaches will come on every 5-10 minutes. The patient has taken oxycodone, and hydromorphone without significant benefit. The patient initially was told that he had shingles. The patient was placed on indomethacin at 25 mg 3 times daily and he is on prednisone 60 mg a day without benefit. These medications were started one day ago. The patient may indicate that chewing and talking will bring on the headaches. The patient denies any numbness or weakness of the face, arms, or legs. The patient denies any photophobia or phonophobia or nausea. The patient denies any troubles with balance. The patient has reported some constipation. The patient comes to this office for an evaluation.  04/07/13 (LL):  Christian Ross for a follow up visit for SUNCT headaches.  He states that once he increased to his current dose of Lamictal and Topamax, he has not had any headaches since.  He is doing very well, with no noted side effects from either medication.  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  Review of Systems  Out of a complete 14 system review, the patient complains of only  the following symptoms, and all other reviewed systems are negative.  (All negative).    ALLERGIES: Allergies  Allergen Reactions  . Lisinopril Cough    Causes a cough    HOME MEDICATIONS: Outpatient Prescriptions Prior to Visit  Medication Sig Dispense Refill  . Ascorbic Acid (VITAMIN C) 1000 MG tablet Take 2,000 mg by mouth daily.      Marland Kitchen aspirin 81 MG tablet Take 81 mg by mouth every evening.       . Berberine Chloride POWD Take 400 mg by mouth daily.      . fenofibrate (TRICOR) 145 MG tablet Take 1 tablet (145 mg total) by mouth every evening.  90 tablet  0  . Lutein 15-0.7 MG CAPS Take 1 capsule by mouth daily.      . LUTEIN-ZEAXANTHIN PO Take 2 mg by mouth daily.      Marland Kitchen LYCOPENE PO Take 3 mg by mouth daily.      . metoprolol (LOPRESSOR) 50 MG tablet Take 1 tablet (50 mg total) by mouth 2 (two) times daily.  180 tablet  3  . omega-3 acid ethyl esters (LOVAZA) 1 G capsule Take 1 g by mouth 3 (three) times daily.      . rosuvastatin (CRESTOR) 10 MG tablet Take 10 mg by mouth every evening.       Marland Kitchen TAURINE PO Take 600 mg by mouth daily.      . valsartan (DIOVAN) 160 MG tablet Take 1 tablet (160 mg total) by mouth daily.  90  tablet  3  . lamoTRIgine (LAMICTAL) 25 MG tablet Take 3 tablets (75 mg total) by mouth 2 (two) times daily.  180 tablet  1  . topiramate (TOPAMAX) 25 MG capsule Take 3 capsules (75 mg total) by mouth 2 (two) times daily.  540 capsule  1  . [place] 500 MG CAPS Take 2 capsules by mouth daily.       No facility-administered medications prior to visit.    PAST MEDICAL HISTORY: Past Medical History  Diagnosis Date  . Hyperlipidemia   . Vitamin D deficiency   . Hypertension     orthostatic  . Hypertriglyceridemia   . Insulin resistance   . Erectile dysfunction   . Hemorrhoids   . Inguinal hernia   . Hx of colonic polyps   . Diverticulitis   . Diverticulosis   . Cancer     skin pre cancer nose    PAST SURGICAL HISTORY: Past Surgical History    Procedure Laterality Date  . Knee surgery  1960    right  . 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 Colonoscopy w/ biopsies and polypectomy  multiple, 2005, 08, 14  . Nasal sinus surgery    . Rectal biopsy      FAMILY HISTORY: Family History  Problem Relation Age of Onset  . [place] cancer Neg Hx     SOCIAL HISTORY: History   Social History  . Marital Status: Married    Spouse Name: N/A    Number of Children: 2  . Years of Education: college   Occupational History  . Pilot   .     Social History Main Topics  . Smoking status: Never Smoker   . Smokeless tobacco: Never Used  . Alcohol Use: Yes     Comment: rarely  . Drug Use: No  . Sexual Activity: No   Other Topics Concern  . Not on file   Social History Narrative   1-2 caffeine drinks daily    PHYSICAL EXAM  Filed Vitals:   04/07/13 0908  BP: 117/78  Pulse: 74  Height: 6' (1.829 m)  Weight: 211 lb (95.709 kg)   Body mass index is 28.61 kg/(m^2).  General: Patient is alert and cooperative at the time of the examination. The patient is minimally overweight. Head: Pupils are minimally anisocoric, with the left pupil being slightly smaller.   Neck: Neck is supple, no carotid bruits noted. Respiratory: Respiratory examination is clear. Cardiovascular: Cardiovascular examination reveals a regular rate and rhythm, no obvious murmurs or rubs noted. Skin: Extremities are without significant edema.  Neurologic Exam  Cranial Nerves: Facial symmetry is present.  Good sensation of the face to pinprick and soft touch bilaterally.  Strength of the facial muscles and the muscles to head turning and shoulder shrug are normal bilaterally.  Speech is well enunciated, no aphasia or dysarthria is noted.  Extraocular movements are full.  Visual fields are full. Motor: Motor testing reveals 5 over 5 strength of all four extremities.  Good symmetric motor tone is noted throughout. Sensory: Sensory testing is intact to pinprick, soft  touch, vibratory sensation and position sense in all 4 extremities.  No evidence of extinction is noted. Coordination: Cerebellar testing reveals good finger-to-nose and heel-to-shin bilaterally. Gait and Station: Gait is normal.  Tandem gait is normal.  Romberg is negative.  No drift is seen. Reflexes: Deep tendon reflexes are symmetric and normal bilaterally.  Toes are downgoing bilaterally . DIAGNOSTIC DATA (LABS, IMAGING, TESTING) - I reviewed patient  records, labs, notes, testing and imaging myself where available.  Lab Results  Component Value Date   WBC 11.0* 10/15/2010   HGB 13.4 10/15/2010   HCT 40.3 10/15/2010   MCV 95.5 10/15/2010   PLT 252 10/15/2010      Component Value Date/Time   NA 138 01/03/2013 0914   K 4.3 01/03/2013 0914   CL 109 01/03/2013 0914   CO2 24 01/03/2013 0914   GLUCOSE 227* 01/03/2013 0914   BUN 17 01/03/2013 0914   CREATININE 1.4 01/03/2013 0914   CALCIUM 9.3 01/03/2013 0914   PROT 7.5 01/03/2013 0914   ALBUMIN 4.0 01/03/2013 0914   AST 29 01/03/2013 0914   ALT 35 01/03/2013 0914   ALKPHOS 48 01/03/2013 0914   BILITOT 0.5 01/03/2013 0914   GFRNONAA 54* 10/15/2010 2025   GFRAA >60 10/15/2010 2025   Lab Results  Component Value Date   CHOL 167 01/03/2013   HDL 31.50* 01/03/2013   LDLDIRECT 96.4 01/03/2013   TRIG 291.0* 01/03/2013   CHOLHDL 5 01/03/2013   Lab Results  Component Value Date   HGBA1C 7.3* 01/03/2013   05/18/12 Abnormal MRI brain (without contrast) demonstrating: 1. Mild scattered periventricular, subcortical and pontine foci of chronic small vessel ischemic disease. 2. Mild perisylvian atrophy.  05/18/12 Normal MRA head (without contrast). 05/25/12 Carotid Doppler Study negative.  ASSESSMENT AND PLAN 72 y.o. year old male  has a past medical history of Hyperlipidemia; Vitamin D deficiency; Hypertension; Hypertriglyceridemia; Insulin resistance; Erectile dysfunction; Hemorrhoids; Inguinal hernia; colonic polyps; Diverticulitis; Diverticulosis; and  Cancer here for follow up of headaches.  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 These headaches are quite rare, but the patient appears to have all the hallmarks for this headache syndrome. The treatment of choice is Lamictal, but Topamax and gabapentin can also be used as secondary medications. The patient will be maintained on Lamictal and Topamax, as these have relieved his headaches completely with no side effects. 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 His license was revoked, but he is appealing this decision.  PLAN: 1. Continue Lamictal and Topamax for headache prevention. 2. Follow up in 1 year.  Meds ordered this encounter  Medications  . topiramate (TOPAMAX) 25 MG capsule    Sig: Take 3 capsules (75 mg total) by mouth 2 (two) times daily.    Dispense:  540 capsule    Refill:  3    Order Specific Question:  Supervising Provider    Answer:  Stephanie Acre K [[id number]]  . lamoTRIgine (LAMICTAL) 25 MG tablet    Sig: Take 3 tablets (75 mg total) by mouth 2 (two) times daily.    Dispense:  180 tablet    Refill:  3    Order Specific Question:  Supervising Provider    Answer:  York Spaniel [[id number]]   Return in about 1 year (around 04/07/2014).  Ronal Fear, MSN, NP-C 04/07/2013, 9:28 AM [facility] [address] [place], [place] [address] [phone]  Note: This document was prepared with digital dictation and possible smart phrase technology. Any transcriptional errors that result from this process are unintentional.

## 2013-04-07 NOTE — Patient Instructions (Signed)
PLAN: 1. Continue Lamictal and Topamax for headache prevention.  Refills sent to [facility], for 3 refills. 2. Follow up in 1 year, sooner as needed.

## 2013-04-10 ENCOUNTER — Other Ambulatory Visit: Payer: Self-pay

## 2013-04-10 MED ORDER — LAMOTRIGINE 25 MG PO TABS: 75.0000 mg | ORAL_TABLET | Freq: Two times a day (BID) | ORAL | Status: DC

## 2013-05-02 DIAGNOSIS — L570 Actinic keratosis: Secondary | ICD-10-CM | POA: Diagnosis not present

## 2013-05-05 ENCOUNTER — Telehealth: Payer: Self-pay | Admitting: Neurology

## 2013-05-05 MED ORDER — TOPIRAMATE 25 MG PO CPSP: 75.0000 mg | ORAL_CAPSULE | Freq: Two times a day (BID) | ORAL | Status: DC

## 2013-05-05 MED ORDER — LAMOTRIGINE 25 MG PO TABS
75.0000 mg | ORAL_TABLET | Freq: Two times a day (BID) | ORAL | Status: DC
Start: 2013-05-05 — End: 2014-01-26

## 2013-05-05 NOTE — Telephone Encounter (Signed)
Rx has been sent  

## 2013-06-06 ENCOUNTER — Encounter: Payer: Self-pay | Admitting: Neurology

## 2013-06-13 ENCOUNTER — Telehealth: Payer: Self-pay | Admitting: Nurse Practitioner

## 2013-06-13 NOTE — Telephone Encounter (Signed)
Patient is f/u on letter he brought to our office--once info has been gathered from our office patient will be glad to pick up and mail to [facility].

## 2013-06-14 NOTE — Telephone Encounter (Signed)
Spoke with patient to get more information about [employer] letter, informed that a form fee is usually required to be paid and the letter will be submitted to be completed from Ms Lam, will call when ready for pick up, patient verbalized understanding and said that he will be in to pay.

## 2013-06-15 ENCOUNTER — Encounter: Payer: Self-pay | Admitting: Nurse Practitioner

## 2013-06-15 NOTE — Telephone Encounter (Signed)
Letter is complete, must be printed and signed.  A copy os last office note must accompany the letter, then patient may pick up.

## 2013-06-15 NOTE — Telephone Encounter (Signed)
Patient has paid for the letter and is waiting for completion, call patient when letter is ready for pick up

## 2013-06-17 DIAGNOSIS — Z0289 Encounter for other administrative examinations: Secondary | ICD-10-CM

## 2013-06-20 DIAGNOSIS — E119 Type 2 diabetes mellitus without complications: Secondary | ICD-10-CM | POA: Diagnosis not present

## 2013-06-20 DIAGNOSIS — E782 Mixed hyperlipidemia: Secondary | ICD-10-CM | POA: Diagnosis not present

## 2013-06-20 DIAGNOSIS — Z79899 Other long term (current) drug therapy: Secondary | ICD-10-CM | POA: Diagnosis not present

## 2013-06-22 DIAGNOSIS — G44059 Short lasting unilateral neuralgiform headache with conjunctival injection and tearing (SUNCT), not intractable: Secondary | ICD-10-CM | POA: Diagnosis not present

## 2013-06-22 DIAGNOSIS — N183 Chronic kidney disease, stage 3 unspecified: Secondary | ICD-10-CM | POA: Diagnosis not present

## 2013-06-22 DIAGNOSIS — I10 Essential (primary) hypertension: Secondary | ICD-10-CM | POA: Diagnosis not present

## 2013-06-22 DIAGNOSIS — E782 Mixed hyperlipidemia: Secondary | ICD-10-CM | POA: Diagnosis not present

## 2013-06-22 DIAGNOSIS — E119 Type 2 diabetes mellitus without complications: Secondary | ICD-10-CM | POA: Diagnosis not present

## 2013-06-27 NOTE — Telephone Encounter (Signed)
Placed on  Ms Lam's desk  for her signature

## 2013-06-29 ENCOUNTER — Other Ambulatory Visit: Payer: Self-pay

## 2013-06-29 MED ORDER — FENOFIBRATE 145 MG PO TABS: 145.0000 mg | ORAL_TABLET | Freq: Every evening | ORAL | Status: DC

## 2013-09-20 DIAGNOSIS — E119 Type 2 diabetes mellitus without complications: Secondary | ICD-10-CM | POA: Diagnosis not present

## 2013-09-20 DIAGNOSIS — E782 Mixed hyperlipidemia: Secondary | ICD-10-CM | POA: Diagnosis not present

## 2013-09-22 DIAGNOSIS — E782 Mixed hyperlipidemia: Secondary | ICD-10-CM | POA: Diagnosis not present

## 2013-09-22 DIAGNOSIS — Z23 Encounter for immunization: Secondary | ICD-10-CM | POA: Diagnosis not present

## 2013-09-22 DIAGNOSIS — I10 Essential (primary) hypertension: Secondary | ICD-10-CM | POA: Diagnosis not present

## 2013-09-22 DIAGNOSIS — E119 Type 2 diabetes mellitus without complications: Secondary | ICD-10-CM | POA: Diagnosis not present

## 2013-09-22 DIAGNOSIS — N183 Chronic kidney disease, stage 3 unspecified: Secondary | ICD-10-CM | POA: Diagnosis not present

## 2013-10-17 ENCOUNTER — Ambulatory Visit: Payer: Self-pay | Admitting: Neurology

## 2013-11-14 ENCOUNTER — Telehealth: Payer: Self-pay | Admitting: Cardiovascular Disease

## 2013-11-14 DIAGNOSIS — R7309 Other abnormal glucose: Secondary | ICD-10-CM

## 2013-11-14 DIAGNOSIS — E785 Hyperlipidemia, unspecified: Secondary | ICD-10-CM

## 2013-11-14 DIAGNOSIS — R739 Hyperglycemia, unspecified: Secondary | ICD-10-CM

## 2013-11-14 MED ORDER — METOPROLOL TARTRATE 25 MG PO TABS: 25.0000 mg | ORAL_TABLET | Freq: Two times a day (BID) | ORAL | Status: DC

## 2013-11-14 NOTE — Telephone Encounter (Signed)
New message     Pt thinks metoprolol is making his bp too low.  He feels dizzy and the bp readings are low.  Please advise

## 2013-11-14 NOTE — Telephone Encounter (Signed)
Pt called because he said his BP has been low for a month. He  feels dizzy when he stands up. His BP has been running 81/56 to 108/65. HR has been running 64,67, 72, to 90 beats/minute.  today's BP is 87/55 HR 64. Pt thinks that Metoprolol has been making his BP low. Pt also takes Diovan 160 mg once daily. Dr. Mare Ferrari DOD aware and recommend for pt to decrease Metoprolol to 25 mg twice a day. Pt is aware , he verbalized understanding.

## 2013-12-06 ENCOUNTER — Other Ambulatory Visit: Payer: Self-pay | Admitting: Cardiovascular Disease

## 2014-01-06 ENCOUNTER — Other Ambulatory Visit: Payer: Self-pay | Admitting: *Deleted

## 2014-01-06 MED ORDER — METOPROLOL TARTRATE 25 MG PO TABS: 25.0000 mg | ORAL_TABLET | Freq: Two times a day (BID) | ORAL | Status: DC

## 2014-01-10 ENCOUNTER — Ambulatory Visit (INDEPENDENT_AMBULATORY_CARE_PROVIDER_SITE_OTHER): Payer: Medicare Other | Admitting: *Deleted

## 2014-01-10 DIAGNOSIS — E785 Hyperlipidemia, unspecified: Secondary | ICD-10-CM

## 2014-01-10 DIAGNOSIS — R7309 Other abnormal glucose: Secondary | ICD-10-CM | POA: Diagnosis not present

## 2014-01-10 DIAGNOSIS — I10 Essential (primary) hypertension: Secondary | ICD-10-CM | POA: Diagnosis not present

## 2014-01-10 LAB — HEMOGLOBIN A1C: Hgb A1c MFr Bld: 6.3 % (ref 4.6–6.5)

## 2014-01-10 LAB — HEPATIC FUNCTION PANEL
ALK PHOS: 38 U/L — AB (ref 39–117)
ALT: 31 U/L (ref 0–53)
AST: 31 U/L (ref 0–37)
Albumin: 3.8 g/dL (ref 3.5–5.2)
BILIRUBIN DIRECT: 0 mg/dL (ref 0.0–0.3)
BILIRUBIN TOTAL: 0.4 mg/dL (ref 0.2–1.2)
Total Protein: 7.8 g/dL (ref 6.0–8.3)

## 2014-01-10 LAB — LIPID PANEL
CHOLESTEROL: 148 mg/dL (ref 0–200)
HDL: 27.9 mg/dL — ABNORMAL LOW (ref >39.00)
LDL Cholesterol: 88 mg/dL (ref 0–99)
NonHDL: 120.1
Total CHOL/HDL Ratio: 5
Triglycerides: 163 mg/dL — ABNORMAL HIGH (ref 0.0–149.0)
VLDL: 32.6 mg/dL (ref 0.0–40.0)

## 2014-01-12 ENCOUNTER — Encounter: Payer: Self-pay | Admitting: Cardiovascular Disease

## 2014-01-12 ENCOUNTER — Ambulatory Visit (INDEPENDENT_AMBULATORY_CARE_PROVIDER_SITE_OTHER): Payer: Medicare Other | Admitting: Cardiovascular Disease

## 2014-01-12 VITALS — BP 128/76 | HR 72 | Ht 72.0 in | Wt 206.0 lb

## 2014-01-12 DIAGNOSIS — I10 Essential (primary) hypertension: Secondary | ICD-10-CM

## 2014-01-12 DIAGNOSIS — E785 Hyperlipidemia, unspecified: Secondary | ICD-10-CM

## 2014-01-12 NOTE — Patient Instructions (Signed)
Your physician recommends that you return for fasting labs in 1 year at next OV. (lipid, heaptic and bmet)  Your physician recommends that you continue on your current medications as directed. Please refer to the Current Medication list given to you today.  Your physician wants you to follow-up in: 1 year with Dr. Acie Fredrickson. You will receive a reminder letter in the mail two months in advance. If you don't receive a letter, please call our office to schedule the follow-up appointment.

## 2014-01-12 NOTE — Assessment & Plan Note (Signed)
His blood pressures been well controlled. Continue same medications.

## 2014-01-12 NOTE — Progress Notes (Signed)
Christian Ross Date of Birth  [birth year] [place]  [address]    [place]   [place], [place]  [address]    [place], [place]  [address] [phone]  Fax  [phone]  [phone]  Fax [phone]  1. Hyperlipidemia 2.Hypertension  History of Present Illness:  Christian Ross seems to be doing well.  He continues to have elevations in his triglyceride levels.  He is tolerating the crestor now.    Sept. 15, 2014:   Still active flying, not exercising as much as he should.  No CP or dyspnea.  Sept. 24, 2015:  Christian Ross is doing well.  Has lost 10 lbs since  I last saw him.  Exercising more.  HbA1C  Has improved.   He is no longer flying because he is on Lamactil.  .     Current Outpatient Prescriptions on File Prior to Visit  Medication Sig Dispense Refill  . Ascorbic Acid (VITAMIN C) 1000 MG tablet Take 2,000 mg by mouth daily.      Marland Kitchen aspirin 81 MG tablet Take 81 mg by mouth every evening.       . Berberine Chloride POWD Take 400 mg by mouth daily.      Marland Kitchen DIOVAN 160 MG tablet TAKE 1 TABLET DAILY  90 tablet  0  . fenofibrate (TRICOR) 145 MG tablet Take 1 tablet (145 mg total) by mouth every evening.  90 tablet  3  . lamoTRIgine (LAMICTAL) 25 MG tablet Take 3 tablets (75 mg total) by mouth 2 (two) times daily.  180 tablet  1  . Lutein 15-0.7 MG CAPS Take 1 capsule by mouth daily.      . metoprolol tartrate (LOPRESSOR) 25 MG tablet Take 1 tablet (25 mg total) by mouth 2 (two) times daily.  180 tablet  0  . omega-3 acid ethyl esters (LOVAZA) 1 G capsule Take 1 g by mouth 3 (three) times daily.      . rosuvastatin (CRESTOR) 10 MG tablet Take 10 mg by mouth every evening.       . topiramate (TOPAMAX) 25 MG capsule Take 3 capsules (75 mg total) by mouth 2 (two) times daily.  180 capsule  1  . [DISCONTINUED] irbesartan (AVAPRO) 150 MG tablet Take 1 tablet (150 mg total) by mouth daily.  90 tablet  3   No current facility-administered  medications on file prior to visit.    Allergies  Allergen Reactions  . Lisinopril Cough    Causes a cough    Past Medical History  Diagnosis Date  . Hyperlipidemia   . Vitamin D deficiency   . Hypertension     orthostatic  . Hypertriglyceridemia   . Insulin resistance   . Erectile dysfunction   . Hemorrhoids   . Inguinal hernia   . Hx of colonic polyps   . Diverticulitis   . Diverticulosis   . Cancer     skin pre cancer nose  . 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 Knee surgery  1960    right  . 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 Colonoscopy w/ biopsies and polypectomy  multiple, 2005, 08, 14  . Nasal sinus surgery    . Rectal biopsy      History  Smoking status  . Never Smoker   Smokeless tobacco  . Never Used    History  Alcohol Use  . Yes    Comment: rarely    Family History  Problem Relation Age of Onset  . Colon cancer Neg Hx     Reviw of Systems:  Reviewed in the HPI.  All other systems are negative.  Physical Exam: Blood pressure 128/76, pulse 72, height 6' (1.829 m), weight 206 lb (93.441 kg). General: Well developed, well nourished, in no acute distress.  Head: Normocephalic, atraumatic, sclera non-icteric, mucus membranes are moist,   Neck: Supple. Negative for carotid bruits. JVD not elevated.  Lungs: Clear bilaterally to auscultation without wheezes, rales, or rhonchi. Breathing is unlabored.  Heart: RRR with S1 S2. No murmurs, rubs, or gallops appreciated.  Abdomen: Soft, non-tender, non-distended with normoactive bowel sounds. No hepatomegaly. No rebound/guarding. No obvious abdominal masses.  Msk:  Strength and tone appear normal for age.  Extremities: No clubbing or cyanosis. No edema.  Distal pedal pulses are 2+ and equal bilaterally.  Neuro: Alert and oriented X 3. Moves all extremities spontaneously.  Psych:  Responds to questions appropriately with a normal  affect.  ECG: 01/12/2014: Normal sinus rhythm at 72. His EKG is normal.  Assessment / Plan:

## 2014-01-12 NOTE — Assessment & Plan Note (Signed)
Christian Ross was doing very well. His lipids are looking quite improved since last year. He's been exercising on a more regular basis.  I'll see him  one year. We'll get a basic metabolic profile, lipid profile, and hepatic profile. He also likes to have his hemoglobin A1c measured.

## 2014-01-18 DIAGNOSIS — I10 Essential (primary) hypertension: Secondary | ICD-10-CM | POA: Diagnosis not present

## 2014-01-18 DIAGNOSIS — N183 Chronic kidney disease, stage 3 unspecified: Secondary | ICD-10-CM | POA: Diagnosis not present

## 2014-01-18 DIAGNOSIS — E7800 Pure hypercholesterolemia, unspecified: Secondary | ICD-10-CM | POA: Diagnosis not present

## 2014-01-18 DIAGNOSIS — E119 Type 2 diabetes mellitus without complications: Secondary | ICD-10-CM | POA: Diagnosis not present

## 2014-01-18 DIAGNOSIS — Z0000 Encounter for general adult medical examination without abnormal findings: Secondary | ICD-10-CM | POA: Diagnosis not present

## 2014-01-25 ENCOUNTER — Other Ambulatory Visit: Payer: Self-pay | Admitting: Dermatology

## 2014-01-25 ENCOUNTER — Telehealth: Payer: Self-pay | Admitting: Neurology

## 2014-01-25 DIAGNOSIS — D229 Melanocytic nevi, unspecified: Secondary | ICD-10-CM | POA: Diagnosis not present

## 2014-01-25 DIAGNOSIS — L309 Dermatitis, unspecified: Secondary | ICD-10-CM | POA: Diagnosis not present

## 2014-01-25 DIAGNOSIS — C44212 Basal cell carcinoma of skin of right ear and external auricular canal: Secondary | ICD-10-CM | POA: Diagnosis not present

## 2014-01-25 DIAGNOSIS — Z86018 Personal history of other benign neoplasm: Secondary | ICD-10-CM | POA: Diagnosis not present

## 2014-01-25 DIAGNOSIS — L821 Other seborrheic keratosis: Secondary | ICD-10-CM | POA: Diagnosis not present

## 2014-01-25 DIAGNOSIS — C4441 Basal cell carcinoma of skin of scalp and neck: Secondary | ICD-10-CM | POA: Diagnosis not present

## 2014-01-25 DIAGNOSIS — L570 Actinic keratosis: Secondary | ICD-10-CM | POA: Diagnosis not present

## 2014-01-25 DIAGNOSIS — D485 Neoplasm of uncertain behavior of skin: Secondary | ICD-10-CM | POA: Diagnosis not present

## 2014-01-25 DIAGNOSIS — Z85828 Personal history of other malignant neoplasm of skin: Secondary | ICD-10-CM | POA: Diagnosis not present

## 2014-01-25 NOTE — Telephone Encounter (Signed)
Patient requesting sooner appointment to be seen, states that his electric shock feeling on the side of his face has been returning, please return call and advise.

## 2014-01-25 NOTE — Telephone Encounter (Signed)
Called patient back appointment was scheduled for 01/26/14 at 8 am.

## 2014-01-26 ENCOUNTER — Ambulatory Visit (INDEPENDENT_AMBULATORY_CARE_PROVIDER_SITE_OTHER): Payer: Medicare Other | Admitting: Neurology

## 2014-01-26 ENCOUNTER — Encounter: Payer: Self-pay | Admitting: Neurology

## 2014-01-26 VITALS — BP 107/76 | HR 77 | Ht 72.0 in | Wt 209.6 lb

## 2014-01-26 DIAGNOSIS — G44059 Short lasting unilateral neuralgiform headache with conjunctival injection and tearing (SUNCT), not intractable: Secondary | ICD-10-CM | POA: Diagnosis not present

## 2014-01-26 MED ORDER — TOPIRAMATE 100 MG PO TABS: 100.0000 mg | ORAL_TABLET | Freq: Two times a day (BID) | ORAL | Status: DC

## 2014-01-26 MED ORDER — LAMOTRIGINE 100 MG PO TABS: 100.0000 mg | ORAL_TABLET | Freq: Two times a day (BID) | ORAL | Status: DC

## 2014-01-26 NOTE — Progress Notes (Signed)
Reason for visit: SUNCT headaches  Christian Ross is an 73 y.o. male  History of present illness:  Christian Ross is a 73 year old right-handed white male with a history of left-sided SUNCT headache. The patient indicates that he has been doing quite well on the combination of Topamax and Lamictal with no headaches whatsoever until about 2 or 3 months ago. The patient began having a very occasional mild twinge of pain in the left forehead, but over the last 2 weeks and particularly over the last 2 or 3 days, the sharp twinges of pain have become more frequent and more severe. The patient comes in on an urgent basis for an evaluation. The patient indicates that there are no activating factors for the headache. The patient is tolerating the current dosing of the Lamictal and the Topamax well, the dosing on both of these medications is 75 mg twice daily. The patient has had no other new medical issues that have come up since last seen. He indicates that his pills have not changed in color or shape.  Past Medical History  Diagnosis Date  . Hyperlipidemia   . Vitamin D deficiency   . Hypertension     orthostatic  . Hypertriglyceridemia   . Insulin resistance   . Erectile dysfunction   . Hemorrhoids   . Inguinal hernia   . Hx of colonic polyps   . Diverticulitis   . Diverticulosis   . Cancer     skin pre cancer nose  . 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 Knee surgery  1960    right  . 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 Colonoscopy w/ biopsies and polypectomy  multiple, 2005, 08, 14  . Nasal sinus surgery    . Rectal biopsy      Family History  Problem Relation Age of Onset  . Colon cancer Neg Hx     Social history:  reports that he has never smoked. He has never used smokeless tobacco. He reports that he drinks alcohol. He reports that he does not use illicit drugs.    Allergies  Allergen Reactions  . Lisinopril Cough    Causes  a cough    Medications:  Current Outpatient Prescriptions on File Prior to Visit  Medication Sig Dispense Refill  . Ascorbic Acid (VITAMIN C) 1000 MG tablet Take 2,000 mg by mouth daily.      Marland Kitchen aspirin 81 MG tablet Take 81 mg by mouth every evening.       . Berberine Chloride POWD Take 400 mg by mouth daily.      Marland Kitchen DIOVAN 160 MG tablet TAKE 1 TABLET DAILY  90 tablet  0  . fenofibrate (TRICOR) 145 MG tablet Take 1 tablet (145 mg total) by mouth every evening.  90 tablet  3  . Lutein 15-0.7 MG CAPS Take 1 capsule by mouth daily.      . metoprolol tartrate (LOPRESSOR) 25 MG tablet Take 1 tablet (25 mg total) by mouth 2 (two) times daily.  180 tablet  0  . omega-3 acid ethyl esters (LOVAZA) 1 G capsule Take 1 g by mouth 3 (three) times daily.      . rosuvastatin (CRESTOR) 10 MG tablet Take 10 mg by mouth every evening.       . [DISCONTINUED] irbesartan (AVAPRO) 150 MG tablet Take 1 tablet (150 mg total) by mouth daily.  90 tablet  3  No current facility-administered medications on file prior to visit.    ROS:  Out of a complete 14 system review of symptoms, the patient complains only of the following symptoms, and all other reviewed systems are negative.  Snoring Skin rash  Blood pressure 107/76, pulse 77, height 6' (1.829 m), weight 209 lb 9.6 oz (95.074 kg).  Physical Exam  General: The patient is alert and cooperative at the time of the examination.  Skin: No significant peripheral edema is noted.   Neurologic Exam  Mental status: The patient is oriented x 3.  Cranial nerves: Facial symmetry is present. Speech is normal, no aphasia or dysarthria is noted. Extraocular movements are full. Visual fields are full.  Motor: The patient has good strength in all 4 extremities.  Sensory examination: Soft touch sensation is symmetric on the face, arms, and legs.  Coordination: The patient has good finger-nose-finger and heel-to-shin bilaterally.  Gait and station: The patient  has a normal gait. Tandem gait is normal. Romberg is negative. No drift is seen.  Reflexes: Deep tendon reflexes are symmetric.   Assessment/Plan:  1. SUNCT headache  The patient is having increased discomfort with the twinges of pain on the left forehead. The medication will be increased, the Topamax and the Lamictal will be changed to 100 mg twice daily. The patient will followup in 4 months, but he is to contact our office if the headaches persist.  C. Floyde Parkins MD 01/26/2014 8:14 PM  [facility] [address] [place], [place] [address]  Phone [phone] Fax [phone]

## 2014-01-26 NOTE — Patient Instructions (Signed)

## 2014-01-30 ENCOUNTER — Telehealth: Payer: Self-pay | Admitting: Neurology

## 2014-01-30 NOTE — Telephone Encounter (Signed)
Patient is calling-despite an increase in medications Topomax and Lamictal the pain and frequency has increased-does patient need to increase the medication even more. Please call.

## 2014-01-30 NOTE — Telephone Encounter (Signed)
I called patient. The patient has had worsening headaches, Topamax and Lamictal were recently increased on the dose. The patient will go to 150 mg of the Topamax twice daily now. We will need to add gabapentin in the future if the pain continues. The Lamictal cannot be increased rapidly. The patient did not respond to indomethacin or prednisone previously.

## 2014-02-03 NOTE — Telephone Encounter (Signed)
, °

## 2014-02-14 ENCOUNTER — Telehealth: Payer: Self-pay | Admitting: Neurology

## 2014-02-14 MED ORDER — TOPIRAMATE 25 MG PO TABS: ORAL_TABLET | ORAL | Status: DC

## 2014-02-14 NOTE — Telephone Encounter (Signed)
Patient calling to state that his 100 mg Lamotrigine was not working and that he "went downhill fast" and so he went back to his old 25 mg dosage of Lamotrigine and wanted to make Dr. Jannifer Franklin aware. Patient also has some questions about his Topamax. Please return call and advise.

## 2014-02-14 NOTE — Telephone Encounter (Signed)
I called the patient. The patient indicates that the headaches have improved, but only when taking the 25 mg Topamax, 100 mg Topamax did not help. We will switch him to the 25 mg tablets, taking 6 tablets twice daily. He is on lamotrigine taking 100 mg twice daily.

## 2014-02-17 ENCOUNTER — Other Ambulatory Visit: Payer: Self-pay | Admitting: Cardiovascular Disease

## 2014-02-17 ENCOUNTER — Other Ambulatory Visit: Payer: Self-pay

## 2014-02-17 MED ORDER — LAMOTRIGINE 25 MG PO TABS: 100.0000 mg | ORAL_TABLET | Freq: Two times a day (BID) | ORAL | Status: DC

## 2014-02-17 NOTE — Telephone Encounter (Signed)
Patient calling to state that we need to call [facility] and explain why the second order of Lamotrigine was ordered so close together, please call [phone]

## 2014-02-17 NOTE — Telephone Encounter (Signed)
I called [facility].  Spoke with Festus Holts, who was not able to assist me and transferred me to [place].  She said they shipped Lamotrigine 100mg  on 10/10 and have delivery confirmation on 10/14.  Says they do not see that anything was needed from [place].  Stated patient would need to call them if there is an issue with his delivery.  I called the patient back.  He said the medication was delivered, however, he wants to go back to taking 25mg  four twice daily instead of 100mg  one twice daily.  (total dose remains the same).  He feels the 25mg  is more effective for him.  Rx has been resent.

## 2014-02-21 ENCOUNTER — Other Ambulatory Visit: Payer: Self-pay

## 2014-02-21 MED ORDER — METOPROLOL TARTRATE 25 MG PO TABS: 25.0000 mg | ORAL_TABLET | Freq: Two times a day (BID) | ORAL | Status: DC

## 2014-02-27 ENCOUNTER — Other Ambulatory Visit: Payer: Self-pay

## 2014-02-27 MED ORDER — ROSUVASTATIN CALCIUM 10 MG PO TABS: 10.0000 mg | ORAL_TABLET | Freq: Every evening | ORAL | Status: DC

## 2014-02-28 ENCOUNTER — Telehealth: Payer: Self-pay

## 2014-02-28 MED ORDER — ATORVASTATIN CALCIUM 40 MG PO TABS: 40.0000 mg | ORAL_TABLET | Freq: Every day | ORAL | Status: DC

## 2014-02-28 NOTE — Telephone Encounter (Signed)
New Rx sent for Atorvastatin 40 mg and Crestor discontinued

## 2014-02-28 NOTE — Telephone Encounter (Signed)
Dr. Acie Fredrickson, patient is currently on Crestor; I do not find documentation of other agents except for Fenofibrate and Niacin.  Preferred medications per insurance are Pravastatin, Atorvastatin, Simvastatin, and Lovastatin.

## 2014-02-28 NOTE — Telephone Encounter (Signed)
Ok to change to Atorvastatin 40 a day. Instead of Crestor  ( insurance preference)

## 2014-03-06 ENCOUNTER — Telehealth: Payer: Self-pay | Admitting: *Deleted

## 2014-03-06 NOTE — Telephone Encounter (Signed)
Called and spoke with patient and informed him that medication has been denied by [facility].  Patient states he has been on medication for 10 years with no adverse effects and is concerned about the effects of Atorvastatin and Simvastatin.  I advised patient that I will discuss with Dr. Acie Fredrickson and get his help with approval by insurance company.  I advised him that I have placed one month of samples at front desk as he informed us earlier that he is out of the medication.  Patient verbalized understanding and gratitude.

## 2014-03-06 NOTE — Telephone Encounter (Signed)
Called [phone] for Prior Authorization of Crestor - request denied.  [facility] states patient should try Simvastatin before having Crestor approved.  I advised that I will notify patient

## 2014-03-06 NOTE — Telephone Encounter (Signed)
Spoke with patient and he was requesting a refill on crestor. I informed him that his insurance was no longer going to pay for the crestor and that an rx for atorvastatin was recently sent in. He stated that there was a lot of controversy about the lipitor and he would like to remain on crestor. I made him aware that he would need to pay out of pocket for the crestor since his insurance would not pay for it. He said ok and thanked me for the information. I just received a voicemail from him stating that the crestor simply needs a prior authorization to be called into [phone]. Please advise. Thanks, MI

## 2014-03-08 DIAGNOSIS — C4441 Basal cell carcinoma of skin of scalp and neck: Secondary | ICD-10-CM | POA: Diagnosis not present

## 2014-03-08 DIAGNOSIS — D224 Melanocytic nevi of scalp and neck: Secondary | ICD-10-CM | POA: Diagnosis not present

## 2014-03-08 DIAGNOSIS — C44212 Basal cell carcinoma of skin of right ear and external auricular canal: Secondary | ICD-10-CM | POA: Diagnosis not present

## 2014-03-13 ENCOUNTER — Telehealth: Payer: Self-pay | Admitting: Neurology

## 2014-03-13 MED ORDER — LAMOTRIGINE 25 MG PO TABS: 100.0000 mg | ORAL_TABLET | Freq: Two times a day (BID) | ORAL | Status: DC

## 2014-03-13 NOTE — Telephone Encounter (Signed)
Rx sent noting Manufacturer Preferred.  I called the patient back. He is aware, and will call us back if anything further is needed.

## 2014-03-13 NOTE — Telephone Encounter (Signed)
Patient Rx for lamoTRIgine (LAMICTAL) 25 MG tablet from [facility] does not agree with his body.  Previous Rx were with Manufactor, [facility], and medication seemed to work better.  [facility] stated they would order from [facility], but need Rx requesting they do so.  Please call and advise.

## 2014-03-14 NOTE — Telephone Encounter (Signed)
Patient called back and stated Manufacturer should be [employer] and Rx should be dispensed as written.  Please call and advise.

## 2014-03-14 NOTE — Telephone Encounter (Signed)
I called the pharmacy at [phone].  Spoke with Kennyth Lose, who was not able to assist me and transferred me to [place].  He was also not able to assist me and transferred me to [place].  She said the patient called them and cancelled all orders, so she was also unable to help.  She asked that I call [phone].  I called this number, spoke with Janelle.  After a few minutes, the call was disconnected.  I called back again.  Spoke with Anista.  Verified the patient only wants one manufacturer of Lamotrigine.  She notated the account and said they will process order with this info.  I called the patient back.  He is aware.

## 2014-03-17 NOTE — Telephone Encounter (Signed)
I called the pharmacy.  They have approved the Crestor.

## 2014-03-21 NOTE — Telephone Encounter (Signed)
Patient aware that Crestor has been approved.  Patient verbalized appreciation to Dr. Acie Fredrickson and our staff for taking care of this.

## 2014-03-27 ENCOUNTER — Ambulatory Visit: Payer: Self-pay | Admitting: Nurse Practitioner

## 2014-03-31 DIAGNOSIS — H01005 Unspecified blepharitis left lower eyelid: Secondary | ICD-10-CM | POA: Diagnosis not present

## 2014-03-31 DIAGNOSIS — H01002 Unspecified blepharitis right lower eyelid: Secondary | ICD-10-CM | POA: Diagnosis not present

## 2014-03-31 DIAGNOSIS — H01001 Unspecified blepharitis right upper eyelid: Secondary | ICD-10-CM | POA: Diagnosis not present

## 2014-03-31 DIAGNOSIS — H0015 Chalazion left lower eyelid: Secondary | ICD-10-CM | POA: Diagnosis not present

## 2014-03-31 DIAGNOSIS — H01004 Unspecified blepharitis left upper eyelid: Secondary | ICD-10-CM | POA: Diagnosis not present

## 2014-04-07 DIAGNOSIS — H0289 Other specified disorders of eyelid: Secondary | ICD-10-CM | POA: Diagnosis not present

## 2014-04-25 ENCOUNTER — Telehealth: Payer: Self-pay | Admitting: Cardiovascular Disease

## 2014-04-25 NOTE — Telephone Encounter (Signed)
Spoke with patient who states he has not received his Crestor from [employer].  Patient states he called to inquire because on 12/1 he was notified that Dr. Acie Fredrickson had called [facility] and gotten approval for Rx to be filled.  Patient states he was informed by [facility] that there was no record of this encounter.  He was advised that Dr. Acie Fredrickson should call back to [phone] for quickest route to patient getting the medication.  I advised patient that I will place samples at the front desk for him as he is currently out of the medication and will route message to Dr. Acie Fredrickson who is in the hospital today.  Dr. Acie Fredrickson, please get a name and title when you call [facility] so that this can be tracked if Rx is not sent to patient.

## 2014-04-25 NOTE — Telephone Encounter (Signed)
New Msg       Pt would like a call back, states he has discussed concerns with medications previously and has a complicated situation.   No specific details given.

## 2014-04-27 ENCOUNTER — Telehealth: Payer: Self-pay | Admitting: Cardiovascular Disease

## 2014-04-27 NOTE — Telephone Encounter (Signed)
Talked on the phone to an assistant at [place]  Crestor is denied by [employer] Needs to be appealed.  Requires 2 letters #1   Letter from patient to give permission for the doctor to appeal l this medication.    #2 letter from [place] the medical reasons for the specific medication  This process typically takes 90 days after all the letters are received. Must be mailed in , not faxed.  Mail letters to :  [place], [place]  Attn:  Clinical appeals dept.  Please review these issues with the patient. He will need to write a letter giving me permission to appeal this decision on his behalf to [place]  Once we receive his letter, I will write a letter appealing the refusal of them to provide Crestor. He has tried 1 or 2 statins in the past and had severe muscle aches.  Will try to get him the crestor.   Thayer Headings, Brooke Bonito., MD, [facility] 04/27/2014, 5:22 PM [address]  [place] Pager [phone]

## 2014-04-27 NOTE — Telephone Encounter (Signed)
Dr. Acie Fredrickson attempted to call [phone] which is an auto supply company.  I left message for patient to call back with correct phone number

## 2014-04-27 NOTE — Telephone Encounter (Signed)
Received call from patient who states he accidentally gave [place] the wrong phone number.  Correct number is [phone].  I advised patient that I will call back if there are any problems

## 2014-04-28 ENCOUNTER — Telehealth: Payer: Self-pay | Admitting: Nurse Practitioner

## 2014-04-28 NOTE — Telephone Encounter (Signed)
See telephone encounter for 1/7

## 2014-04-28 NOTE — Telephone Encounter (Signed)
I called and advised patient of need for letter addressed to insurance company stating approval for Dr. Acie Fredrickson to appeal Crestor denial with [employer].  Patient verbalized understanding and agreement and states he will drop letter off on Monday.

## 2014-05-09 ENCOUNTER — Encounter: Payer: Self-pay | Admitting: Nurse Practitioner

## 2014-05-10 NOTE — Telephone Encounter (Signed)
Letters placed in mail on 1/19

## 2014-05-17 ENCOUNTER — Other Ambulatory Visit: Payer: Self-pay | Admitting: Cardiovascular Disease

## 2014-05-25 ENCOUNTER — Other Ambulatory Visit: Payer: Self-pay

## 2014-05-25 MED ORDER — ROSUVASTATIN CALCIUM 10 MG PO TABS: 10.0000 mg | ORAL_TABLET | Freq: Every day | ORAL | Status: DC

## 2014-05-25 NOTE — Telephone Encounter (Signed)
Patient's wife daughter came to the office to pick up crestor 10 mg samples I gave them to her up front

## 2014-05-29 ENCOUNTER — Encounter: Payer: Self-pay | Admitting: Neurology

## 2014-05-29 ENCOUNTER — Ambulatory Visit: Payer: Self-pay | Admitting: Neurology

## 2014-05-29 ENCOUNTER — Ambulatory Visit (INDEPENDENT_AMBULATORY_CARE_PROVIDER_SITE_OTHER): Payer: Medicare Other | Admitting: Neurology

## 2014-05-29 VITALS — BP 120/76 | HR 81 | Ht 72.0 in | Wt 210.2 lb

## 2014-05-29 DIAGNOSIS — G44059 Short lasting unilateral neuralgiform headache with conjunctival injection and tearing (SUNCT), not intractable: Secondary | ICD-10-CM | POA: Diagnosis not present

## 2014-05-29 NOTE — Patient Instructions (Signed)

## 2014-05-29 NOTE — Progress Notes (Signed)
Reason for visit: Headache  Christian Ross is an 74 y.o. male  History of present illness:  Christian Ross is a 74 year old right-handed gentleman with SUNCT headache. The patient returns to this office indicating that he is doing very well with his headaches. He had difficulty with breakthrough pain when he was switched to a 100 mg tablet of Topamax from the 25 mg tablets. This switch resulted in a change in generic manufactures of the drug, and the patient subsequently developed breakthrough pain on the same dose of Topamax. He is now back on the 25 mg tablets, and he is doing well. He is also taking lamotrigine for the headache. He denies any new medical issues that have come up since last seen. He does occasionally have some slight discomfort in the left brow if wind blows on his face. He returns for an evaluation.  Past Medical History  Diagnosis Date  . Hyperlipidemia   . Vitamin D deficiency   . Hypertension     orthostatic  . Hypertriglyceridemia   . Insulin resistance   . Erectile dysfunction   . Hemorrhoids   . Inguinal hernia   . Hx of colonic polyps   . Diverticulitis   . Diverticulosis   . Cancer     skin pre cancer nose  . 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 Knee surgery  1960    right  . 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 Colonoscopy w/ biopsies and polypectomy  multiple, 2005, 08, 14  . Nasal sinus surgery    . Rectal biopsy      Family History  Problem Relation Age of Onset  . Colon cancer Neg Hx     Social history:  reports that he has never smoked. He has never used smokeless tobacco. He reports that he drinks alcohol. He reports that he does not use illicit drugs.    Allergies  Allergen Reactions  . Lisinopril Cough    Causes a cough    Medications:  Current Outpatient Prescriptions on File Prior to Visit  Medication Sig Dispense Refill  . Ascorbic Acid (VITAMIN C) 1000 MG tablet Take 2,000 mg by  mouth daily.    Marland Kitchen aspirin 81 MG tablet Take 81 mg by mouth every evening.     . Berberine Chloride POWD Take 400 mg by mouth daily.    Marland Kitchen DIOVAN 160 MG tablet TAKE 1 TABLET DAILY 90 tablet 3  . lamoTRIgine (LAMICTAL) 25 MG tablet Take 4 tablets (100 mg total) by mouth 2 (two) times daily. 720 tablet 3  . Lutein 15-0.7 MG CAPS Take 1 capsule by mouth daily.    . metoprolol tartrate (LOPRESSOR) 25 MG tablet Take 1 tablet (25 mg total) by mouth 2 (two) times daily. 180 tablet 1  . omega-3 acid ethyl esters (LOVAZA) 1 G capsule Take 1 g by mouth 3 (three) times daily.    . rosuvastatin (CRESTOR) 10 MG tablet Take 1 tablet (10 mg total) by mouth daily. 90 tablet 3  . topiramate (TOPAMAX) 25 MG tablet 6 tablets twice a day 1080 tablet 3  . TRICOR 145 MG tablet TAKE 1 TABLET EVERY EVENING 90 tablet 2  . [DISCONTINUED] irbesartan (AVAPRO) 150 MG tablet Take 1 tablet (150 mg total) by mouth daily. 90 tablet 3   No current facility-administered medications on file prior to visit.    ROS:  Out of a complete 14 system  review of symptoms, the patient complains only of the following symptoms, and all other reviewed systems are negative.  Snoring Itching  Blood pressure 120/76, pulse 81, height 6' (1.829 m), weight 210 lb 3.2 oz (95.346 kg).  Physical Exam  General: The patient is alert and cooperative at the time of the examination.  Skin: No significant peripheral edema is noted.   Neurologic Exam  Mental status: The patient is oriented x 3.  Cranial nerves: Facial symmetry is present. Speech is normal, no aphasia or dysarthria is noted. Extraocular movements are full. Visual fields are full.  Motor: The patient has good strength in all 4 extremities.  Sensory examination: Soft touch sensation is symmetric on the face, arms, and legs.  Coordination: The patient has good finger-nose-finger and heel-to-shin bilaterally.  Gait and station: The patient has a normal gait. Tandem gait is  slightly unsteady. Romberg is negative, but is slightly unsteady. No drift is seen.  Reflexes: Deep tendon reflexes are symmetric.   Assessment/Plan:  1. SUNCT headache  The patient is doing quite well with his headaches currently. We will continue the Lamictal and Topamax at the current dose, he will follow-up in about 6 months. The patient indicates that a change in generic manufacturer of the drug results in significant changes in his clinical control of his headache. He indicates that his primary care physician checks a liver panel and CBC on an annual basis.  Jill Alexanders MD 05/29/2014 8:52 AM  [facility] [address] [place], [place] [address]  Phone [phone] Fax [phone]

## 2014-06-15 DIAGNOSIS — M25512 Pain in left shoulder: Secondary | ICD-10-CM | POA: Diagnosis not present

## 2014-07-03 DIAGNOSIS — M25512 Pain in left shoulder: Secondary | ICD-10-CM | POA: Diagnosis not present

## 2014-07-17 DIAGNOSIS — N189 Chronic kidney disease, unspecified: Secondary | ICD-10-CM | POA: Diagnosis not present

## 2014-07-17 DIAGNOSIS — E119 Type 2 diabetes mellitus without complications: Secondary | ICD-10-CM | POA: Diagnosis not present

## 2014-07-17 DIAGNOSIS — E785 Hyperlipidemia, unspecified: Secondary | ICD-10-CM | POA: Diagnosis not present

## 2014-07-20 DIAGNOSIS — I10 Essential (primary) hypertension: Secondary | ICD-10-CM | POA: Diagnosis not present

## 2014-07-20 DIAGNOSIS — E785 Hyperlipidemia, unspecified: Secondary | ICD-10-CM | POA: Diagnosis not present

## 2014-07-20 DIAGNOSIS — Z23 Encounter for immunization: Secondary | ICD-10-CM | POA: Diagnosis not present

## 2014-07-20 DIAGNOSIS — M25519 Pain in unspecified shoulder: Secondary | ICD-10-CM | POA: Diagnosis not present

## 2014-07-20 DIAGNOSIS — N183 Chronic kidney disease, stage 3 (moderate): Secondary | ICD-10-CM | POA: Diagnosis not present

## 2014-07-20 DIAGNOSIS — E119 Type 2 diabetes mellitus without complications: Secondary | ICD-10-CM | POA: Diagnosis not present

## 2014-07-20 DIAGNOSIS — R0683 Snoring: Secondary | ICD-10-CM | POA: Diagnosis not present

## 2014-07-24 ENCOUNTER — Encounter: Payer: Self-pay | Admitting: Cardiovascular Disease

## 2014-07-24 ENCOUNTER — Telehealth: Payer: Self-pay | Admitting: *Deleted

## 2014-07-24 NOTE — Telephone Encounter (Signed)
Error

## 2014-07-24 NOTE — Telephone Encounter (Signed)
-----   Message from Thayer Headings, MD sent at 07/24/2014  4:51 PM EDT ----- Labs look ok. Trigs are a bit high. choleterol levels are ok

## 2014-07-25 ENCOUNTER — Other Ambulatory Visit: Payer: Self-pay | Admitting: *Deleted

## 2014-07-25 MED ORDER — ROSUVASTATIN CALCIUM 10 MG PO TABS: 10.0000 mg | ORAL_TABLET | Freq: Every day | ORAL | Status: DC

## 2014-07-25 NOTE — Telephone Encounter (Signed)
Spoke with pt and he asked that refill of Crestor be sent to [name]. Prescription sent.

## 2014-07-27 ENCOUNTER — Other Ambulatory Visit: Payer: Self-pay | Admitting: Dermatology

## 2014-07-27 ENCOUNTER — Other Ambulatory Visit: Payer: Self-pay | Admitting: Cardiovascular Disease

## 2014-07-27 DIAGNOSIS — C44619 Basal cell carcinoma of skin of left upper limb, including shoulder: Secondary | ICD-10-CM | POA: Diagnosis not present

## 2014-07-27 DIAGNOSIS — L309 Dermatitis, unspecified: Secondary | ICD-10-CM | POA: Diagnosis not present

## 2014-07-27 DIAGNOSIS — L570 Actinic keratosis: Secondary | ICD-10-CM | POA: Diagnosis not present

## 2014-07-27 DIAGNOSIS — D485 Neoplasm of uncertain behavior of skin: Secondary | ICD-10-CM | POA: Diagnosis not present

## 2014-07-27 DIAGNOSIS — Z85828 Personal history of other malignant neoplasm of skin: Secondary | ICD-10-CM | POA: Diagnosis not present

## 2014-07-27 DIAGNOSIS — G4733 Obstructive sleep apnea (adult) (pediatric): Secondary | ICD-10-CM | POA: Diagnosis not present

## 2014-07-27 NOTE — Telephone Encounter (Signed)
Thayer Headings, MD at 01/12/2014 3:35 PM  metoprolol tartrate (LOPRESSOR) 25 MG tabletTake 1 tablet (25 mg total) by mouth 2 (two) times daily  Your physician recommends that you continue on your current medications as directed. Please refer to the Current Medication list given to you today.

## 2014-08-18 ENCOUNTER — Ambulatory Visit (HOSPITAL_COMMUNITY)
Admission: RE | Admit: 2014-08-18 | Discharge: 2014-08-18 | Disposition: A | Discharge status: Home / Self Care | Payer: Medicare Other | Source: Ambulatory Visit | Attending: Specialist | Admitting: Specialist

## 2014-08-18 ENCOUNTER — Other Ambulatory Visit (HOSPITAL_COMMUNITY): Payer: Self-pay | Admitting: Specialist

## 2014-08-18 DIAGNOSIS — Z01810 Encounter for preprocedural cardiovascular examination: Secondary | ICD-10-CM

## 2014-08-18 DIAGNOSIS — Z01818 Encounter for other preprocedural examination: Secondary | ICD-10-CM | POA: Diagnosis not present

## 2014-08-18 DIAGNOSIS — M25512 Pain in left shoulder: Secondary | ICD-10-CM | POA: Diagnosis not present

## 2014-08-18 DIAGNOSIS — M7542 Impingement syndrome of left shoulder: Secondary | ICD-10-CM | POA: Diagnosis not present

## 2014-08-20 DIAGNOSIS — R059 Cough, unspecified: Secondary | ICD-10-CM | POA: Insufficient documentation

## 2014-08-20 DIAGNOSIS — R05 Cough: Secondary | ICD-10-CM | POA: Insufficient documentation

## 2014-08-20 HISTORY — DX: Cough, unspecified: R05.9

## 2014-08-24 DIAGNOSIS — I10 Essential (primary) hypertension: Secondary | ICD-10-CM | POA: Diagnosis not present

## 2014-08-24 DIAGNOSIS — N183 Chronic kidney disease, stage 3 (moderate): Secondary | ICD-10-CM | POA: Diagnosis not present

## 2014-08-24 DIAGNOSIS — M25519 Pain in unspecified shoulder: Secondary | ICD-10-CM | POA: Diagnosis not present

## 2014-08-24 DIAGNOSIS — Z01818 Encounter for other preprocedural examination: Secondary | ICD-10-CM | POA: Diagnosis not present

## 2014-08-25 DIAGNOSIS — G8929 Other chronic pain: Secondary | ICD-10-CM | POA: Diagnosis not present

## 2014-08-25 DIAGNOSIS — M25512 Pain in left shoulder: Secondary | ICD-10-CM | POA: Diagnosis not present

## 2014-08-30 ENCOUNTER — Ambulatory Visit: Payer: Self-pay | Admitting: Orthopedic Surgery

## 2014-09-04 ENCOUNTER — Ambulatory Visit: Payer: Self-pay | Admitting: Orthopedic Surgery

## 2014-09-04 NOTE — H&P (Signed)
Christian Ross is an 74 y.o. male.   Chief Complaint: L shoulder pain HPI: The patient is a 74 year old male who presents today for follow up of their shoulder. The patient is being followed for their left shoulder pain and impingement. They are 1 year, 1 1/2 months out from when symptoms began. Symptoms reported today include: pain, pain at night, aching, stiffness and pain with overhead motions. The patient feels that they are doing poorly. Current treatment includes: home exercise program and activity modification. The following medication has been used for pain control: none. The patient presents today following cortisone injection x 6 1/2 weeks. The patient has not gotten any relief of their symptoms with Cortisone injections.  Past Medical History  Diagnosis Date  . Hyperlipidemia   . Vitamin D deficiency   . Hypertension     orthostatic  . Hypertriglyceridemia   . Insulin resistance   . Erectile dysfunction   . Hemorrhoids   . Inguinal hernia   . Hx of colonic polyps   . Diverticulitis   . Diverticulosis   . Cancer     skin pre cancer nose  . 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 Knee surgery  1960    right  . 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 Colonoscopy w/ biopsies and polypectomy  multiple, 2005, 08, 14  . Nasal sinus surgery    . Rectal biopsy      Family History  Problem Relation Age of Onset  . Colon cancer Neg Hx    Social History:  reports that he has never smoked. He has never used smokeless tobacco. He reports that he drinks alcohol. He reports that he does not use illicit drugs.  Allergies:  Allergies  Allergen Reactions  . Lisinopril Cough    Causes a cough     (Not in a hospital admission)  No results found for this or any previous visit (from the past 48 hour(s)). No results found.  Review of Systems  Constitutional: Negative.   HENT: Negative.   Eyes: Negative.   Respiratory: Negative.    Cardiovascular: Negative.   Gastrointestinal: Negative.   Genitourinary: Negative.   Musculoskeletal: Positive for joint pain.  Skin: Negative.   Neurological: Negative.   Psychiatric/Behavioral: Negative.     There were no vitals taken for this visit. Physical Exam  Constitutional: He is oriented to person, place, and time. He appears well-developed and well-nourished.  HENT:  Head: Normocephalic and atraumatic.  Eyes: Conjunctivae and EOM are normal. Pupils are equal, round, and reactive to light.  Neck: Normal range of motion. Neck supple.  Cardiovascular: Normal rate and regular rhythm.   Respiratory: Effort normal and breath sounds normal.  GI: Soft. Bowel sounds are normal.  Musculoskeletal:  On exam, he has a positive impingement sign, positive secondary impingement sign of the shoulder. Weak in external rotation. Tender in the anterior subacromial region. Decreased internal rotation.  Inspection of the shoulder revealed no ecchymosis, soft tissue swelling or deformity. On palpation, nontender over the [facility]. On range of motion, the patient had full range of motion. Provocative signs indicated no sulcus sign, and negative speed's test. Negative lift off. Sensory exam was intact and motor function was normal in the deltoid and the rotator cuff.  Neurological: He is alert and oriented to person, place, and time. He has normal reflexes.  Skin: Skin is warm and dry.  Psychiatric: He has a  normal mood and affect.    Three view radiographs demonstrate a Type 2 acromion, decreased acromiohumeral distance.  MRI with full thickness tear supraspinatus with 2cm retraction. Delamination type tearing infraspinatus. 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  Assessment/Plan L shoulder RCT  Persistent rotator cuff arthropathy, tear, one and a half years status post injury with one and a half years of symptoms. No help from a corticosteroid injection, with decreased acromiohumeral  distance.  We discussed rotator cuff repair.  I had a long discussion with the patient concerning the risks and benefits of a left rotator cuff repair, including bleeding, infection, and prolonged postoperative recovery, which may require 3 to 5 months until maximum medical improvement. Overnight procedure with initiation of early passive range of motion within physical therapy. Avoid any active motion for the first six weeks. This is all in an effort to avoid recurrent tear of the rotator cuff and adhesive capsulitis. Return to work without use of the arm can be obtained following two weeks. However, driving will be a challenge. We also discussed the possibility of requiring implants including bone anchors, as well as an Allograft patch graft if a massive rotator cuff tear is encountered. Removal of any bones for spurs as well as bursitis will be performed during the procedure and also any associated anesthetic complications as well.  We will call him with that result. In the interim, he is to perform activity modification and use Tylenol at night.  Plan left shoulder mini-open rotator cuff repair, subacromial decompression  Jantzen Pilger M. PA-C for Dr. Tonita Cong 09/04/2014, 8:19 AM

## 2014-09-05 DIAGNOSIS — S46012D Strain of muscle(s) and tendon(s) of the rotator cuff of left shoulder, subsequent encounter: Secondary | ICD-10-CM | POA: Diagnosis not present

## 2014-09-05 DIAGNOSIS — M7542 Impingement syndrome of left shoulder: Secondary | ICD-10-CM | POA: Diagnosis not present

## 2014-09-05 DIAGNOSIS — M25512 Pain in left shoulder: Secondary | ICD-10-CM | POA: Diagnosis not present

## 2014-09-07 DIAGNOSIS — C44619 Basal cell carcinoma of skin of left upper limb, including shoulder: Secondary | ICD-10-CM | POA: Diagnosis not present

## 2014-09-07 NOTE — Patient Instructions (Signed)
YOUR PROCEDURE IS SCHEDULED ON :  09/14/14  REPORT TO [place] MAIN ENTRANCE FOLLOW SIGNS TO SHORT STAY CENTER AT :  7:00 am  CALL THIS NUMBER IF YOU HAVE PROBLEMS THE MORNING OF SURGERY [phone]  REMEMBER:  DO NOT EAT FOOD OR DRINK LIQUIDS AFTER MIDNIGHT  TAKE THESE MEDICINES THE MORNING OF SURGERY:  YOU MAY NOT HAVE ANY METAL ON YOUR BODY INCLUDING HAIR PINS AND PIERCING'S. DO NOT WEAR JEWELRY, MAKEUP, LOTIONS, POWDERS OR PERFUMES. DO NOT WEAR NAIL POLISH. DO NOT SHAVE 48 HRS PRIOR TO SURGERY. MEN MAY SHAVE FACE AND NECK.  DO NOT [facility]. [facility] IS NOT RESPONSIBLE FOR VALUABLES.  CONTACTS, DENTURES OR PARTIALS MAY NOT BE WORN TO SURGERY. LEAVE SUITCASE IN CAR. CAN BE BROUGHT TO ROOM AFTER SURGERY.  PATIENTS DISCHARGED THE DAY OF SURGERY WILL NOT BE ALLOWED TO DRIVE HOME.  PLEASE READ OVER THE FOLLOWING INSTRUCTION SHEETS _________________________________________________________________________________                                          [place] - PREPARING FOR SURGERY  Before surgery, you can play an important role.  Because skin is not sterile, your skin needs to be as free of germs as possible.  You can reduce the number of germs on your skin by washing with CHG (chlorahexidine gluconate) soap before surgery.  CHG is an antiseptic cleaner which kills germs and bonds with the skin to continue killing germs even after washing. Please DO NOT use if you have an allergy to CHG or antibacterial soaps.  If your skin becomes reddened/irritated stop using the CHG and inform your nurse when you arrive at Short Stay. Do not shave (including legs and underarms) for at least 48 hours prior to the first CHG shower.  You may shave your face. Please follow these instructions carefully:   1.  Shower with CHG Soap the night before surgery and the  morning of Surgery.   2.  If you choose to wash your hair, wash your hair first as usual  with your  normal  Shampoo.   3.  After you shampoo, rinse your hair and body thoroughly to remove the  shampoo.                                         4.  Use CHG as you would any other liquid soap.  You can apply chg directly  to the skin and wash . Gently wash with scrungie or clean wascloth    5.  Apply the CHG Soap to your body ONLY FROM THE NECK DOWN.   Do not use on open                           Wound or open sores. Avoid contact with eyes, ears mouth and genitals (private parts).                        Genitals (private parts) with your normal soap.              6.  Wash thoroughly, paying special attention to the area where your surgery  will be performed.   7.  Thoroughly rinse your body with warm water from the neck down.   8.  DO NOT shower/wash with your normal soap after using and rinsing off  the CHG Soap .                9.  Pat yourself dry with a clean towel.             10.  Wear clean night clothes to bed after shower             11.  Place clean sheets on your bed the night of your first shower and do not  sleep with pets.  Day of Surgery : Do not apply any lotions/deodorants the morning of surgery.  Please wear clean clothes to the hospital/surgery center.  FAILURE TO FOLLOW THESE INSTRUCTIONS MAY RESULT IN THE CANCELLATION OF YOUR SURGERY    PATIENT SIGNATURE_________________________________  ______________________________________________________________________     Christian Ross  An incentive spirometer is a tool that can help keep your lungs clear and active. This tool measures how well you are filling your lungs with each breath. Taking long deep breaths may help reverse or decrease the chance of developing breathing (pulmonary) problems (especially infection) following:  A long period of time when you are unable to move or be active. BEFORE THE PROCEDURE   If the spirometer includes an indicator to show your best effort, your nurse or  respiratory therapist will set it to a desired goal.  If possible, sit up straight or lean slightly forward. Try not to slouch.  Hold the incentive spirometer in an upright position. INSTRUCTIONS FOR USE  1. Sit on the edge of your bed if possible, or sit up as far as you can in bed or on a chair. 2. Hold the incentive spirometer in an upright position. 3. Breathe out normally. 4. Place the mouthpiece in your mouth and seal your lips tightly around it. 5. Breathe in slowly and as deeply as possible, raising the piston or the ball toward the top of the column. 6. Hold your breath for 3-5 seconds or for as long as possible. Allow the piston or ball to fall to the bottom of the column. 7. Remove the mouthpiece from your mouth and breathe out normally. 8. Rest for a few seconds and repeat Steps 1 through 7 at least 10 times every 1-2 hours when you are awake. Take your time and take a few normal breaths between deep breaths. 9. The spirometer may include an indicator to show your best effort. Use the indicator as a goal to work toward during each repetition. 10. After each set of 10 deep breaths, practice coughing to be sure your lungs are clear. If you have an incision (the cut made at the time of surgery), support your incision when coughing by placing a pillow or rolled up towels firmly against it. Once you are able to get out of bed, walk around indoors and cough well. You may stop using the incentive spirometer when instructed by your caregiver.  RISKS AND COMPLICATIONS  Take your time so you do not get dizzy or light-headed.  If you are in pain, you may need to take or ask for pain medication before doing incentive spirometry. It is harder to take a deep breath if you are having pain. AFTER USE  Rest and breathe slowly and easily.  It can be helpful to keep track of a log of your progress. Your caregiver can provide you with a  simple table to help with this. If you are using the  spirometer at home, follow these instructions: [place] IF:   You are having difficultly using the spirometer.  You have trouble using the spirometer as often as instructed.  Your pain medication is not giving enough relief while using the spirometer.  You develop fever of 100.5 F (38.1 C) or higher. SEEK IMMEDIATE MEDICAL CARE IF:   You cough up bloody sputum that had not been present before.  You develop fever of 102 F (38.9 C) or greater.  You develop worsening pain at or near the incision site. MAKE SURE YOU:   Understand these instructions.  Will watch your condition.  Will get help right away if you are not doing well or get worse. Document Released: 08/18/2006 Document Revised: 06/30/2011 Document Reviewed: 10/19/2006 [facility] Patient Information 2014 [place], [place].   ________________________________________________________________________

## 2014-09-08 ENCOUNTER — Inpatient Hospital Stay (HOSPITAL_COMMUNITY)
Admission: RE | Admit: 2014-09-08 | Discharge: 2014-09-08 | Disposition: A | Discharge status: Home / Self Care | Source: Ambulatory Visit

## 2014-09-08 ENCOUNTER — Encounter (HOSPITAL_COMMUNITY): Payer: Self-pay

## 2014-09-08 ENCOUNTER — Encounter (HOSPITAL_COMMUNITY)
Admission: RE | Admit: 2014-09-08 | Discharge: 2014-09-08 | Disposition: A | Discharge status: Home / Self Care | Payer: Medicare Other | Source: Ambulatory Visit | Attending: Specialist | Admitting: Specialist

## 2014-09-08 DIAGNOSIS — Z01812 Encounter for preprocedural laboratory examination: Secondary | ICD-10-CM | POA: Insufficient documentation

## 2014-09-08 HISTORY — DX: Sleep apnea, unspecified: G47.30

## 2014-09-08 HISTORY — DX: Prediabetes: R73.03

## 2014-09-08 HISTORY — DX: Personal history of other malignant neoplasm of skin: Z85.828

## 2014-09-08 HISTORY — DX: Unspecified rotator cuff tear or rupture of unspecified shoulder, not specified as traumatic: M75.100

## 2014-09-08 HISTORY — DX: Unspecified osteoarthritis, unspecified site: M19.90

## 2014-09-08 LAB — CBC
HCT: 41.3 % (ref 39.0–52.0)
Hemoglobin: 13.5 g/dL (ref 13.0–17.0)
MCH: 32.4 pg (ref 26.0–34.0)
MCHC: 32.7 g/dL (ref 30.0–36.0)
MCV: 99 fL (ref 78.0–100.0)
PLATELETS: 248 10*3/uL (ref 150–400)
RBC: 4.17 MIL/uL — ABNORMAL LOW (ref 4.22–5.81)
RDW: 13.4 % (ref 11.5–15.5)
WBC: 5.5 10*3/uL (ref 4.0–10.5)

## 2014-09-08 LAB — BASIC METABOLIC PANEL
Anion gap: 10 (ref 5–15)
BUN: 30 mg/dL — ABNORMAL HIGH (ref 6–20)
CHLORIDE: 106 mmol/L (ref 101–111)
CO2: 23 mmol/L (ref 22–32)
Calcium: 9.9 mg/dL (ref 8.9–10.3)
Creatinine, Ser: 1.49 mg/dL — ABNORMAL HIGH (ref 0.61–1.24)
GFR calc Af Amer: 52 mL/min — ABNORMAL LOW (ref >60)
GFR, EST NON AFRICAN AMERICAN: 45 mL/min — AB (ref >60)
Glucose, Bld: 121 mg/dL — ABNORMAL HIGH (ref 65–99)
POTASSIUM: 4.5 mmol/L (ref 3.5–5.1)
SODIUM: 139 mmol/L (ref 135–145)

## 2014-09-08 NOTE — Progress Notes (Signed)
Abnormal BMET faxed to Dr. Tonita Cong

## 2014-09-08 NOTE — Patient Instructions (Addendum)
YOUR PROCEDURE IS SCHEDULED ON :  09/14/14  REPORT TO [place] MAIN ENTRANCE FOLLOW SIGNS TO SHORT STAY CENTER AT : 7:00 am  CALL THIS NUMBER IF YOU HAVE PROBLEMS THE MORNING OF SURGERY [phone]  REMEMBER:  DO NOT EAT FOOD OR DRINK LIQUIDS AFTER MIDNIGHT  TAKE THESE MEDICINES THE MORNING OF SURGERY:  TOPIRAMATE / LOMOTRIGINE / METOPROLOL  YOU MAY NOT HAVE ANY METAL ON YOUR BODY INCLUDING HAIR PINS AND PIERCING'S. DO NOT WEAR JEWELRY, MAKEUP, LOTIONS, POWDERS OR PERFUMES. DO NOT WEAR NAIL POLISH. DO NOT SHAVE 48 HRS PRIOR TO SURGERY. MEN MAY SHAVE FACE AND NECK.  DO NOT [place]. [facility] IS NOT RESPONSIBLE FOR VALUABLES.  CONTACTS, DENTURES OR PARTIALS MAY NOT BE WORN TO SURGERY. LEAVE SUITCASE IN CAR. CAN BE BROUGHT TO ROOM AFTER SURGERY.  PATIENTS DISCHARGED THE DAY OF SURGERY WILL NOT BE ALLOWED TO DRIVE HOME.  PLEASE READ OVER THE FOLLOWING INSTRUCTION SHEETS _________________________________________________________________________________                                          [facility] - PREPARING FOR SURGERY  Before surgery, you can play an important role.  Because skin is not sterile, your skin needs to be as free of germs as possible.  You can reduce the number of germs on your skin by washing with CHG (chlorahexidine gluconate) soap before surgery.  CHG is an antiseptic cleaner which kills germs and bonds with the skin to continue killing germs even after washing. Please DO NOT use if you have an allergy to CHG or antibacterial soaps.  If your skin becomes reddened/irritated stop using the CHG and inform your nurse when you arrive at Short Stay. Do not shave (including legs and underarms) for at least 48 hours prior to the first CHG shower.  You may shave your face. Please follow these instructions carefully:   1.  Shower with CHG Soap the night before surgery and the  morning of Surgery.   2.  If you choose to wash your  hair, wash your hair first as usual with your  normal  Shampoo.   3.  After you shampoo, rinse your hair and body thoroughly to remove the  shampoo.                                         4.  Use CHG as you would any other liquid soap.  You can apply chg directly  to the skin and wash . Gently wash with scrungie or clean wascloth    5.  Apply the CHG Soap to your body ONLY FROM THE NECK DOWN.   Do not use on open                           Wound or open sores. Avoid contact with eyes, ears mouth and genitals (private parts).                        Genitals (private parts) with your normal soap.              6.  Wash thoroughly, paying special attention to the area where your surgery  will  be performed.   7.  Thoroughly rinse your body with warm water from the neck down.   8.  DO NOT shower/wash with your normal soap after using and rinsing off  the CHG Soap .                9.  Pat yourself dry with a clean towel.             10.  Wear clean night clothes to bed after shower             11.  Place clean sheets on your bed the night of your first shower and do not  sleep with pets.  Day of Surgery : Do not apply any lotions/deodorants the morning of surgery.  Please wear clean clothes to the hospital/surgery center.  FAILURE TO FOLLOW THESE INSTRUCTIONS MAY RESULT IN THE CANCELLATION OF YOUR SURGERY    PATIENT SIGNATURE_________________________________  ______________________________________________________________________     Christian Ross Ross  An incentive spirometer is a tool that can help keep your lungs clear and active. This tool measures how well you are filling your lungs with each breath. Taking long deep breaths may help reverse or decrease the chance of developing breathing (pulmonary) problems (especially infection) following:  A long period of time when you are unable to move or be active. BEFORE THE PROCEDURE   If the spirometer includes an indicator to  show your best effort, your nurse or respiratory therapist will set it to a desired goal.  If possible, sit up straight or lean slightly forward. Try not to slouch.  Hold the incentive spirometer in an upright position. INSTRUCTIONS FOR USE  1. Sit on the edge of your bed if possible, or sit up as far as you can in bed or on a chair. 2. Hold the incentive spirometer in an upright position. 3. Breathe out normally. 4. Place the mouthpiece in your mouth and seal your lips tightly around it. 5. Breathe in slowly and as deeply as possible, raising the piston or the ball toward the top of the column. 6. Hold your breath for 3-5 seconds or for as long as possible. Allow the piston or ball to fall to the bottom of the column. 7. Remove the mouthpiece from your mouth and breathe out normally. 8. Rest for a few seconds and repeat Steps 1 through 7 at least 10 times every 1-2 hours when you are awake. Take your time and take a few normal breaths between deep breaths. 9. The spirometer may include an indicator to show your best effort. Use the indicator as a goal to work toward during each repetition. 10. After each set of 10 deep breaths, practice coughing to be sure your lungs are clear. If you have an incision (the cut made at the time of surgery), support your incision when coughing by placing a pillow or rolled up towels firmly against it. Once you are able to get out of bed, walk around indoors and cough well. You may stop using the incentive spirometer when instructed by your caregiver.  RISKS AND COMPLICATIONS  Take your time so you do not get dizzy or light-headed.  If you are in pain, you may need to take or ask for pain medication before doing incentive spirometry. It is harder to take a deep breath if you are having pain. AFTER USE  Rest and breathe slowly and easily.  It can be helpful to keep track of a log of your progress. Your  caregiver can provide you with a simple table to help with  this. If you are using the spirometer at home, follow these instructions: [name] IF:   You are having difficultly using the spirometer.  You have trouble using the spirometer as often as instructed.  Your pain medication is not giving enough relief while using the spirometer.  You develop fever of 100.5 F (38.1 C) or higher. SEEK IMMEDIATE MEDICAL CARE IF:   You cough up bloody sputum that had not been present before.  You develop fever of 102 F (38.9 C) or greater.  You develop worsening pain at or near the incision site. MAKE SURE YOU:   Understand these instructions.  Will watch your condition.  Will get help right away if you are not doing well or get worse. Document Released: 08/18/2006 Document Revised: 06/30/2011 Document Reviewed: 10/19/2006 [facility] Patient Information 2014 [place], [place].   ________________________________________________________________________

## 2014-09-14 ENCOUNTER — Ambulatory Visit (HOSPITAL_COMMUNITY): Payer: Medicare Other | Admitting: Anesthesiology

## 2014-09-14 ENCOUNTER — Ambulatory Visit (HOSPITAL_COMMUNITY)
Admission: RE | Admit: 2014-09-14 | Discharge: 2014-09-15 | Disposition: A | Discharge status: Home / Self Care | Payer: Medicare Other | Source: Ambulatory Visit | Attending: Specialist | Admitting: Specialist

## 2014-09-14 ENCOUNTER — Ambulatory Visit (HOSPITAL_COMMUNITY): Payer: Medicare Other

## 2014-09-14 ENCOUNTER — Encounter (HOSPITAL_COMMUNITY)
Admission: RE | Disposition: A | Discharge status: Home / Self Care | Payer: Self-pay | Source: Ambulatory Visit | Attending: Specialist

## 2014-09-14 ENCOUNTER — Encounter (HOSPITAL_COMMUNITY): Payer: Self-pay | Admitting: *Deleted

## 2014-09-14 DIAGNOSIS — M7502 Adhesive capsulitis of left shoulder: Secondary | ICD-10-CM | POA: Insufficient documentation

## 2014-09-14 DIAGNOSIS — Z888 Allergy status to other drugs, medicaments and biological substances status: Secondary | ICD-10-CM | POA: Diagnosis not present

## 2014-09-14 DIAGNOSIS — E781 Pure hyperglyceridemia: Secondary | ICD-10-CM | POA: Insufficient documentation

## 2014-09-14 DIAGNOSIS — Z9889 Other specified postprocedural states: Secondary | ICD-10-CM | POA: Diagnosis not present

## 2014-09-14 DIAGNOSIS — E8881 Metabolic syndrome: Secondary | ICD-10-CM | POA: Insufficient documentation

## 2014-09-14 DIAGNOSIS — I10 Essential (primary) hypertension: Secondary | ICD-10-CM | POA: Insufficient documentation

## 2014-09-14 DIAGNOSIS — G4730 Sleep apnea, unspecified: Secondary | ICD-10-CM | POA: Insufficient documentation

## 2014-09-14 DIAGNOSIS — Z86010 Personal history of colonic polyps: Secondary | ICD-10-CM | POA: Insufficient documentation

## 2014-09-14 DIAGNOSIS — M7542 Impingement syndrome of left shoulder: Secondary | ICD-10-CM | POA: Insufficient documentation

## 2014-09-14 DIAGNOSIS — S46012D Strain of muscle(s) and tendon(s) of the rotator cuff of left shoulder, subsequent encounter: Secondary | ICD-10-CM | POA: Diagnosis not present

## 2014-09-14 DIAGNOSIS — N529 Male erectile dysfunction, unspecified: Secondary | ICD-10-CM | POA: Insufficient documentation

## 2014-09-14 DIAGNOSIS — E559 Vitamin D deficiency, unspecified: Secondary | ICD-10-CM | POA: Diagnosis not present

## 2014-09-14 DIAGNOSIS — M75102 Unspecified rotator cuff tear or rupture of left shoulder, not specified as traumatic: Secondary | ICD-10-CM

## 2014-09-14 DIAGNOSIS — M25512 Pain in left shoulder: Secondary | ICD-10-CM | POA: Diagnosis not present

## 2014-09-14 DIAGNOSIS — M1990 Unspecified osteoarthritis, unspecified site: Secondary | ICD-10-CM | POA: Insufficient documentation

## 2014-09-14 DIAGNOSIS — G8918 Other acute postprocedural pain: Secondary | ICD-10-CM | POA: Diagnosis not present

## 2014-09-14 DIAGNOSIS — M75122 Complete rotator cuff tear or rupture of left shoulder, not specified as traumatic: Secondary | ICD-10-CM | POA: Insufficient documentation

## 2014-09-14 DIAGNOSIS — M75100 Unspecified rotator cuff tear or rupture of unspecified shoulder, not specified as traumatic: Secondary | ICD-10-CM | POA: Diagnosis present

## 2014-09-14 HISTORY — DX: Cough: R05

## 2014-09-14 HISTORY — PX: SHOULDER OPEN ROTATOR CUFF REPAIR: SHX2407

## 2014-09-14 LAB — GLUCOSE, CAPILLARY
Glucose-Capillary: 134 mg/dL — ABNORMAL HIGH (ref 65–99)
Glucose-Capillary: 118 mg/dL — ABNORMAL HIGH (ref 65–99)

## 2014-09-14 SURGERY — REPAIR, ROTATOR CUFF, OPEN
Anesthesia: Regional | Site: Shoulder | Laterality: Left

## 2014-09-14 MED ORDER — SUCCINYLCHOLINE CHLORIDE 20 MG/ML IJ SOLN
INTRAMUSCULAR | Status: DC | PRN
  Administered 2014-09-14: 100 mg via INTRAVENOUS

## 2014-09-14 MED ORDER — MIDAZOLAM HCL 2 MG/2ML IJ SOLN
INTRAMUSCULAR | Status: AC
  Filled 2014-09-14: qty 2

## 2014-09-14 MED ORDER — LIDOCAINE HCL (CARDIAC) 20 MG/ML IV SOLN
INTRAVENOUS | Status: AC
  Filled 2014-09-14: qty 5

## 2014-09-14 MED ORDER — FLUTICASONE PROPIONATE 50 MCG/ACT NA SUSP
1.0000 | Freq: Every day | NASAL | Status: DC
  Administered 2014-09-15: 1 via NASAL
  Filled 2014-09-14: qty 16

## 2014-09-14 MED ORDER — NEOSTIGMINE METHYLSULFATE 10 MG/10ML IV SOLN
INTRAVENOUS | Status: DC | PRN
  Administered 2014-09-14: 3 mg via INTRAVENOUS

## 2014-09-14 MED ORDER — ACETAMINOPHEN 650 MG RE SUPP: 650.0000 mg | Freq: Four times a day (QID) | RECTAL | Status: DC | PRN

## 2014-09-14 MED ORDER — DEXTROSE 5 % IV SOLN
10.0000 mg | INTRAVENOUS | Status: DC | PRN
  Administered 2014-09-14: 10 ug/min via INTRAVENOUS

## 2014-09-14 MED ORDER — MAGNESIUM CITRATE PO SOLN: 1.0000 | Freq: Once | ORAL | Status: AC | PRN

## 2014-09-14 MED ORDER — FENTANYL CITRATE (PF) 250 MCG/5ML IJ SOLN
INTRAMUSCULAR | Status: AC
  Filled 2014-09-14: qty 5

## 2014-09-14 MED ORDER — MIDAZOLAM HCL 2 MG/2ML IJ SOLN
INTRAMUSCULAR | Status: DC | PRN
  Administered 2014-09-14 (×2): 1 mg via INTRAVENOUS

## 2014-09-14 MED ORDER — CEFAZOLIN SODIUM-DEXTROSE 2-3 GM-% IV SOLR
INTRAVENOUS | Status: AC
  Filled 2014-09-14: qty 50

## 2014-09-14 MED ORDER — METOCLOPRAMIDE HCL 10 MG PO TABS: 5.0000 mg | ORAL_TABLET | Freq: Three times a day (TID) | ORAL | Status: DC | PRN

## 2014-09-14 MED ORDER — CEFAZOLIN SODIUM-DEXTROSE 2-3 GM-% IV SOLR
2.0000 g | INTRAVENOUS | Status: AC
  Administered 2014-09-14: 2 g via INTRAVENOUS

## 2014-09-14 MED ORDER — ROPIVACAINE HCL 5 MG/ML IJ SOLN
INTRAMUSCULAR | Status: DC | PRN
  Administered 2014-09-14: 30 mL via PERINEURAL

## 2014-09-14 MED ORDER — ONDANSETRON HCL 4 MG/2ML IJ SOLN
INTRAMUSCULAR | Status: DC | PRN
  Administered 2014-09-14: 4 mg via INTRAVENOUS

## 2014-09-14 MED ORDER — METHOCARBAMOL 500 MG PO TABS
500.0000 mg | ORAL_TABLET | Freq: Four times a day (QID) | ORAL | Status: DC | PRN
  Administered 2014-09-14 – 2014-09-15 (×2): 500 mg via ORAL
  Filled 2014-09-14 (×2): qty 1

## 2014-09-14 MED ORDER — HYDROMORPHONE HCL 1 MG/ML IJ SOLN
0.5000 mg | INTRAMUSCULAR | Status: DC | PRN
  Administered 2014-09-14: 0.5 mg via INTRAVENOUS
  Filled 2014-09-14: qty 1

## 2014-09-14 MED ORDER — DOCUSATE SODIUM 100 MG PO CAPS: 100.0000 mg | ORAL_CAPSULE | Freq: Two times a day (BID) | ORAL | Status: DC | PRN

## 2014-09-14 MED ORDER — TOPIRAMATE 25 MG PO TABS
150.0000 mg | ORAL_TABLET | Freq: Two times a day (BID) | ORAL | Status: DC
  Administered 2014-09-14 – 2014-09-15 (×2): 150 mg via ORAL
  Filled 2014-09-14 (×3): qty 2

## 2014-09-14 MED ORDER — ONDANSETRON HCL 4 MG/2ML IJ SOLN: 4.0000 mg | Freq: Four times a day (QID) | INTRAMUSCULAR | Status: DC | PRN

## 2014-09-14 MED ORDER — ROCURONIUM BROMIDE 100 MG/10ML IV SOLN
INTRAVENOUS | Status: DC | PRN
  Administered 2014-09-14: 20 mg via INTRAVENOUS

## 2014-09-14 MED ORDER — LAMOTRIGINE 100 MG PO TABS
100.0000 mg | ORAL_TABLET | Freq: Two times a day (BID) | ORAL | Status: DC
  Administered 2014-09-14 – 2014-09-15 (×2): 100 mg via ORAL
  Filled 2014-09-14 (×3): qty 1

## 2014-09-14 MED ORDER — BUPIVACAINE-EPINEPHRINE (PF) 0.5% -1:200000 IJ SOLN
INTRAMUSCULAR | Status: AC
  Filled 2014-09-14: qty 30

## 2014-09-14 MED ORDER — ACETAMINOPHEN 325 MG PO TABS: 650.0000 mg | ORAL_TABLET | Freq: Four times a day (QID) | ORAL | Status: DC | PRN

## 2014-09-14 MED ORDER — METHOCARBAMOL 1000 MG/10ML IJ SOLN
500.0000 mg | Freq: Four times a day (QID) | INTRAVENOUS | Status: DC | PRN
  Administered 2014-09-14: 500 mg via INTRAVENOUS
  Filled 2014-09-14 (×2): qty 5

## 2014-09-14 MED ORDER — RISAQUAD PO CAPS
1.0000 | ORAL_CAPSULE | Freq: Every day | ORAL | Status: DC
  Administered 2014-09-14 – 2014-09-15 (×2): 1 via ORAL
  Filled 2014-09-14 (×2): qty 1

## 2014-09-14 MED ORDER — GLYCOPYRROLATE 0.2 MG/ML IJ SOLN
INTRAMUSCULAR | Status: AC
  Filled 2014-09-14: qty 3

## 2014-09-14 MED ORDER — FENTANYL CITRATE (PF) 100 MCG/2ML IJ SOLN
INTRAMUSCULAR | Status: AC
  Filled 2014-09-14: qty 2

## 2014-09-14 MED ORDER — ONDANSETRON HCL 4 MG/2ML IJ SOLN
INTRAMUSCULAR | Status: AC
  Filled 2014-09-14: qty 2

## 2014-09-14 MED ORDER — NEOSTIGMINE METHYLSULFATE 10 MG/10ML IV SOLN
INTRAVENOUS | Status: AC
  Filled 2014-09-14: qty 1

## 2014-09-14 MED ORDER — ROCURONIUM BROMIDE 100 MG/10ML IV SOLN
INTRAVENOUS | Status: AC
  Filled 2014-09-14: qty 1

## 2014-09-14 MED ORDER — SENNOSIDES-DOCUSATE SODIUM 8.6-50 MG PO TABS: 1.0000 | ORAL_TABLET | Freq: Every evening | ORAL | Status: DC | PRN

## 2014-09-14 MED ORDER — FENTANYL CITRATE (PF) 100 MCG/2ML IJ SOLN: 25.0000 ug | INTRAMUSCULAR | Status: DC | PRN

## 2014-09-14 MED ORDER — LACTATED RINGERS IV SOLN
INTRAVENOUS | Status: DC | PRN
  Administered 2014-09-14: 09:00:00 via INTRAVENOUS

## 2014-09-14 MED ORDER — FENOFIBRATE 54 MG PO TABS
54.0000 mg | ORAL_TABLET | Freq: Every day | ORAL | Status: DC
  Administered 2014-09-14 – 2014-09-15 (×2): 54 mg via ORAL
  Filled 2014-09-14 (×2): qty 1

## 2014-09-14 MED ORDER — ONDANSETRON HCL 4 MG PO TABS: 4.0000 mg | ORAL_TABLET | Freq: Four times a day (QID) | ORAL | Status: DC | PRN

## 2014-09-14 MED ORDER — PROPOFOL 10 MG/ML IV BOLUS
INTRAVENOUS | Status: DC | PRN
  Administered 2014-09-14: 50 mg via INTRAVENOUS
  Administered 2014-09-14: 150 mg via INTRAVENOUS

## 2014-09-14 MED ORDER — LACTATED RINGERS IV SOLN: INTRAVENOUS | Status: DC

## 2014-09-14 MED ORDER — DOCUSATE SODIUM 100 MG PO CAPS
100.0000 mg | ORAL_CAPSULE | Freq: Two times a day (BID) | ORAL | Status: DC
  Administered 2014-09-15: 100 mg via ORAL
  Filled 2014-09-14 (×3): qty 1

## 2014-09-14 MED ORDER — PHENYLEPHRINE HCL 10 MG/ML IJ SOLN
INTRAMUSCULAR | Status: AC
  Filled 2014-09-14: qty 1

## 2014-09-14 MED ORDER — METOCLOPRAMIDE HCL 5 MG/ML IJ SOLN: 5.0000 mg | Freq: Three times a day (TID) | INTRAMUSCULAR | Status: DC | PRN

## 2014-09-14 MED ORDER — PHENOL 1.4 % MT LIQD
1.0000 | OROMUCOSAL | Status: DC | PRN
  Filled 2014-09-14: qty 177

## 2014-09-14 MED ORDER — ROPIVACAINE HCL 5 MG/ML IJ SOLN
INTRAMUSCULAR | Status: AC
  Filled 2014-09-14: qty 30

## 2014-09-14 MED ORDER — SODIUM CHLORIDE 0.9 % IR SOLN
Status: AC
  Filled 2014-09-14: qty 1

## 2014-09-14 MED ORDER — BERBERINE CHLORIDE POWD: 800.0000 mg | Freq: Every morning | Status: DC

## 2014-09-14 MED ORDER — UBIQUINOL 200 MG PO CAPS: 400.0000 mg | ORAL_CAPSULE | Freq: Every morning | ORAL | Status: DC

## 2014-09-14 MED ORDER — OXYCODONE-ACETAMINOPHEN 5-325 MG PO TABS: 1.0000 | ORAL_TABLET | ORAL | Status: DC | PRN

## 2014-09-14 MED ORDER — METOPROLOL TARTRATE 25 MG PO TABS
25.0000 mg | ORAL_TABLET | Freq: Two times a day (BID) | ORAL | Status: DC
  Administered 2014-09-14 – 2014-09-15 (×2): 25 mg via ORAL
  Filled 2014-09-14 (×3): qty 1

## 2014-09-14 MED ORDER — LORATADINE 10 MG PO TABS
10.0000 mg | ORAL_TABLET | Freq: Every day | ORAL | Status: DC | PRN
  Filled 2014-09-14: qty 1

## 2014-09-14 MED ORDER — KCL IN DEXTROSE-NACL 20-5-0.45 MEQ/L-%-% IV SOLN
INTRAVENOUS | Status: DC
  Administered 2014-09-14: 12:00:00 via INTRAVENOUS
  Filled 2014-09-14 (×2): qty 1000

## 2014-09-14 MED ORDER — BISACODYL 5 MG PO TBEC: 5.0000 mg | DELAYED_RELEASE_TABLET | Freq: Every day | ORAL | Status: DC | PRN

## 2014-09-14 MED ORDER — MENTHOL 3 MG MT LOZG
1.0000 | LOZENGE | OROMUCOSAL | Status: DC | PRN
  Filled 2014-09-14: qty 9

## 2014-09-14 MED ORDER — CEFAZOLIN SODIUM-DEXTROSE 2-3 GM-% IV SOLR
2.0000 g | Freq: Four times a day (QID) | INTRAVENOUS | Status: AC
  Administered 2014-09-14 – 2014-09-15 (×3): 2 g via INTRAVENOUS
  Filled 2014-09-14 (×3): qty 50

## 2014-09-14 MED ORDER — LIDOCAINE HCL (CARDIAC) 20 MG/ML IV SOLN
INTRAVENOUS | Status: DC | PRN
  Administered 2014-09-14: 50 mg via INTRAVENOUS

## 2014-09-14 MED ORDER — OXYCODONE HCL 5 MG PO TABS
5.0000 mg | ORAL_TABLET | ORAL | Status: DC | PRN
  Administered 2014-09-14 (×2): 5 mg via ORAL
  Administered 2014-09-15 (×2): 10 mg via ORAL
  Filled 2014-09-14: qty 2
  Filled 2014-09-14 (×3): qty 1
  Filled 2014-09-14: qty 2

## 2014-09-14 MED ORDER — BUPIVACAINE-EPINEPHRINE 0.5% -1:200000 IJ SOLN
INTRAMUSCULAR | Status: DC | PRN
  Administered 2014-09-14: 20 mL

## 2014-09-14 MED ORDER — PROPOFOL 10 MG/ML IV BOLUS
INTRAVENOUS | Status: AC
  Filled 2014-09-14: qty 20

## 2014-09-14 MED ORDER — ENOXAPARIN SODIUM 30 MG/0.3ML ~~LOC~~ SOLN
30.0000 mg | SUBCUTANEOUS | Status: DC
  Filled 2014-09-14 (×2): qty 0.3

## 2014-09-14 MED ORDER — SODIUM CHLORIDE 0.9 % IR SOLN
Status: DC | PRN
  Administered 2014-09-14: 500 mL

## 2014-09-14 MED ORDER — IRBESARTAN 75 MG PO TABS
37.5000 mg | ORAL_TABLET | Freq: Every day | ORAL | Status: DC
  Administered 2014-09-14 – 2014-09-15 (×2): 37.5 mg via ORAL
  Filled 2014-09-14 (×2): qty 0.5

## 2014-09-14 MED ORDER — FENTANYL CITRATE (PF) 100 MCG/2ML IJ SOLN
INTRAMUSCULAR | Status: DC | PRN
  Administered 2014-09-14: 50 ug via INTRAVENOUS

## 2014-09-14 MED ORDER — GLYCOPYRROLATE 0.2 MG/ML IJ SOLN
INTRAMUSCULAR | Status: DC | PRN
  Administered 2014-09-14: 0.4 mg via INTRAVENOUS

## 2014-09-14 SURGICAL SUPPLY — 51 items
ANCH SUT 2 CRKSRW FT 14X4.5 (Anchor) ×1 IMPLANT
ANCH SUT SWLK 19.1X4.75 VT (Anchor) ×1 IMPLANT
ANCHOR NDL 9/16 CIR SZ 8 (NEEDLE) IMPLANT
ANCHOR NEEDLE 9/16 CIR SZ 8 (NEEDLE) IMPLANT
ANCHOR PEEK 4.75X19.1 SWLK C (Anchor) ×1 IMPLANT
ANCHOR PEEK CORKSCREW 4.5 (Anchor) ×1 IMPLANT
BAG SPEC THK2 15X12 ZIP CLS (MISCELLANEOUS)
BAG ZIPLOCK 12X15 (MISCELLANEOUS) IMPLANT
BLADE OSCILLATING/SAGITTAL (BLADE)
BLADE SW THK.38XMED LNG THN (BLADE) IMPLANT
CLOTH 2% CHLOROHEXIDINE 3PK (PERSONAL CARE ITEMS) ×2 IMPLANT
DRAPE ORTHO SPLIT 77X108 STRL (DRAPES)
DRAPE POUCH INSTRU U-SHP 10X18 (DRAPES) ×2 IMPLANT
DRAPE SURG ORHT 6 SPLT 77X108 (DRAPES) IMPLANT
DRSG AQUACEL AG ADV 3.5X 4 (GAUZE/BANDAGES/DRESSINGS) IMPLANT
DRSG AQUACEL AG ADV 3.5X 6 (GAUZE/BANDAGES/DRESSINGS) ×1 IMPLANT
DURAPREP 26ML APPLICATOR (WOUND CARE) ×2 IMPLANT
ELECT NDL TIP 2.8 STRL (NEEDLE) ×1 IMPLANT
ELECT NEEDLE TIP 2.8 STRL (NEEDLE) ×2 IMPLANT
ELECT REM PT RETURN 9FT ADLT (ELECTROSURGICAL) ×2
ELECTRODE REM PT RTRN 9FT ADLT (ELECTROSURGICAL) ×1 IMPLANT
GLOVE BIOGEL PI IND STRL 7.5 (GLOVE) ×1 IMPLANT
GLOVE BIOGEL PI IND STRL 8 (GLOVE) ×1 IMPLANT
GLOVE BIOGEL PI INDICATOR 7.5 (GLOVE) ×6
GLOVE BIOGEL PI INDICATOR 8 (GLOVE) ×1
GLOVE SURG SS PI 7.5 STRL IVOR (GLOVE) ×2 IMPLANT
GLOVE SURG SS PI 8.0 STRL IVOR (GLOVE) ×4 IMPLANT
GOWN STRL REUS W/TWL XL LVL3 (GOWN DISPOSABLE) ×6 IMPLANT
KIT BASIN OR (CUSTOM PROCEDURE TRAY) ×2 IMPLANT
KIT POSITION SHOULDER SCHLEI (MISCELLANEOUS) ×2 IMPLANT
MANIFOLD NEPTUNE II (INSTRUMENTS) ×2 IMPLANT
NDL SCORPION MULTI FIRE (NEEDLE) IMPLANT
NEEDLE SCORPION MULTI FIRE (NEEDLE) ×2 IMPLANT
PACK SHOULDER (CUSTOM PROCEDURE TRAY) ×2 IMPLANT
PEN SKIN MARKING BROAD (MISCELLANEOUS) ×2 IMPLANT
POSITIONER SURGICAL ARM (MISCELLANEOUS) ×2 IMPLANT
SLING ARM IMMOBILIZER LRG (SOFTGOODS) IMPLANT
SLING ULTRA II L (ORTHOPEDIC SUPPLIES) IMPLANT
STRIP CLOSURE SKIN 1/2X4 (GAUZE/BANDAGES/DRESSINGS) ×2 IMPLANT
SUT BONE WAX W31G (SUTURE) ×1 IMPLANT
SUT ETHIBOND NAB CT1 #1 30IN (SUTURE) IMPLANT
SUT FIBERWIRE #2 38 T-5 BLUE (SUTURE)
SUT PROLENE 3 0 PS 2 (SUTURE) ×2 IMPLANT
SUT TIGER TAPE 7 IN WHITE (SUTURE) ×1 IMPLANT
SUT VIC AB 1-0 CT2 27 (SUTURE) ×3 IMPLANT
SUT VIC AB 2-0 CT2 27 (SUTURE) ×2 IMPLANT
SUT VICRYL 0-0 OS 2 NEEDLE (SUTURE) ×1 IMPLANT
SUTURE FIBERWR #2 38 T-5 BLUE (SUTURE) IMPLANT
TAPE FIBER 2MM 7IN #2 BLUE (SUTURE) IMPLANT
TOWEL OR 17X26 10 PK STRL BLUE (TOWEL DISPOSABLE) ×2 IMPLANT
TOWEL OR NON WOVEN STRL DISP B (DISPOSABLE) ×2 IMPLANT

## 2014-09-14 NOTE — Discharge Instructions (Signed)

## 2014-09-14 NOTE — Transfer of Care (Signed)
Immediate Anesthesia Transfer of Care Note  Patient: Christian Ross  Procedure(s) Performed: Procedure(s): LEFT SHOULDER MINI OPEN ROTATOR CUFF REPAIR AND SUBACROMIAL DECOMPRESSION  (Left)  Patient Location: PACU  Anesthesia Type:General  Level of Consciousness: awake, alert  and oriented  Airway & Oxygen Therapy: Patient Spontanous Breathing and Patient connected to face mask oxygen  Post-op Assessment: Report given to RN and Post -op Vital signs reviewed and stable  Post vital signs: Reviewed and stable  Last Vitals:  Filed Vitals:   09/14/14 0653  BP: 118/82  Pulse: 79  Temp: 36.9 C  Resp: 18    Complications: No apparent anesthesia complications

## 2014-09-14 NOTE — Progress Notes (Addendum)
PHARMACIST - PHYSICIAN ORDER COMMUNICATION  CONCERNING: P&T Medication Policy on Herbal Medications  DESCRIPTION:  This patient's order for: ubiquinol,berberine has been noted.  This product(s) is classified as an "herbal" or natural product. Due to a lack of definitive safety studies or FDA approval, nonstandard manufacturing practices, plus the potential risk of unknown drug-drug interactions while on inpatient medications, the Pharmacy and Therapeutics Committee does not permit the use of "herbal" or natural products of this type within [facility].   ACTION TAKEN: The pharmacy department is unable to verify this order at this time and medication has been discontinued per policy. Please reevaluate patient's clinical condition at discharge and address if the herbal or natural product(s) should be resumed at that time.  Royetta Asal, PharmD, BCPS 09/14/14 11:59

## 2014-09-14 NOTE — Progress Notes (Signed)
RT placed pt on auto titrate CPAP 5-20cmH2O with 2Lpm of oxygen bled in via ffm. Pt states he is comfortable and is tolerating CPAP well at this time. RT will continue to monitor as needed.

## 2014-09-14 NOTE — Anesthesia Preprocedure Evaluation (Addendum)
Anesthesia Evaluation  Patient identified by MRN, date of birth, ID band Patient awake    Reviewed: Allergy & Precautions, H&P , NPO status , Patient's Chart, lab work & pertinent test results, reviewed documented beta blocker date and time   Airway Mallampati: II  TM Distance: >3 FB Neck ROM: Full    Dental no notable dental hx. (+) Teeth Intact, Dental Advisory Given   Pulmonary sleep apnea ,  breath sounds clear to auscultation  Pulmonary exam normal       Cardiovascular hypertension, Pt. on medications and Pt. on home beta blockers Rhythm:Regular Rate:Normal     Neuro/Psych  Headaches, negative psych ROS   GI/Hepatic negative GI ROS, Neg liver ROS,   Endo/Other  negative endocrine ROS  Renal/GU negative Renal ROS  negative genitourinary   Musculoskeletal  (+) Arthritis -, Osteoarthritis,    Abdominal   Peds  Hematology negative hematology ROS (+)   Anesthesia Other Findings   Reproductive/Obstetrics negative OB ROS                           Anesthesia Physical Anesthesia Plan  ASA: III  Anesthesia Plan: General and Regional   Post-op Pain Management:    Induction: Intravenous  Airway Management Planned: Oral ETT  Additional Equipment:   Intra-op Plan:   Post-operative Plan: Extubation in OR  Informed Consent: I have reviewed the patients History and Physical, chart, labs and discussed the procedure including the risks, benefits and alternatives for the proposed anesthesia with the patient or authorized representative who has indicated his/her understanding and acceptance.   Dental advisory given  Plan Discussed with: CRNA  Anesthesia Plan Comments:         Anesthesia Quick Evaluation

## 2014-09-14 NOTE — Anesthesia Procedure Notes (Addendum)
Anesthesia Regional Block:  Interscalene brachial plexus block  Pre-Anesthetic Checklist: ,, timeout performed, Correct Patient, Correct Site, Correct Laterality, Correct Procedure, Correct Position, site marked, Risks and benefits discussed, pre-op evaluation,  At surgeon's request and post-op pain management  Laterality: Left  Prep: Maximum Sterile Barrier Precautions used and chloraprep       Needles:  Injection technique: Single-shot  Needle Type: Echogenic Stimulator Needle     Needle Length: 5cm 5 cm Needle Gauge: 22 and 22 G    Additional Needles:  Procedures: ultrasound guided (picture in chart) and nerve stimulator Interscalene brachial plexus block  Nerve Stimulator or Paresthesia:  Response: Biceps response,   Additional Responses:   Narrative:  Start time: 09/14/2014 8:50 AM End time: 09/14/2014 8:59 AM Injection made incrementally with aspirations every 5 mL. Anesthesiologist: Roderic Palau  Additional Notes: 2% Lidocaine skin wheel.    Procedure Name: Intubation Date/Time: 09/14/2014 9:24 AM Performed by: Glory Buff Pre-anesthesia Checklist: Patient identified, Emergency Drugs available, Suction available and Patient being monitored Patient Re-evaluated:Patient Re-evaluated prior to inductionOxygen Delivery Method: Circle System Utilized Preoxygenation: Pre-oxygenation with 100% oxygen Intubation Type: IV induction Ventilation: Mask ventilation without difficulty Laryngoscope Size: Miller and 3 Grade View: Grade I Tube type: Oral Tube size: 7.5 mm Number of attempts: 1 Airway Equipment and Method: Stylet and Oral airway Placement Confirmation: ETT inserted through vocal cords under direct vision,  positive ETCO2 and breath sounds checked- equal and bilateral Secured at: 21 cm Tube secured with: Tape Dental Injury: Teeth and Oropharynx as per pre-operative assessment

## 2014-09-14 NOTE — Interval H&P Note (Signed)
History and Physical Interval Note:  09/14/2014 7:30 AM  Christian Ross  has presented today for surgery, with the diagnosis of ROTATOR CUFF TEAR LEFT SHOULDER   The various methods of treatment have been discussed with the patient and family. After consideration of risks, benefits and other options for treatment, the patient has consented to  Procedure(s): LEFT SHOULDER MINI OPEN ROTATOR CUFF REPAIR AND SUBACROMIAL DECOMPRESSION  (Left) as a surgical intervention .  The patient's history has been reviewed, patient examined, no change in status, stable for surgery.  I have reviewed the patient's chart and labs.  Questions were answered to the patient's satisfaction.     Merick Kelleher C

## 2014-09-14 NOTE — Anesthesia Postprocedure Evaluation (Signed)
  Anesthesia Post-op Note  Patient: Christian Ross  Procedure(s) Performed: Procedure(s): LEFT SHOULDER MINI OPEN ROTATOR CUFF REPAIR AND SUBACROMIAL DECOMPRESSION  (Left)  Patient Location: PACU  Anesthesia Type:General and block  Level of Consciousness: awake and alert   Airway and Oxygen Therapy: Patient Spontanous Breathing  Post-op Pain: none  Post-op Assessment: Post-op Vital signs reviewed, Patient's Cardiovascular Status Stable and Respiratory Function Stable  Post-op Vital Signs: Reviewed  Filed Vitals:   09/14/14 1115  BP: 109/74  Pulse: 67  Temp:   Resp: 18    Complications: No apparent anesthesia complications

## 2014-09-14 NOTE — H&P (View-Only) (Signed)
Christian Ross is an 74 y.o. male.   Chief Complaint: L shoulder pain HPI: The patient is a 74 year old male who presents today for follow up of their shoulder. The patient is being followed for their left shoulder pain and impingement. They are 1 year, 1 1/2 months out from when symptoms began. Symptoms reported today include: pain, pain at night, aching, stiffness and pain with overhead motions. The patient feels that they are doing poorly. Current treatment includes: home exercise program and activity modification. The following medication has been used for pain control: none. The patient presents today following cortisone injection x 6 1/2 weeks. The patient has not gotten any relief of their symptoms with Cortisone injections.  Past Medical History  Diagnosis Date  . Hyperlipidemia   . Vitamin D deficiency   . Hypertension     orthostatic  . Hypertriglyceridemia   . Insulin resistance   . Erectile dysfunction   . Hemorrhoids   . Inguinal hernia   . Hx of colonic polyps   . Diverticulitis   . Diverticulosis   . Cancer     skin pre cancer nose  . 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 Knee surgery  1960    right  . 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 Colonoscopy w/ biopsies and polypectomy  multiple, 2005, 08, 14  . Nasal sinus surgery    . Rectal biopsy      Family History  Problem Relation Age of Onset  . Colon cancer Neg Hx    Social History:  reports that he has never smoked. He has never used smokeless tobacco. He reports that he drinks alcohol. He reports that he does not use illicit drugs.  Allergies:  Allergies  Allergen Reactions  . Lisinopril Cough    Causes a cough     (Not in a hospital admission)  No results found for this or any previous visit (from the past 48 hour(s)). No results found.  Review of Systems  Constitutional: Negative.   HENT: Negative.   Eyes: Negative.   Respiratory: Negative.    Cardiovascular: Negative.   Gastrointestinal: Negative.   Genitourinary: Negative.   Musculoskeletal: Positive for joint pain.  Skin: Negative.   Neurological: Negative.   Psychiatric/Behavioral: Negative.     There were no vitals taken for this visit. Physical Exam  Constitutional: He is oriented to person, place, and time. He appears well-developed and well-nourished.  HENT:  Head: Normocephalic and atraumatic.  Eyes: Conjunctivae and EOM are normal. Pupils are equal, round, and reactive to light.  Neck: Normal range of motion. Neck supple.  Cardiovascular: Normal rate and regular rhythm.   Respiratory: Effort normal and breath sounds normal.  GI: Soft. Bowel sounds are normal.  Musculoskeletal:  On exam, he has a positive impingement sign, positive secondary impingement sign of the shoulder. Weak in external rotation. Tender in the anterior subacromial region. Decreased internal rotation.  Inspection of the shoulder revealed no ecchymosis, soft tissue swelling or deformity. On palpation, nontender over the [facility]. On range of motion, the patient had full range of motion. Provocative signs indicated no sulcus sign, and negative speed's test. Negative lift off. Sensory exam was intact and motor function was normal in the deltoid and the rotator cuff.  Neurological: He is alert and oriented to person, place, and time. He has normal reflexes.  Skin: Skin is warm and dry.  Psychiatric: He has a  normal mood and affect.    Three view radiographs demonstrate a Type 2 acromion, decreased acromiohumeral distance.  MRI with full thickness tear supraspinatus with 2cm retraction. Delamination type tearing infraspinatus. 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  Assessment/Plan L shoulder RCT  Persistent rotator cuff arthropathy, tear, one and a half years status post injury with one and a half years of symptoms. No help from a corticosteroid injection, with decreased acromiohumeral  distance.  We discussed rotator cuff repair.  I had a long discussion with the patient concerning the risks and benefits of a left rotator cuff repair, including bleeding, infection, and prolonged postoperative recovery, which may require 3 to 5 months until maximum medical improvement. Overnight procedure with initiation of early passive range of motion within physical therapy. Avoid any active motion for the first six weeks. This is all in an effort to avoid recurrent tear of the rotator cuff and adhesive capsulitis. Return to work without use of the arm can be obtained following two weeks. However, driving will be a challenge. We also discussed the possibility of requiring implants including bone anchors, as well as an Allograft patch graft if a massive rotator cuff tear is encountered. Removal of any bones for spurs as well as bursitis will be performed during the procedure and also any associated anesthetic complications as well.  We will call him with that result. In the interim, he is to perform activity modification and use Tylenol at night.  Plan left shoulder mini-open rotator cuff repair, subacromial decompression  BISSELL, JACLYN M. PA-C for Dr. Tonita Cong 09/04/2014, 8:19 AM

## 2014-09-14 NOTE — Brief Op Note (Signed)
09/14/2014  10:35 AM  PATIENT:  Christian Ross  74 y.o. male  PRE-OPERATIVE DIAGNOSIS:  ROTATOR CUFF TEAR LEFT SHOULDER   POST-OPERATIVE DIAGNOSIS:  left rotator cull tear  PROCEDURE:  Procedure(s): LEFT SHOULDER MINI OPEN ROTATOR CUFF REPAIR AND SUBACROMIAL DECOMPRESSION  (Left)  SURGEON:  Surgeon(s) and Role:    * Susa Day, MD - Primary  PHYSICIAN ASSISTANT:   ASSISTANTS: Bissell   ANESTHESIA:   general  EBL:     BLOOD ADMINISTERED:none  DRAINS: none   LOCAL MEDICATIONS USED:  MARCAINE     SPECIMEN:  No Specimen  DISPOSITION OF SPECIMEN:  N/A  COUNTS:  YES  TOURNIQUET:  * No tourniquets in log *  DICTATION: .Other Dictation: Dictation Number [id number]  PLAN OF CARE: Admit for overnight observation  PATIENT DISPOSITION:  PACU - hemodynamically stable.   Delay start of Pharmacological VTE agent (>24hrs) due to surgical blood loss or risk of bleeding: no

## 2014-09-15 ENCOUNTER — Encounter (HOSPITAL_COMMUNITY): Payer: Self-pay | Admitting: Specialist

## 2014-09-15 DIAGNOSIS — M7542 Impingement syndrome of left shoulder: Secondary | ICD-10-CM | POA: Diagnosis not present

## 2014-09-15 DIAGNOSIS — M1990 Unspecified osteoarthritis, unspecified site: Secondary | ICD-10-CM | POA: Diagnosis not present

## 2014-09-15 DIAGNOSIS — M7502 Adhesive capsulitis of left shoulder: Secondary | ICD-10-CM | POA: Diagnosis not present

## 2014-09-15 DIAGNOSIS — M75122 Complete rotator cuff tear or rupture of left shoulder, not specified as traumatic: Secondary | ICD-10-CM | POA: Diagnosis not present

## 2014-09-15 DIAGNOSIS — G4730 Sleep apnea, unspecified: Secondary | ICD-10-CM | POA: Diagnosis not present

## 2014-09-15 DIAGNOSIS — N529 Male erectile dysfunction, unspecified: Secondary | ICD-10-CM | POA: Diagnosis not present

## 2014-09-15 LAB — BASIC METABOLIC PANEL
ANION GAP: 8 (ref 5–15)
BUN: 18 mg/dL (ref 6–20)
CHLORIDE: 105 mmol/L (ref 101–111)
CO2: 21 mmol/L — ABNORMAL LOW (ref 22–32)
Calcium: 8.5 mg/dL — ABNORMAL LOW (ref 8.9–10.3)
Creatinine, Ser: 1.33 mg/dL — ABNORMAL HIGH (ref 0.61–1.24)
GFR calc Af Amer: >60 mL/min (ref >60)
GFR, EST NON AFRICAN AMERICAN: 52 mL/min — AB (ref >60)
Glucose, Bld: 178 mg/dL — ABNORMAL HIGH (ref 65–99)
Potassium: 4.5 mmol/L (ref 3.5–5.1)
Sodium: 134 mmol/L — ABNORMAL LOW (ref 135–145)

## 2014-09-15 NOTE — Progress Notes (Signed)
Subjective: 1 Day Post-Op Procedure(s) (LRB): LEFT SHOULDER MINI OPEN ROTATOR CUFF REPAIR AND SUBACROMIAL DECOMPRESSION  (Left) Patient reports pain as mild. Reports pain well controlled. Block has worn off. No N/V. No other c/o.   Objective: Vital signs in last 24 hours: Temp:  [97.5 F (36.4 C)-99.4 F (37.4 C)] 99.2 F (37.3 C) (05/27 0445) Pulse Rate:  [60-91] 60 (05/27 0445) Resp:  [16-20] 20 (05/27 0445) BP: (106-137)/(63-80) 117/63 mmHg (05/27 0445) SpO2:  [93 %-100 %] 98 % (05/27 0445)  Intake/Output from previous day: 05/26 0701 - 05/27 0700 In: 2980.8 [P.O.:1260; I.V.:1565.8; IV Piggyback:155] Out: 1310 [Urine:1300; Blood:10] Intake/Output this shift:    No results for input(s): HGB in the last 72 hours. No results for input(s): WBC, RBC, HCT, PLT in the last 72 hours.  Recent Labs  09/15/14 0419  NA 134*  K 4.5  CL 105  CO2 21*  BUN 18  CREATININE 1.33*  GLUCOSE 178*  CALCIUM 8.5*   No results for input(s): LABPT, INR in the last 72 hours.  Neurologically intact ABD soft Neurovascular intact Sensation intact distally Intact pulses distally Dorsiflexion/Plantar flexion intact Incision: dressing C/D/I and no drainage No cellulitis present Compartment soft no calf pain or sign of DVT  Assessment/Plan: 1 Day Post-Op Procedure(s) (LRB): LEFT SHOULDER MINI OPEN ROTATOR CUFF REPAIR AND SUBACROMIAL DECOMPRESSION  (Left) Advance diet Up with therapy D/C IV fluids  Discussed D/C instructions, dressing instructions, limitations and precautions PT to see today for PROM, pendulum instruction D/C after PT as long as pain remains well controlled Follow up in 2 weeks for suture removal in office Will discuss with Dr. Mliss Fritz, Conley Rolls. 09/15/2014, 7:39 AM

## 2014-09-15 NOTE — Plan of Care (Signed)
Problem: Consults Goal: Diagnosis - Shoulder Surgery Outcome: Completed/Met Date Met:  09/15/14 Rotator Cuff LEFT

## 2014-09-15 NOTE — Discharge Summary (Signed)
Patient ID: Christian Ross MRN: [id number] DOB/AGE: [birth year] 74 y.o.  Admit date: 09/14/2014 Discharge date: 09/15/2014  Admission Diagnoses:  Principal Problem:   Left rotator cuff tear Active Problems:   Rotator cuff tear   Discharge Diagnoses:  Same  Past Medical History  Diagnosis Date  . Hyperlipidemia   . Vitamin D deficiency   . Hypertension     orthostatic  . Hypertriglyceridemia   . Insulin resistance   . Erectile dysfunction   . Hemorrhoids   . Hx of colonic polyps   . Diverticulitis   . Diverticulosis   . Cancer     skin pre cancer nose  . 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 Jannifer Franklin  . Rotator cuff tear     left  . Arthritis   . History of skin cancer     basal cell removed left arm 09/07/14  . Borderline diabetes     no meds / elevated HgA1C  . Sleep apnea     recently diagnosed / has not started using c-pap yet  . Cough May 2016    new x86mo, denies fever and shortness of breath    Surgeries: Procedure(s): LEFT SHOULDER MINI OPEN ROTATOR CUFF REPAIR AND SUBACROMIAL DECOMPRESSION  on 09/14/2014   Consultants:    Discharged Condition: Improved  Hospital Course: Christian Ross is an 74 y.o. male who was admitted 09/14/2014 for operative treatment ofLeft rotator cuff tear. Patient has severe unremitting pain that affects sleep, daily activities, and work/hobbies. After pre-op clearance the patient was taken to the operating room on 09/14/2014 and underwent  Procedure(s): LEFT SHOULDER MINI OPEN ROTATOR CUFF REPAIR AND SUBACROMIAL DECOMPRESSION .    Patient was given perioperative antibiotics: Anti-infectives    Start     Dose/Rate Route Frequency Ordered Stop   09/14/14 1600  ceFAZolin (ANCEF) IVPB 2 g/50 mL premix     2 g 100 mL/hr over 30 Minutes Intravenous Every 6 hours 09/14/14 1141 09/15/14 0449   09/14/14 0953  polymyxin B 500,000 Units, bacitracin 50,000 Units in sodium chloride irrigation 0.9 % 500 mL irrigation  Status:   Discontinued       As needed 09/14/14 0953 09/14/14 1046   09/14/14 0715  ceFAZolin (ANCEF) IVPB 2 g/50 mL premix     2 g 100 mL/hr over 30 Minutes Intravenous On call to O.R. 09/14/14 3545 09/14/14 0926       Patient was given sequential compression devices, early ambulation, and chemoprophylaxis to prevent DVT.  Patient benefited maximally from hospital stay and there were no complications.    Recent vital signs: Patient Vitals for the past 24 hrs:  BP Temp Temp src Pulse Resp SpO2  09/15/14 0445 117/63 mmHg 99.2 F (37.3 C) Oral 60 20 98 %  09/15/14 0125 113/79 mmHg 99.4 F (37.4 C) Axillary 91 20 93 %  09/14/14 2356 - - - 89 18 94 %  09/14/14 2040 133/77 mmHg 99.1 F (37.3 C) Oral 80 16 100 %  09/14/14 1453 110/71 mmHg 97.7 F (36.5 C) Oral 74 18 100 %  09/14/14 1332 113/80 mmHg 97.5 F (36.4 C) Oral 65 18 100 %  09/14/14 1239 116/78 mmHg 97.5 F (36.4 C) Oral 67 16 97 %  09/14/14 1135 106/76 mmHg 97.5 F (36.4 C) - 68 16 96 %  09/14/14 1115 109/74 mmHg - - 67 18 100 %  09/14/14 1100 119/76 mmHg - - 71 18 100 %  09/14/14 1050 137/79 mmHg 97.6  F (36.4 C) - 79 20 100 %     Recent laboratory studies:  Recent Labs  09/15/14 0419  NA 134*  K 4.5  CL 105  CO2 21*  BUN 18  CREATININE 1.33*  GLUCOSE 178*  CALCIUM 8.5*     Discharge Medications:     Medication List    TAKE these medications        aspirin 81 MG tablet  Take 81 mg by mouth every evening.     Berberine Chloride Powd  Take 800 mg by mouth every morning.     DIOVAN 160 MG tablet  Generic drug:  valsartan  TAKE 1 TABLET DAILY     docusate sodium 100 MG capsule  Commonly known as:  COLACE  Take 1 capsule (100 mg total) by mouth 2 (two) times daily as needed for mild constipation.     glucosamine-chondroitin 500-400 MG tablet  Take 2 tablets by mouth every morning.     lamoTRIgine 25 MG tablet  Commonly known as:  LAMICTAL  Take 4 tablets (100 mg total) by mouth 2 (two) times daily.      Lecithin 1200 MG Caps  Take 2 capsules by mouth every morning.     loratadine 10 MG tablet  Commonly known as:  CLARITIN  Take 10 mg by mouth daily as needed for allergies.     metoprolol tartrate 25 MG tablet  Commonly known as:  LOPRESSOR  TAKE 1 TABLET TWICE A DAY     mometasone 50 MCG/ACT nasal spray  Commonly known as:  NASONEX  Place 2 sprays into the nose daily as needed (allergies).     omega-3 acid ethyl esters 1 G capsule  Commonly known as:  LOVAZA  Take 1 g by mouth 3 (three) times daily.     OVER THE COUNTER MEDICATION  - Take 4 capsules by mouth every morning. --Administrator-- (totals for 4 capsules)  - Vitamin A - 5000 units  - Vitamin C- 100 mg  - Zinc- 25 mg  - Copper- 2 mg  - Toriene - 600 mg  - Carrot Powder  - Nactylsystine - 260 mg  - Night Vision Blend- Billberry extract and Black Current Extrac-- 230 mg   - shisadra extract -- 160 mg  - Eye Bright Extract-- 60 mg  - L glutamic acid-- 60mg   - Glysine -- 60 mg  - Alpha Lipoic Acid-- 10 mg  - Lycapine - 3 mg  - Zeaxanthine- 2 mg     OVER THE COUNTER MEDICATION  Take 2 tablets by mouth every morning. Cool Cayenne     oxyCODONE-acetaminophen 5-325 MG per tablet  Commonly known as:  PERCOCET  Take 1 tablet by mouth every 4 (four) hours as needed.     rosuvastatin 10 MG tablet  Commonly known as:  CRESTOR  Take 1 tablet (10 mg total) by mouth daily.     topiramate 25 MG tablet  Commonly known as:  TOPAMAX  6 tablets twice a day     TRICOR 145 MG tablet  Generic drug:  fenofibrate  TAKE 1 TABLET EVERY EVENING     Ubiquinol 200 MG Caps  Take 400 mg by mouth every morning.     vitamin C 1000 MG tablet  Take 2,000 mg by mouth daily.     VITAMIN C PO  Take 3 tablets by mouth every morning.        Diagnostic Studies: Dg Eye Foreign Body  08/18/2014  CLINICAL DATA:  Metal working/exposure; clearance prior to MRI  EXAM: ORBITS FOR FOREIGN BODY - 2 VIEW  COMPARISON:   None.  FINDINGS: There is no evidence of metallic foreign body within the orbits. No significant bone abnormality identified.  IMPRESSION: No evidence of metallic foreign body within the orbits.   Electronically Signed   By: Rolm Baptise M.D.   On: 08/18/2014 09:32   Dg Shoulder Left Port  09/14/2014   CLINICAL DATA:  Status post left mini rotator cuff repair  EXAM: LEFT SHOULDER - 1 VIEW  COMPARISON:  None.  FINDINGS: Air is noted along the course of the deltoid consistent with the recent surgery. No acute fracture or dislocation is noted. Mild left basilar atelectasis is noted  IMPRESSION: Postsurgical changes.  Mild left basilar atelectasis.   Electronically Signed   By: Inez Catalina M.D.   On: 09/14/2014 11:35    Disposition: 01-Home or Self Care      Discharge Instructions    Call MD / Call 911    Complete by:  As directed   If you experience chest pain or shortness of breath, CALL 911 and be transported to the hospital emergency room.  If you develope a fever above 101 F, pus (white drainage) or increased drainage or redness at the wound, or calf pain, call your surgeon's office.     Constipation Prevention    Complete by:  As directed   Drink plenty of fluids.  Prune juice may be helpful.  You may use a stool softener, such as Colace (over the counter) 100 mg twice a day.  Use MiraLax (over the counter) for constipation as needed.     Diet - low sodium heart healthy    Complete by:  As directed      Increase activity slowly as tolerated    Complete by:  As directed            Follow-up Information    Follow up with BEANE,JEFFREY C, MD In 2 weeks.   Specialty:  Orthopedic Surgery   Why:  For suture removal   Contact information:   [address] [place] [address] [phone]        Signed: Cecilie Kicks. 09/15/2014, 7:41 AM

## 2014-09-15 NOTE — Op Note (Signed)
NAMEMarland Kitchen  Christian Ross, Christian Ross NO.:  [id number]  MEDICAL RECORD NO.:  [id number]  LOCATION:  35                         FACILITY:  [facility]  PHYSICIAN:  Susa Day, M.D.    DATE OF BIRTH:  14-Sep-1940  DATE OF PROCEDURE:  09/14/2014 DATE OF DISCHARGE:                              OPERATIVE REPORT   PREOPERATIVE DIAGNOSES:  Full-thickness rotator cuff tear, impingement syndrome, adhesive capsulitis of left shoulder.  POSTOPERATIVE DIAGNOSES:  Full-thickness rotator cuff tear, impingement syndrome, adhesive capsulitis of left shoulder.  PROCEDURES PERFORMED: 1. Examination followed by manipulation under anesthesia. 2. Mini open rotator cuff repair with PushLock suture anchors,     subacromial decompression, acromioplasty.  ANESTHESIA:  General.  ASSISTANT:  Cleophas Dunker, PA.  HISTORY:  A 74 year old, shoulder pain, refractory, limited motion, full- thickness tear, retractor on the MRI, 2 cm, supraspinatus, no atrophy, infraspinatus partially tearing, loss motion, indicated for mini open rotator cuff repair and exam, and manipulation.  Risks and benefits were discussed including bleeding, infection, DVT, PE, anesthetic complications, etc.  TECHNIQUE:  With the patient in supine beach-chair position, after induction of adequate general anesthesia, 2 g of Kefzol, left shoulder and upper extremity were prepped and draped in usual sterile fashion. We performed gentle manipulative maneuver, abduction to 100, forward elevation to 160, improved from 140 with gentle manipulative pressure prior to the proximal humerus.  Slightly decreased external rotation, did not improve that much.  We then proceeded with mini open rotator cuff repair.  I made a 2-cm incision over the anterolateral aspect of the acromion.  Subcutaneous tissue was dissected.  Electrocautery was utilized to achieve hemostasis.  A 0.25% Marcaine with epinephrine was infiltrated in the deltoid, divided  the raphe between the anterior and middle heads, in line with the skin incision.  Self-retaining retractor was placed.  I excised the portion of the CA ligament, spur off the anterolateral aspect of the acromion, was removed with 3-mm Kerrison.  A large spur off the greater tuberosity was removed with a bare rongeur. This fairly tight.  We have spent conservative time lysing bursal side adhesions as well as articular side adhesions, mobilizing the cuff. There was complete tearing of the supraspinatus, retracted medially. The infraspinatus slightly retracted posteriorly.  Decorticated lateral to the articular surface.  Then, after multiple attempts with the supraspinatus, the tendon was fairly shredded each attempt to capture the tendon, was met with de-fragmentation.  We copiously irrigated the wound.  I felt the only repairable tendon was the infraspinatus and a portion of the supraspinatus posteriorly.  I therefore put a suture anchor in the cancellous bed posterolaterally using an awl just lateral to the articular surface and then threaded the fiber tape through the infraspinatus and supraspinatus posteriorly and advanced it anteriorly and laterally approximately a centimeter and secured it to the greater tuberosity with a PushLock after using an awl and inserted into the PushLock and without undue tension, securing it with coverage into the cancellous bed and greater coverage than he had prior to this advancement and securing.  Again, the gap from the supraspinatus remained.  I did feel this coverage posteriorly would help to depress the humeral head.  I gave him  some functionality along with the subscap. Copiously irrigated the wound.  Ranged them and he had an intact repair. We repaired the raphe with 1 Vicryl, subcu with 2-0, and skin with subcuticular Prolene.  Sterile dressing applied.  Placed in a sling, extubated without difficulty, and transported to the recovery room  in satisfactory condition.  The patient tolerated the procedure well.  No complications.  Assistant, Cleophas Dunker, PA, was used for retraction, traction, exposure and closure.  Minimal blood loss.     Susa Day, M.D.     [name]  D:  09/14/2014  T:  09/15/2014  Job:  [id number]

## 2014-09-15 NOTE — Progress Notes (Signed)
Occupational Therapy Evaluation Addendum:    2014/09/28 1000  OT G-codes **NOT FOR INPATIENT CLASS**  Functional Limitation Self care  Self Care Current Status (F5953) CI  Self Care Goal Status (X6728) CI  Self Care Discharge Status [phone]) CI  Lucille Passy, OTR/L [phone]

## 2014-09-15 NOTE — Evaluation (Signed)
Occupational Therapy Evaluation Patient Details Name: Christian Ross MRN: [id number] DOB: [birth year] Today's Date: 09/15/2014    History of Present Illness This 74 y.o. male admitted for Lt RCR due to full thickness RC tear, impingement syndrome, and adhesive capsulitis    Clinical Impression   Patient evaluated by Occupational Therapy with no further acute OT needs identified. All education has been completed and the patient has no further questions. All education completed.   See below for any follow-up Occupational Therapy or equipment needs. OT is signing off. Thank you for this referral.      Follow Up Recommendations  No OT follow up;Supervision - Intermittent    Equipment Recommendations  None recommended by OT    Recommendations for Other Services       Precautions / Restrictions Precautions Precautions: Shoulder Type of Shoulder Precautions: Sling all times; ok for pendulums, NO AROM shoulder; AROM elbow, wrist, and hand  Shoulder Interventions: Shoulder sling/immobilizer;At all times;Off for dressing/bathing/exercises Precaution Booklet Issued: Yes (comment) Precaution Comments: Pt demonstrates good understanding of precautions       Mobility Bed Mobility                  Transfers Overall transfer level: Modified independent Equipment used: None                  Balance Overall balance assessment: No apparent balance deficits (not formally assessed)                                          ADL Overall ADL's : Needs assistance/impaired Eating/Feeding: Modified independent   Grooming: Wash/dry hands;Wash/dry face;Oral care;Modified independent   Upper Body Bathing: Minimal assitance;Sitting;Standing   Lower Body Bathing: Minimal assistance;Sit to/from stand   Upper Body Dressing : Moderate assistance;Sitting;Standing   Lower Body Dressing: Minimal assistance;Sit to/from stand   Toilet Transfer: Modified  Independent;Ambulation;Comfort height toilet;Regular Toilet   Toileting- Clothing Manipulation and Hygiene: Moderate assistance;Sit to/from stand       Functional mobility during ADLs: Modified independent       Vision     Perception     Praxis      Pertinent Vitals/Pain Pain Assessment: 0-10 Pain Score: 2  Pain Location: Lt shoulder  Pain Descriptors / Indicators: Aching Pain Intervention(s): Monitored during session;Premedicated before session     Hand Dominance Right   Extremity/Trunk Assessment Upper Extremity Assessment Upper Extremity Assessment: LUE deficits/detail LUE Deficits / Details: elbow distall WFL.    Lower Extremity Assessment Lower Extremity Assessment: Overall WFL for tasks assessed   Cervical / Trunk Assessment Cervical / Trunk Assessment: Normal   Communication Communication Communication: No difficulties   Cognition Arousal/Alertness: Awake/alert Behavior During Therapy: WFL for tasks assessed/performed Overall Cognitive Status: Within Functional Limits for tasks assessed                     General Comments       Exercises Exercises: Shoulder     Shoulder Instructions Shoulder Instructions Donning/doffing shirt without moving shoulder: Caregiver independent with task;Patient able to independently direct caregiver Method for sponge bathing under operated UE: Modified independent;Caregiver independent with task;Patient able to independently direct caregiver Donning/doffing sling/immobilizer: Moderate assistance;Caregiver independent with task;Patient able to independently direct caregiver Correct positioning of sling/immobilizer: Caregiver independent with task;Patient able to independently direct caregiver;Modified independent Pendulum exercises (written home exercise program): Supervision/safety ROM for  elbow, wrist and digits of operated UE: Modified independent Sling wearing schedule (on at all times/off for ADL's):  Independent;Caregiver independent with task;Patient able to independently direct caregiver Proper positioning of operated UE when showering: Independent;Patient able to independently direct caregiver Positioning of UE while sleeping: Modified independent;Patient able to independently direct caregiver    Home Living Family/patient expects to be discharged to:: Private residence Living Arrangements: Spouse/significant other Available Help at Discharge: Family;Available 24 hours/day Type of Home: House             Bathroom Shower/Tub: Walk-in shower                    Prior Functioning/Environment Level of Independence: Independent        Comments: Pt reports he had very limited shoulder ROM PTA, so was utilizing one handed dressing techniques.  He was otherwise independent.  Denies falls or balance deificts    OT Diagnosis: Generalized weakness;Acute pain   OT Problem List: Decreased strength;Decreased range of motion;Pain;Decreased knowledge of precautions   OT Treatment/Interventions:      OT Goals(Current goals can be found in the care plan section) Acute Rehab OT Goals OT Goal Formulation: All assessment and education complete, DC therapy  OT Frequency:     Barriers to D/C:            Co-evaluation              End of Session Equipment Utilized During Treatment: Other (comment) (sling ) Nurse Communication: Mobility status  Activity Tolerance: Patient tolerated treatment well Patient left: in chair;with call bell/phone within reach;with family/visitor present   Time: 9678-9381 OT Time Calculation (min): 38 min Charges:  OT General Charges $OT Visit: 1 Procedure OT Evaluation $Initial OT Evaluation Tier I: 1 Procedure OT Treatments $Self Care/Home Management : 8-22 mins $Therapeutic Exercise: 8-22 mins G-Codes:    Astryd Pearcy M October 10, 2014, 10:28 AM

## 2014-09-28 DIAGNOSIS — Z4789 Encounter for other orthopedic aftercare: Secondary | ICD-10-CM | POA: Diagnosis not present

## 2014-10-03 DIAGNOSIS — H2513 Age-related nuclear cataract, bilateral: Secondary | ICD-10-CM | POA: Diagnosis not present

## 2014-10-03 DIAGNOSIS — H01002 Unspecified blepharitis right lower eyelid: Secondary | ICD-10-CM | POA: Diagnosis not present

## 2014-10-03 DIAGNOSIS — H01001 Unspecified blepharitis right upper eyelid: Secondary | ICD-10-CM | POA: Diagnosis not present

## 2014-10-13 DIAGNOSIS — S46012D Strain of muscle(s) and tendon(s) of the rotator cuff of left shoulder, subsequent encounter: Secondary | ICD-10-CM | POA: Diagnosis not present

## 2014-10-16 ENCOUNTER — Other Ambulatory Visit: Payer: Self-pay

## 2014-10-17 DIAGNOSIS — S46012D Strain of muscle(s) and tendon(s) of the rotator cuff of left shoulder, subsequent encounter: Secondary | ICD-10-CM | POA: Diagnosis not present

## 2014-10-20 DIAGNOSIS — S46012D Strain of muscle(s) and tendon(s) of the rotator cuff of left shoulder, subsequent encounter: Secondary | ICD-10-CM | POA: Diagnosis not present

## 2014-10-24 DIAGNOSIS — S46012D Strain of muscle(s) and tendon(s) of the rotator cuff of left shoulder, subsequent encounter: Secondary | ICD-10-CM | POA: Diagnosis not present

## 2014-10-26 DIAGNOSIS — G4733 Obstructive sleep apnea (adult) (pediatric): Secondary | ICD-10-CM | POA: Diagnosis not present

## 2014-10-27 DIAGNOSIS — S46012D Strain of muscle(s) and tendon(s) of the rotator cuff of left shoulder, subsequent encounter: Secondary | ICD-10-CM | POA: Diagnosis not present

## 2014-10-31 DIAGNOSIS — S46012D Strain of muscle(s) and tendon(s) of the rotator cuff of left shoulder, subsequent encounter: Secondary | ICD-10-CM | POA: Diagnosis not present

## 2014-11-02 DIAGNOSIS — S46012D Strain of muscle(s) and tendon(s) of the rotator cuff of left shoulder, subsequent encounter: Secondary | ICD-10-CM | POA: Diagnosis not present

## 2014-11-07 DIAGNOSIS — S46012D Strain of muscle(s) and tendon(s) of the rotator cuff of left shoulder, subsequent encounter: Secondary | ICD-10-CM | POA: Diagnosis not present

## 2014-11-09 DIAGNOSIS — S46012D Strain of muscle(s) and tendon(s) of the rotator cuff of left shoulder, subsequent encounter: Secondary | ICD-10-CM | POA: Diagnosis not present

## 2014-11-10 DIAGNOSIS — S46012D Strain of muscle(s) and tendon(s) of the rotator cuff of left shoulder, subsequent encounter: Secondary | ICD-10-CM | POA: Diagnosis not present

## 2014-11-14 DIAGNOSIS — S46012D Strain of muscle(s) and tendon(s) of the rotator cuff of left shoulder, subsequent encounter: Secondary | ICD-10-CM | POA: Diagnosis not present

## 2014-11-16 ENCOUNTER — Encounter (HOSPITAL_COMMUNITY): Payer: Self-pay | Admitting: Emergency Medicine

## 2014-11-16 ENCOUNTER — Emergency Department (HOSPITAL_COMMUNITY)
Admission: EM | Admit: 2014-11-16 | Discharge: 2014-11-17 | Disposition: A | Discharge status: Home / Self Care | Payer: Medicare Other | Attending: Emergency Medicine | Admitting: Emergency Medicine

## 2014-11-16 DIAGNOSIS — Z7982 Long term (current) use of aspirin: Secondary | ICD-10-CM | POA: Diagnosis not present

## 2014-11-16 DIAGNOSIS — Z79899 Other long term (current) drug therapy: Secondary | ICD-10-CM | POA: Insufficient documentation

## 2014-11-16 DIAGNOSIS — I10 Essential (primary) hypertension: Secondary | ICD-10-CM | POA: Diagnosis not present

## 2014-11-16 DIAGNOSIS — M1990 Unspecified osteoarthritis, unspecified site: Secondary | ICD-10-CM | POA: Insufficient documentation

## 2014-11-16 DIAGNOSIS — R350 Frequency of micturition: Secondary | ICD-10-CM | POA: Diagnosis not present

## 2014-11-16 DIAGNOSIS — E781 Pure hyperglyceridemia: Secondary | ICD-10-CM | POA: Insufficient documentation

## 2014-11-16 DIAGNOSIS — E785 Hyperlipidemia, unspecified: Secondary | ICD-10-CM | POA: Insufficient documentation

## 2014-11-16 DIAGNOSIS — Z8719 Personal history of other diseases of the digestive system: Secondary | ICD-10-CM | POA: Insufficient documentation

## 2014-11-16 DIAGNOSIS — Z86010 Personal history of colonic polyps: Secondary | ICD-10-CM | POA: Insufficient documentation

## 2014-11-16 DIAGNOSIS — Z85828 Personal history of other malignant neoplasm of skin: Secondary | ICD-10-CM | POA: Insufficient documentation

## 2014-11-16 DIAGNOSIS — R339 Retention of urine, unspecified: Secondary | ICD-10-CM | POA: Insufficient documentation

## 2014-11-16 DIAGNOSIS — R300 Dysuria: Secondary | ICD-10-CM | POA: Diagnosis present

## 2014-11-16 DIAGNOSIS — S46012D Strain of muscle(s) and tendon(s) of the rotator cuff of left shoulder, subsequent encounter: Secondary | ICD-10-CM | POA: Diagnosis not present

## 2014-11-16 DIAGNOSIS — R1084 Generalized abdominal pain: Secondary | ICD-10-CM | POA: Diagnosis not present

## 2014-11-16 NOTE — ED Notes (Signed)
Pt arrived to the ED with a complaint of dsyuria.  Pt states the pain and inability to create a viable stream of urine came on at noon today.  Pt states his last good urine was this am.  Pt states he has never been told he has prostrate problems.  Pt states he gets a dribble of urine which provides a little relief.  Pt is having pain in the lower pelvis

## 2014-11-17 LAB — URINALYSIS, ROUTINE W REFLEX MICROSCOPIC
Bilirubin Urine: NEGATIVE
GLUCOSE, UA: NEGATIVE mg/dL
Hgb urine dipstick: NEGATIVE
Ketones, ur: NEGATIVE mg/dL
Leukocytes, UA: NEGATIVE
NITRITE: NEGATIVE
PROTEIN: NEGATIVE mg/dL
SPECIFIC GRAVITY, URINE: 1.02 (ref 1.005–1.030)
UROBILINOGEN UA: 0.2 mg/dL (ref 0.0–1.0)
pH: 5.5 (ref 5.0–8.0)

## 2014-11-17 MED ORDER — TAMSULOSIN HCL 0.4 MG PO CAPS: ORAL_CAPSULE | ORAL | Status: DC

## 2014-11-17 NOTE — Discharge Instructions (Signed)
Start the flomax, it can help with urination and hopefully prevent your inability to urinate. Your bladder was 244 cc before you urinated in the ED and you urinated about 220 cc's. Afterwards your bladder scan was only 26 cc. Call Dr Simone Curia office to be rechecked by a urologist. Return to the ED if you are unable to urinate and your abdomen gets swollen again.    Acute Urinary Retention Acute urinary retention is when you are unable to pee (urinate). Acute urinary retention is common in older men. Prostates can get bigger, which blocks the flow of pee.  HOME CARE  Drink enough fluids to keep your pee clear or pale yellow.  If you are sent home with a tube that drains the bladder (catheter), there will be a drainage bag attached to it. There are two types of bags. One is big that you can wear at night without having to empty it. One is smaller and needs to be emptied more often.  Keep the drainage bag empty.  Keep the drainage bag lower than your catheter.  Only take medicine as told by your doctor. GET HELP IF:  You have a low-grade fever.  You have spasms or you are leaking pee when you have spasms. GET HELP RIGHT AWAY IF:   You have chills or a fever.  Your catheter stops draining pee.  Your catheter falls out.  You have increased bleeding that does not stop after you have rested and increased the amount of fluids you had been drinking. MAKE SURE YOU:   Understand these instructions.  Will watch your condition.  Will get help right away if you are not doing well or get worse. Document Released: 09/24/2007 Document Revised: 01/26/2013 Document Reviewed: 09/16/2012 [facility] Patient Information 2015 [place], [place]. This information is not intended to replace advice given to you by your health care provider. Make sure you discuss any questions you have with your health care provider.

## 2014-11-17 NOTE — ED Provider Notes (Signed)
CSN: [id number]     Arrival date & time 11/16/14  2058 History  This chart was scribed for Christian Porter, MD by Meriel Pica, ED Scribe. This patient was seen in room WA06/WA06 and the patient's care was started 12:56 AM.    Chief Complaint  Patient presents with  . Dysuria    The history is provided by the patient. No language interpreter was used.   HPI Comments: Christian Ross is a 74 y.o. male, with a PMhx of HLD and HTN, who presents to the Emergency Department complaining of sudden onset, intermittent, moderate suprapubic abdominal pressure onset 12 hours ago.  He additionally reports a decreased urine volume and describes he is only dribbling and still has the feeling he needs to urinate, and the feeling of urgency, frequency, and urinary retention. He states upon urinating he feels a brief relief of the pressure. Pt denies currently smoking tobacco or taking any new medication. Additionally denies abdominal distention, experiencing similar complaints in the past, nausea, vomiting, or hematuria.   PCP Dr Orland Mustard No urologist  Past Medical History  Diagnosis Date  . Hyperlipidemia   . Vitamin D deficiency   . Hypertension     orthostatic  . Hypertriglyceridemia   . Insulin resistance   . Erectile dysfunction   . Hemorrhoids   . Hx of colonic polyps   . Diverticulitis   . Diverticulosis   . Cancer     skin pre cancer nose  . 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 Jannifer Franklin  . Rotator cuff tear     left  . Arthritis   . History of skin cancer     basal cell removed left arm 09/07/14  . Borderline diabetes     no meds / elevated HgA1C  . Sleep apnea     recently diagnosed / has not started using c-pap yet  . Cough May 2016    new x12mo, denies fever and shortness of breath   Past Surgical History  Procedure Laterality Date  . Knee surgery  1960    right / for [place]  . Colonoscopy w/ biopsies and polypectomy  multiple, 2005, 08, 14  . Nasal sinus surgery     . Rectal biopsy    . Shoulder open rotator cuff repair Left 09/14/2014    Procedure: LEFT SHOULDER MINI OPEN ROTATOR CUFF REPAIR AND SUBACROMIAL DECOMPRESSION ;  Surgeon: Susa Day, MD;  Location: [facility];  Service: Orthopedics;  Laterality: Left;   Family History  Problem Relation Age of Onset  . Colon cancer Neg Hx    History  Substance Use Topics  . Smoking status: Never Smoker   . Smokeless tobacco: Never Used  . Alcohol Use: Yes     Comment: rarely  lives at home Lives with spouse  Review of Systems  Gastrointestinal: Negative for nausea, vomiting and abdominal distention.  Genitourinary: Positive for dysuria, urgency, frequency and decreased urine volume. Negative for hematuria.  All other systems reviewed and are negative.   Allergies  Lisinopril  Home Medications   Prior to Admission medications   Medication Sig Start Date End Date Taking? Authorizing Provider  Ascorbic Acid (VITAMIN C PO) Take 3 g by mouth every morning.    Yes Historical Provider, MD  Berberine Chloride POWD Take 800 mg by mouth every morning.    Yes Historical Provider, MD  DIOVAN 160 MG tablet TAKE 1 TABLET DAILY Patient taking differently: TAKE 1 TABLET DAILY IN THE EVENING.  02/20/14  Yes Thayer Headings, MD  erythromycin ophthalmic ointment Place 1 application into both eyes at bedtime as needed (for eye redness).  10/23/14  Yes Historical Provider, MD  glucosamine-chondroitin 500-400 MG tablet Take 2 tablets by mouth every morning.    Yes Historical Provider, MD  lamoTRIgine (LAMICTAL) 25 MG tablet Take 4 tablets (100 mg total) by mouth 2 (two) times daily. 03/13/14  Yes Kathrynn Ducking, MD  Lecithin 1200 MG CAPS Take 2 capsules by mouth every morning.   Yes Historical Provider, MD  metoprolol tartrate (LOPRESSOR) 25 MG tablet TAKE 1 TABLET TWICE A DAY 07/27/14  Yes Thayer Headings, MD  omega-3 acid ethyl esters (LOVAZA) 1 G capsule Take 3 g by mouth daily.    Yes Historical Provider, MD   Ophthalmic Irrigation Solution (OCUSOFT EYE [place]) SOLN Apply 1 application to eye as needed (dry eyes).   Yes Historical Provider, MD  OVER THE COUNTER MEDICATION Take 2 tablets by mouth every morning. [place]   Yes Historical Provider, MD  topiramate (TOPAMAX) 25 MG tablet 6 tablets twice a day Patient taking differently: Take 150 mg by mouth 2 (two) times daily. 6 tablets twice a day 02/14/14  Yes Kathrynn Ducking, MD  Ubiquinol 200 MG CAPS Take 400 mg by mouth every morning.   Yes Historical Provider, MD  aspirin 81 MG tablet Take 81 mg by mouth every evening.     Historical Provider, MD  docusate sodium (COLACE) 100 MG capsule Take 1 capsule (100 mg total) by mouth 2 (two) times daily as needed for mild constipation. Patient not taking: Reported on 11/17/2014 09/14/14   Susa Day, MD  loratadine (CLARITIN) 10 MG tablet Take 10 mg by mouth daily as needed for allergies.    Historical Provider, MD  mometasone (NASONEX) 50 MCG/ACT nasal spray Place 2 sprays into the nose daily as needed (allergies).    Historical Provider, MD  oxyCODONE-acetaminophen (PERCOCET) 5-325 MG per tablet Take 1 tablet by mouth every 4 (four) hours as needed. Patient not taking: Reported on 11/17/2014 09/14/14   Susa Day, MD  rosuvastatin (CRESTOR) 10 MG tablet Take 1 tablet (10 mg total) by mouth daily. Patient taking differently: Take 10 mg by mouth at bedtime.  07/25/14   Thayer Headings, MD  tamsulosin (FLOMAX) 0.4 MG CAPS capsule Take 1 po QD until you pass the stone. 11/17/14   Christian Porter, MD  TRICOR 145 MG tablet TAKE 1 TABLET EVERY EVENING 05/18/14   Thayer Headings, MD   BP 137/80 mmHg  Pulse 72  Temp(Src) 98.3 F (36.8 C) (Oral)  Resp 16  Ht 6' (1.829 m)  Wt 203 lb (92.08 kg)  BMI 27.53 kg/m2  SpO2 97%  Vital signs normal   Physical Exam  Constitutional: He is oriented to person, place, and time. He appears well-developed and well-nourished.  Non-toxic appearance. He does not appear ill. No  distress.  HENT:  Head: Normocephalic and atraumatic.  Right Ear: External ear normal.  Left Ear: External ear normal.  Nose: Nose normal. No mucosal edema or rhinorrhea.  Mouth/Throat: Oropharynx is clear and moist and mucous membranes are normal. No dental abscesses or uvula swelling.  Eyes: Conjunctivae and EOM are normal. Pupils are equal, round, and reactive to light.  Neck: Normal range of motion and full passive range of motion without pain. Neck supple.  Cardiovascular: Normal rate, regular rhythm and normal heart sounds.  Exam reveals no gallop and no friction rub.  No murmur heard. Pulmonary/Chest: Effort normal and breath sounds normal. No respiratory distress. He has no wheezes. He has no rhonchi. He has no rales. He exhibits no tenderness and no crepitus.  Abdominal: Soft. Normal appearance and bowel sounds are normal. He exhibits no distension. There is no tenderness. There is no rebound and no guarding.  Fullness in abdomen.   Musculoskeletal: Normal range of motion. He exhibits no edema or tenderness.  Moves all extremities well.   Neurological: He is alert and oriented to person, place, and time. He has normal strength. No cranial nerve deficit.  Skin: Skin is warm, dry and intact. No rash noted. No erythema. No pallor.  Psychiatric: He has a normal mood and affect. His speech is normal and behavior is normal. His mood appears not anxious.  Nursing note and vitals reviewed.   ED Course  Procedures  DIAGNOSTIC STUDIES: Oxygen Saturation is 97% on RA, normal by my interpretation.    COORDINATION OF CARE: 1:03 AM Discussed treatment plan with pt. Discussed unremarkable urinalysis. Will repeat measure post void residual. Pt acknowledges and agrees to plan.   Patient's initial bladder scan was 244 mL. The nurse documents he urinated 220 mL after which she reports his suprapubic pressure sensation had resolved. A postvoid bladder scan shows only 26 mL of urine.  2:19 AM  Discussed bladder scan results. Patient appears to have some intermittent urinary retention. Will prescribe Flomax and referred to urology.    Labs Review Results for orders placed or performed during the hospital encounter of 11/16/14  Urinalysis, Routine w reflex microscopic-may I&O cath if menses (not at [facility])  Result Value Ref Range   Color, Urine YELLOW YELLOW   APPearance CLEAR CLEAR   Specific Gravity, Urine 1.020 1.005 - 1.030   pH 5.5 5.0 - 8.0   Glucose, UA NEGATIVE NEGATIVE mg/dL   Hgb urine dipstick NEGATIVE NEGATIVE   Bilirubin Urine NEGATIVE NEGATIVE   Ketones, ur NEGATIVE NEGATIVE mg/dL   Protein, ur NEGATIVE NEGATIVE mg/dL   Urobilinogen, UA 0.2 0.0 - 1.0 mg/dL   Nitrite NEGATIVE NEGATIVE   Leukocytes, UA NEGATIVE NEGATIVE   Laboratory interpretation all normal    Imaging Review No results found.   EKG Interpretation None      MDM   Final diagnoses:  Urinary retention    Discharge Medication List as of 11/17/2014  2:15 AM    START taking these medications   Details  tamsulosin (FLOMAX) 0.4 MG CAPS capsule Take 1 po QD until you pass the stone., Print        Plan discharge   I personally performed the services described in this documentation, which was scribed in my presence. The recorded information has been reviewed and considered.  Christian Porter, MD, Barbette Or, MD 11/17/14 [phone]

## 2014-11-21 DIAGNOSIS — S46012D Strain of muscle(s) and tendon(s) of the rotator cuff of left shoulder, subsequent encounter: Secondary | ICD-10-CM | POA: Diagnosis not present

## 2014-11-23 DIAGNOSIS — S46012D Strain of muscle(s) and tendon(s) of the rotator cuff of left shoulder, subsequent encounter: Secondary | ICD-10-CM | POA: Diagnosis not present

## 2014-11-27 ENCOUNTER — Ambulatory Visit (INDEPENDENT_AMBULATORY_CARE_PROVIDER_SITE_OTHER): Payer: Medicare Other | Admitting: Neurology

## 2014-11-27 ENCOUNTER — Encounter: Payer: Self-pay | Admitting: Neurology

## 2014-11-27 VITALS — BP 107/67 | HR 83 | Ht 72.0 in | Wt 211.0 lb

## 2014-11-27 DIAGNOSIS — G44059 Short lasting unilateral neuralgiform headache with conjunctival injection and tearing (SUNCT), not intractable: Secondary | ICD-10-CM | POA: Diagnosis not present

## 2014-11-27 NOTE — Progress Notes (Signed)
Reason for visit: SUNCT headache  Christian Ross is an 74 y.o. male  History of present illness:  Christian Ross is a 74 year old right-handed white male with a history of left-sided SUNCT headache. Christian Ross has done very well on Topamax and Lamictal. He was last seen in February 2016. A change from Christian 25 mg tablets of Lamictal to Christian 100 mg tablets resulted in worsening pain, as there was a change in generic manufacturer. Christian Ross has been placed back on Christian 25 mg tablets, he has done extremely well. He has not had any twinges of pain whatsoever. Christian Ross has had left shoulder surgery for rotator cuff tear, he currently is in physical therapy for this. He has been noted to have sleep apnea, he is now on CPAP and doing well with this. Otherwise, he reports no other new medical problems since last seen. His primary doctor checks blood work on a regular basis. He had a chemistry panel and a CBC done in May 2016. Christian Ross has chronic renal sufficiency, he has some mild elevation in blood sugars. Christian Ross returns for an evaluation.  Past Medical History  Diagnosis Date  . Hyperlipidemia   . Vitamin D deficiency   . Hypertension     orthostatic  . Hypertriglyceridemia   . Insulin resistance   . Erectile dysfunction   . Hemorrhoids   . Hx of colonic polyps   . Diverticulitis   . Diverticulosis   . Cancer     skin pre cancer nose  . 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 Jannifer Franklin  . Rotator cuff tear     left  . Arthritis   . History of skin cancer     basal cell removed left arm 09/07/14  . Borderline diabetes     no meds / elevated HgA1C  . Sleep apnea     recently diagnosed / has not started using c-pap yet  . Cough May 2016    new x78mo, denies fever and shortness of breath    Past Surgical History  Procedure Laterality Date  . Knee surgery  1960    right / for [place]  . Colonoscopy w/ biopsies and polypectomy  multiple, 2005, 08, 14  . Nasal  sinus surgery    . Rectal biopsy    . Shoulder open rotator cuff repair Left 09/14/2014    Procedure: LEFT SHOULDER MINI OPEN ROTATOR CUFF REPAIR AND SUBACROMIAL DECOMPRESSION ;  Surgeon: Susa Day, MD;  Location: [facility];  Service: Orthopedics;  Laterality: Left;    Family History  Problem Relation Age of Onset  . Colon cancer Neg Hx     Social history:  reports that he has never smoked. He has never used smokeless tobacco. He reports that he drinks alcohol. He reports that he does not use illicit drugs.    Allergies  Allergen Reactions  . Lisinopril Cough    Causes a cough    Medications:  Prior to Admission medications   Medication Sig Start Date End Date Taking? Authorizing Provider  Ascorbic Acid (VITAMIN C PO) Take 3 g by mouth every morning.    Yes Historical Provider, MD  aspirin 81 MG tablet Take 81 mg by mouth every evening.    Yes Historical Provider, MD  Berberine Chloride POWD Take 800 mg by mouth every morning.    Yes Historical Provider, MD  DIOVAN 160 MG tablet TAKE 1 TABLET DAILY Ross taking differently: TAKE 1  TABLET DAILY IN Christian EVENING. 02/20/14  Yes Thayer Headings, MD  erythromycin ophthalmic ointment Place 1 application into both eyes at bedtime as needed (for eye redness).  10/23/14  Yes Historical Provider, MD  glucosamine-chondroitin 500-400 MG tablet Take 2 tablets by mouth every morning.    Yes Historical Provider, MD  lamoTRIgine (LAMICTAL) 25 MG tablet Take 4 tablets (100 mg total) by mouth 2 (two) times daily. 03/13/14  Yes Kathrynn Ducking, MD  Lecithin 1200 MG CAPS Take 2 capsules by mouth every morning.   Yes Historical Provider, MD  loratadine (CLARITIN) 10 MG tablet Take 10 mg by mouth daily as needed for allergies.   Yes Historical Provider, MD  metoprolol tartrate (LOPRESSOR) 25 MG tablet TAKE 1 TABLET TWICE A DAY 07/27/14  Yes Thayer Headings, MD  mometasone (NASONEX) 50 MCG/ACT nasal spray Place 2 sprays into Christian nose daily as needed  (allergies).   Yes Historical Provider, MD  omega-3 acid ethyl esters (LOVAZA) 1 G capsule Take 3 g by mouth daily.    Yes Historical Provider, MD  Ophthalmic Irrigation Solution (OCUSOFT EYE [place]) SOLN Apply 1 application to eye as needed (dry eyes).   Yes Historical Provider, MD  OVER Christian COUNTER MEDICATION Take 2 tablets by mouth every morning. [place]   Yes Historical Provider, MD  oxyCODONE-acetaminophen (PERCOCET) 5-325 MG per tablet Take 1 tablet by mouth every 4 (four) hours as needed. 09/14/14  Yes Susa Day, MD  rosuvastatin (CRESTOR) 10 MG tablet Take 1 tablet (10 mg total) by mouth daily. Ross taking differently: Take 10 mg by mouth at bedtime.  07/25/14  Yes Thayer Headings, MD  tamsulosin (FLOMAX) 0.4 MG CAPS capsule Take 1 po QD until you pass Christian stone. 11/17/14  Yes Rolland Porter, MD  topiramate (TOPAMAX) 25 MG tablet 6 tablets twice a day Ross taking differently: Take 150 mg by mouth 2 (two) times daily. 6 tablets twice a day 02/14/14  Yes Kathrynn Ducking, MD  TRICOR 145 MG tablet TAKE 1 TABLET EVERY EVENING 05/18/14  Yes Thayer Headings, MD  Ubiquinol 200 MG CAPS Take 400 mg by mouth every morning.   Yes Historical Provider, MD    ROS:  Out of a complete 14 system review of symptoms, Christian Ross complains only of Christian following symptoms, and all other reviewed systems are negative.  Sleep apnea, snoring Headache  Blood pressure 107/67, pulse 83, height 6' (1.829 m), weight 211 lb (95.709 kg).  Physical Exam  General: Christian Ross is alert and cooperative at Christian time of Christian examination.  Skin: No significant peripheral edema is noted.   Neurologic Exam  Mental status: Christian Ross is alert and oriented x 3 at Christian time of Christian examination. Christian Ross has apparent normal recent and remote memory, with an apparently normal attention span and concentration ability.   Cranial nerves: Facial symmetry is present. Speech is normal, no aphasia or dysarthria is noted.  Extraocular movements are full. Visual fields are full.  Motor: Christian Ross has good strength in all 4 extremities.  Sensory examination: Soft touch sensation is symmetric on Christian face, arms, and legs.  Coordination: Christian Ross has good finger-nose-finger and heel-to-shin bilaterally.  Gait and station: Christian Ross has a normal gait. Tandem gait is normal. Romberg is negative. No drift is seen.  Reflexes: Deep tendon reflexes are symmetric.   Assessment/Plan:  1. SUNCT headache, left-sided  Christian Ross has done quite well with his headache syndrome. He  is on a combination of Topamax and Lamictal, he is tolerating Christian medications well, we will continue Christian drug regimen and have Christian Ross follow-up in 9 months, sooner if needed. He is very sensitive to changes in generic manufacturer for Christian medication.  Jill Alexanders MD 11/27/2014 8:17 AM  [facility] [address] [place], [place] [address]  Phone [phone] Fax [phone]

## 2014-11-27 NOTE — Patient Instructions (Signed)

## 2014-11-28 DIAGNOSIS — R339 Retention of urine, unspecified: Secondary | ICD-10-CM | POA: Diagnosis not present

## 2014-11-28 DIAGNOSIS — R351 Nocturia: Secondary | ICD-10-CM | POA: Diagnosis not present

## 2014-11-28 DIAGNOSIS — S46012D Strain of muscle(s) and tendon(s) of the rotator cuff of left shoulder, subsequent encounter: Secondary | ICD-10-CM | POA: Diagnosis not present

## 2014-11-30 DIAGNOSIS — S46012D Strain of muscle(s) and tendon(s) of the rotator cuff of left shoulder, subsequent encounter: Secondary | ICD-10-CM | POA: Diagnosis not present

## 2014-12-05 ENCOUNTER — Other Ambulatory Visit: Payer: Self-pay

## 2014-12-05 DIAGNOSIS — S46012D Strain of muscle(s) and tendon(s) of the rotator cuff of left shoulder, subsequent encounter: Secondary | ICD-10-CM | POA: Diagnosis not present

## 2014-12-05 MED ORDER — ROSUVASTATIN CALCIUM 10 MG PO TABS: 10.0000 mg | ORAL_TABLET | Freq: Every day | ORAL | Status: DC

## 2014-12-07 DIAGNOSIS — S46012D Strain of muscle(s) and tendon(s) of the rotator cuff of left shoulder, subsequent encounter: Secondary | ICD-10-CM | POA: Diagnosis not present

## 2014-12-11 DIAGNOSIS — R1032 Left lower quadrant pain: Secondary | ICD-10-CM | POA: Diagnosis not present

## 2014-12-11 DIAGNOSIS — Z4789 Encounter for other orthopedic aftercare: Secondary | ICD-10-CM | POA: Diagnosis not present

## 2014-12-14 DIAGNOSIS — W19XXXA Unspecified fall, initial encounter: Secondary | ICD-10-CM | POA: Diagnosis not present

## 2014-12-14 DIAGNOSIS — S46012D Strain of muscle(s) and tendon(s) of the rotator cuff of left shoulder, subsequent encounter: Secondary | ICD-10-CM | POA: Diagnosis not present

## 2014-12-14 DIAGNOSIS — M549 Dorsalgia, unspecified: Secondary | ICD-10-CM | POA: Diagnosis not present

## 2014-12-14 DIAGNOSIS — M533 Sacrococcygeal disorders, not elsewhere classified: Secondary | ICD-10-CM | POA: Diagnosis not present

## 2014-12-19 DIAGNOSIS — S46012D Strain of muscle(s) and tendon(s) of the rotator cuff of left shoulder, subsequent encounter: Secondary | ICD-10-CM | POA: Diagnosis not present

## 2014-12-21 DIAGNOSIS — S46012D Strain of muscle(s) and tendon(s) of the rotator cuff of left shoulder, subsequent encounter: Secondary | ICD-10-CM | POA: Diagnosis not present

## 2014-12-26 DIAGNOSIS — S46012D Strain of muscle(s) and tendon(s) of the rotator cuff of left shoulder, subsequent encounter: Secondary | ICD-10-CM | POA: Diagnosis not present

## 2015-01-02 DIAGNOSIS — S46012D Strain of muscle(s) and tendon(s) of the rotator cuff of left shoulder, subsequent encounter: Secondary | ICD-10-CM | POA: Diagnosis not present

## 2015-01-04 DIAGNOSIS — S46012D Strain of muscle(s) and tendon(s) of the rotator cuff of left shoulder, subsequent encounter: Secondary | ICD-10-CM | POA: Diagnosis not present

## 2015-01-09 DIAGNOSIS — S46012D Strain of muscle(s) and tendon(s) of the rotator cuff of left shoulder, subsequent encounter: Secondary | ICD-10-CM | POA: Diagnosis not present

## 2015-01-09 DIAGNOSIS — D539 Nutritional anemia, unspecified: Secondary | ICD-10-CM | POA: Diagnosis not present

## 2015-01-09 DIAGNOSIS — D649 Anemia, unspecified: Secondary | ICD-10-CM | POA: Diagnosis not present

## 2015-01-11 DIAGNOSIS — S46012D Strain of muscle(s) and tendon(s) of the rotator cuff of left shoulder, subsequent encounter: Secondary | ICD-10-CM | POA: Diagnosis not present

## 2015-01-15 ENCOUNTER — Encounter: Payer: Self-pay | Admitting: Cardiovascular Disease

## 2015-01-15 ENCOUNTER — Ambulatory Visit (INDEPENDENT_AMBULATORY_CARE_PROVIDER_SITE_OTHER): Payer: Medicare Other | Admitting: Cardiovascular Disease

## 2015-01-15 VITALS — BP 110/80 | HR 58 | Ht 72.0 in | Wt 213.4 lb

## 2015-01-15 DIAGNOSIS — E785 Hyperlipidemia, unspecified: Secondary | ICD-10-CM | POA: Diagnosis not present

## 2015-01-15 DIAGNOSIS — I10 Essential (primary) hypertension: Secondary | ICD-10-CM | POA: Diagnosis not present

## 2015-01-15 LAB — BASIC METABOLIC PANEL
BUN: 31 mg/dL — ABNORMAL HIGH (ref 6–23)
CHLORIDE: 107 meq/L (ref 96–112)
CO2: 20 mEq/L (ref 19–32)
Calcium: 9.8 mg/dL (ref 8.4–10.5)
Creatinine, Ser: 1.42 mg/dL (ref 0.40–1.50)
GFR: 52.03 mL/min — AB (ref >60.00)
Glucose, Bld: 108 mg/dL — ABNORMAL HIGH (ref 70–99)
POTASSIUM: 4.5 meq/L (ref 3.5–5.1)
SODIUM: 136 meq/L (ref 135–145)

## 2015-01-15 LAB — HEPATIC FUNCTION PANEL
ALK PHOS: 37 U/L — AB (ref 39–117)
ALT: 32 U/L (ref 0–53)
AST: 29 U/L (ref 0–37)
Albumin: 4.5 g/dL (ref 3.5–5.2)
BILIRUBIN TOTAL: 0.3 mg/dL (ref 0.2–1.2)
Bilirubin, Direct: 0.1 mg/dL (ref 0.0–0.3)
Total Protein: 7.8 g/dL (ref 6.0–8.3)

## 2015-01-15 LAB — LIPID PANEL
CHOL/HDL RATIO: 4
Cholesterol: 142 mg/dL (ref 0–200)
HDL: 34.7 mg/dL — ABNORMAL LOW (ref >39.00)
NonHDL: 107.31
Triglycerides: 204 mg/dL — ABNORMAL HIGH (ref 0.0–149.0)
VLDL: 40.8 mg/dL — AB (ref 0.0–40.0)

## 2015-01-15 LAB — LDL CHOLESTEROL, DIRECT: Direct LDL: 98 mg/dL

## 2015-01-15 NOTE — Patient Instructions (Addendum)

## 2015-01-15 NOTE — Progress Notes (Signed)
Christian Ross Date of Birth  Dec 31, 1940 [place]  [address]    [place], [place]  [address]    [place], [place]  [address] [phone]  Fax  [phone]  [phone]  Fax [phone]  1. Hyperlipidemia 2.Hypertension  History of Present Illness:  Christian Ross seems to be doing well.  He continues to have elevations in his triglyceride levels.  He is tolerating the crestor now.    Sept. 15, 2014:   Still active flying, not exercising as much as he should.  No CP or dyspnea.  Sept. 24, 2015:  Christian Ross is doing well.  Has lost 10 lbs since  I last saw him.  Exercising more.  HbA1C  Has improved.   He is no longer flying because he is on Lamactil.    Sept. 26, 2016:     Doing well from a cardiac standpoint On Flomax for BPH.  No cardiac issues.     Current Outpatient Prescriptions on File Prior to Visit  Medication Sig Dispense Refill  . Ascorbic Acid (VITAMIN C PO) Take 3 g by mouth every morning.     Marland Kitchen aspirin 81 MG tablet Take 81 mg by mouth every evening.     . Berberine Chloride POWD Take 800 mg by mouth every morning.     Marland Kitchen DIOVAN 160 MG tablet TAKE 1 TABLET DAILY (Patient taking differently: TAKE 1 TABLET DAILY IN THE EVENING.) 90 tablet 3  . erythromycin ophthalmic ointment Place 1 application into both eyes at bedtime as needed (for eye redness).   2  . glucosamine-chondroitin 500-400 MG tablet Take 2 tablets by mouth every morning.     . lamoTRIgine (LAMICTAL) 25 MG tablet Take 4 tablets (100 mg total) by mouth 2 (two) times daily. 720 tablet 3  . Lecithin 1200 MG CAPS Take 2 capsules by mouth every morning.    . loratadine (CLARITIN) 10 MG tablet Take 10 mg by mouth daily as needed for allergies.    . metoprolol tartrate (LOPRESSOR) 25 MG tablet TAKE 1 TABLET TWICE A DAY 180 tablet 1  . mometasone (NASONEX) 50 MCG/ACT nasal spray Place 2 sprays into the nose daily as needed (allergies).    Marland Kitchen omega-3  acid ethyl esters (LOVAZA) 1 G capsule Take 3 g by mouth daily.     Marland Kitchen Ophthalmic Irrigation Solution (OCUSOFT EYE [place]) SOLN Apply 1 application to eye as needed (dry eyes).    Marland Kitchen OVER THE COUNTER MEDICATION Take 2 tablets by mouth every morning. [place]    . oxyCODONE-acetaminophen (PERCOCET) 5-325 MG per tablet Take 1 tablet by mouth every 4 (four) hours as needed. 60 tablet 0  . rosuvastatin (CRESTOR) 10 MG tablet Take 1 tablet (10 mg total) by mouth at bedtime. 90 tablet 3  . tamsulosin (FLOMAX) 0.4 MG CAPS capsule Take 1 po QD until you pass the stone. 30 capsule 0  . topiramate (TOPAMAX) 25 MG tablet 6 tablets twice a day (Patient taking differently: Take 150 mg by mouth 2 (two) times daily. 6 tablets twice a day) 1080 tablet 3  . TRICOR 145 MG tablet TAKE 1 TABLET EVERY EVENING 90 tablet 2  . Ubiquinol 200 MG CAPS Take 400 mg by mouth every morning.    . [DISCONTINUED] irbesartan (AVAPRO) 150 MG tablet Take 1 tablet (150 mg total) by mouth daily. 90 tablet 3   No current facility-administered medications on  file prior to visit.    Allergies  Allergen Reactions  . Lisinopril Cough    Causes a cough    Past Medical History  Diagnosis Date  . Hyperlipidemia   . Vitamin D deficiency   . Hypertension     orthostatic  . Hypertriglyceridemia   . Insulin resistance   . Erectile dysfunction   . Hemorrhoids   . Hx of colonic polyps   . Diverticulitis   . Diverticulosis   . Cancer     skin pre cancer nose  . 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 Jannifer Franklin  . Rotator cuff tear     left  . Arthritis   . History of skin cancer     basal cell removed left arm 09/07/14  . Borderline diabetes     no meds / elevated HgA1C  . Sleep apnea     recently diagnosed / has not started using c-pap yet  . Cough May 2016    new x33mo, denies fever and shortness of breath    Past Surgical History  Procedure Laterality Date  . Knee surgery  1960    right / for [place]  .  Colonoscopy w/ biopsies and polypectomy  multiple, 2005, 08, 14  . Nasal sinus surgery    . Rectal biopsy    . Shoulder open rotator cuff repair Left 09/14/2014    Procedure: LEFT SHOULDER MINI OPEN ROTATOR CUFF REPAIR AND SUBACROMIAL DECOMPRESSION ;  Surgeon: Susa Day, MD;  Location: [facility];  Service: Orthopedics;  Laterality: Left;    History  Smoking status  . Never Smoker   Smokeless tobacco  . Never Used    History  Alcohol Use  . Yes    Comment: rarely    Family History  Problem Relation Age of Onset  . Colon cancer Neg Hx     Reviw of Systems:  Reviewed in the HPI.  All other systems are negative.  Physical Exam: Blood pressure 110/80, pulse 58, height 6' (1.829 m), weight 96.798 kg (213 lb 6.4 oz). General: Well developed, well nourished, in no acute distress.  Head: Normocephalic, atraumatic, sclera non-icteric, mucus membranes are moist,   Neck: Supple. Negative for carotid bruits. JVD not elevated.  Lungs: Clear bilaterally to auscultation without wheezes, rales, or rhonchi. Breathing is unlabored.  Heart: RRR with S1 S2. No murmurs, rubs, or gallops appreciated.  Abdomen: Soft, non-tender, non-distended with normoactive bowel sounds. No hepatomegaly. No rebound/guarding. No obvious abdominal masses.  Msk:  Strength and tone appear normal for age.  Extremities: No clubbing or cyanosis. No edema.  Distal pedal pulses are 2+ and equal bilaterally.  Neuro: Alert and oriented X 3. Moves all extremities spontaneously.  Psych:  Responds to questions appropriately with a normal affect.  ECG:   Assessment / Plan:   1. Hyperlipidemia-      Continue current dose of Crestor 10 mg a day.  Check labs today   2.Hypertension- well controlled. Continue  same meds      Nahser, Wonda Cheng, MD  01/15/2015 11:54 AM    [place],  [place], [place]  [address] Pager [phone] Phone: [phone]; Fax: [phone]   [facility]  [address] [place], [place]  [address] [phone]   Fax [phone]

## 2015-01-16 ENCOUNTER — Other Ambulatory Visit: Payer: Self-pay | Admitting: Cardiovascular Disease

## 2015-01-23 DIAGNOSIS — E785 Hyperlipidemia, unspecified: Secondary | ICD-10-CM | POA: Diagnosis not present

## 2015-01-23 DIAGNOSIS — I10 Essential (primary) hypertension: Secondary | ICD-10-CM | POA: Diagnosis not present

## 2015-01-23 DIAGNOSIS — E119 Type 2 diabetes mellitus without complications: Secondary | ICD-10-CM | POA: Diagnosis not present

## 2015-01-23 DIAGNOSIS — N183 Chronic kidney disease, stage 3 (moderate): Secondary | ICD-10-CM | POA: Diagnosis not present

## 2015-01-23 DIAGNOSIS — R946 Abnormal results of thyroid function studies: Secondary | ICD-10-CM | POA: Diagnosis not present

## 2015-01-23 DIAGNOSIS — Z0000 Encounter for general adult medical examination without abnormal findings: Secondary | ICD-10-CM | POA: Diagnosis not present

## 2015-01-23 DIAGNOSIS — D649 Anemia, unspecified: Secondary | ICD-10-CM | POA: Diagnosis not present

## 2015-01-24 ENCOUNTER — Other Ambulatory Visit: Payer: Self-pay | Admitting: Cardiovascular Disease

## 2015-01-29 ENCOUNTER — Other Ambulatory Visit: Payer: Self-pay | Admitting: Neurology

## 2015-02-06 ENCOUNTER — Telehealth: Payer: Self-pay | Admitting: Cardiovascular Disease

## 2015-02-06 ENCOUNTER — Other Ambulatory Visit: Payer: Self-pay | Admitting: Cardiovascular Disease

## 2015-02-06 NOTE — Telephone Encounter (Signed)
Error.  Pt had a script that needed to be called in. He will call the pharmacy first.

## 2015-02-22 DIAGNOSIS — L570 Actinic keratosis: Secondary | ICD-10-CM | POA: Diagnosis not present

## 2015-02-22 DIAGNOSIS — L821 Other seborrheic keratosis: Secondary | ICD-10-CM | POA: Diagnosis not present

## 2015-02-22 DIAGNOSIS — Z86018 Personal history of other benign neoplasm: Secondary | ICD-10-CM | POA: Diagnosis not present

## 2015-02-22 DIAGNOSIS — D225 Melanocytic nevi of trunk: Secondary | ICD-10-CM | POA: Diagnosis not present

## 2015-02-22 DIAGNOSIS — Z85828 Personal history of other malignant neoplasm of skin: Secondary | ICD-10-CM | POA: Diagnosis not present

## 2015-02-22 DIAGNOSIS — L309 Dermatitis, unspecified: Secondary | ICD-10-CM | POA: Diagnosis not present

## 2015-03-19 ENCOUNTER — Other Ambulatory Visit: Payer: Self-pay | Admitting: Cardiovascular Disease

## 2015-05-09 DIAGNOSIS — H524 Presbyopia: Secondary | ICD-10-CM | POA: Diagnosis not present

## 2015-05-09 DIAGNOSIS — H43812 Vitreous degeneration, left eye: Secondary | ICD-10-CM | POA: Diagnosis not present

## 2015-05-09 DIAGNOSIS — H2513 Age-related nuclear cataract, bilateral: Secondary | ICD-10-CM | POA: Diagnosis not present

## 2015-06-06 IMAGING — DX DG SHOULDER 1V*L*
1 series · 1 of 1 positions shown · non-contrast
Comparison: None.

CLINICAL DATA: Status post left mini rotator cuff repair

EXAM:
LEFT SHOULDER - 1 VIEW

[shoulder ap]
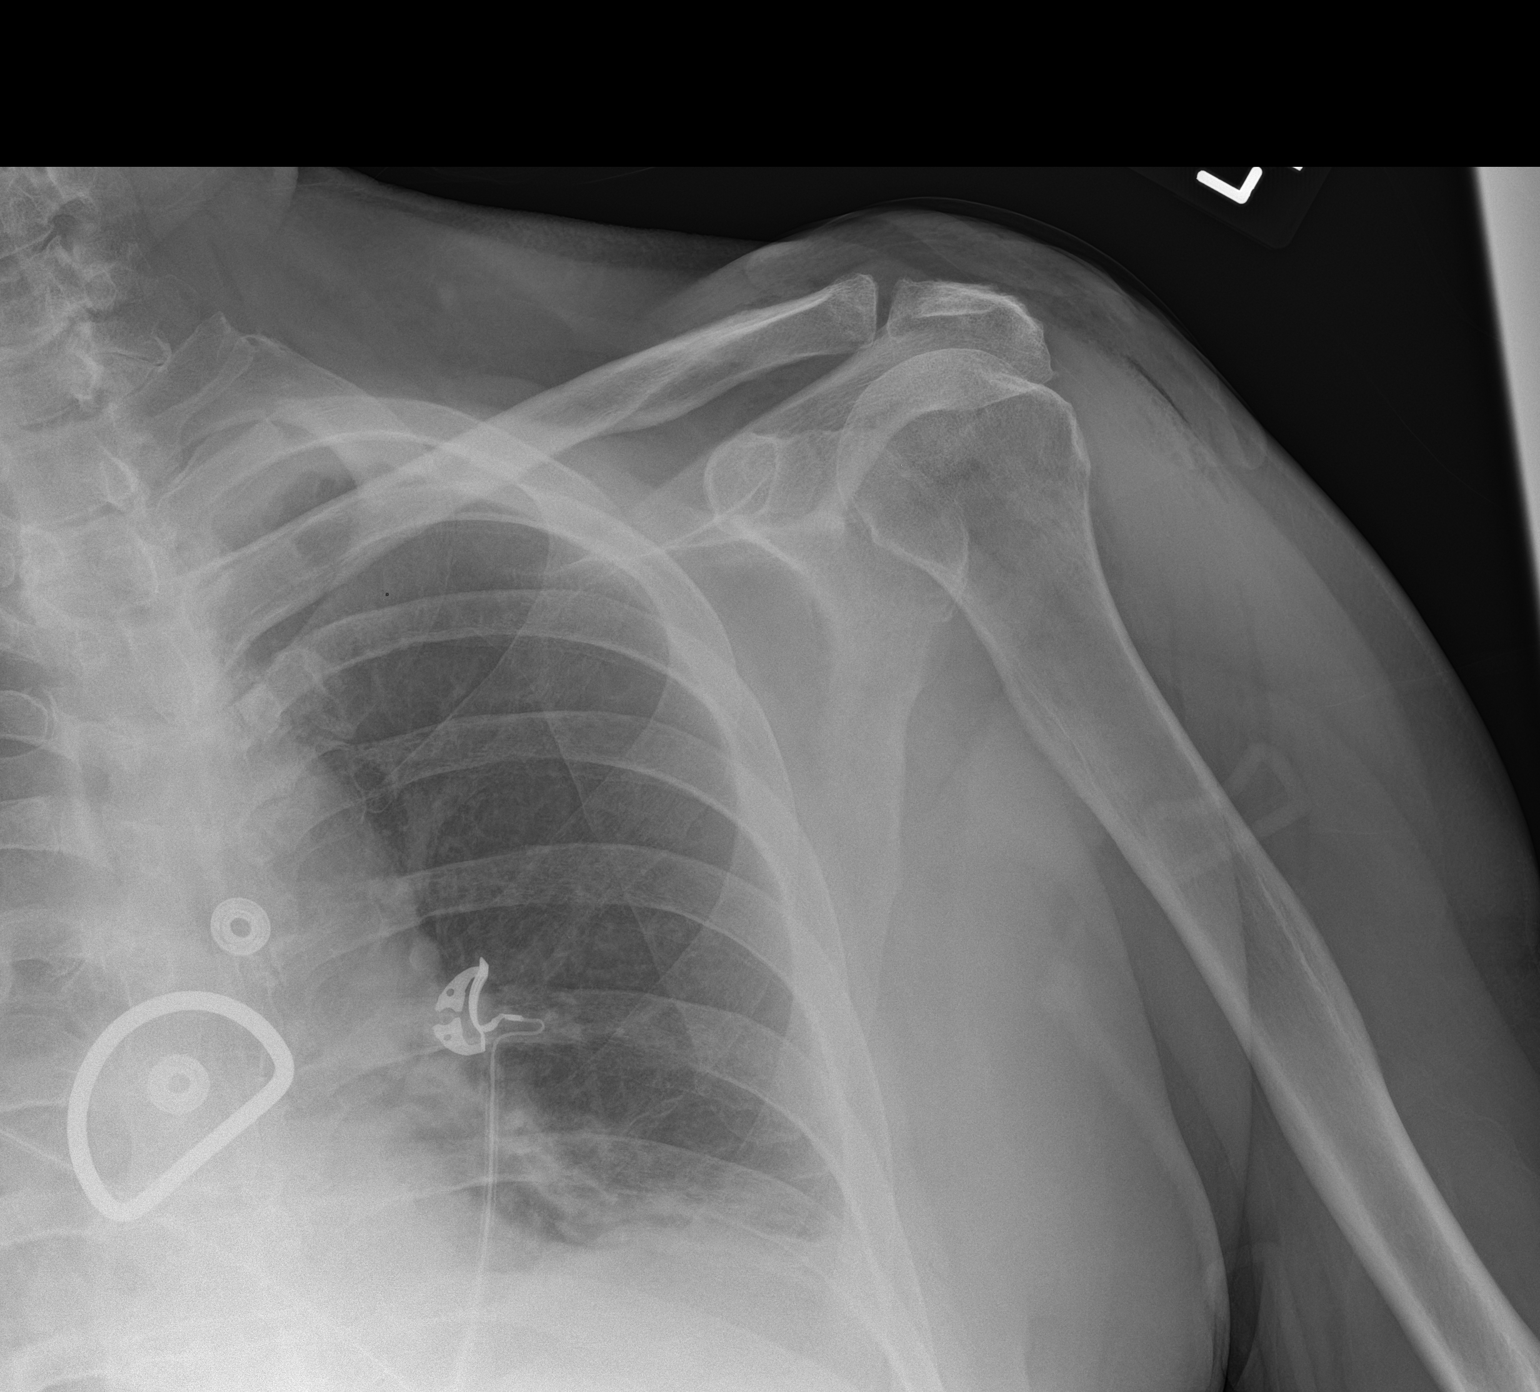

[1 of 1 positions shown; findings below may reference images not displayed]

FINDINGS: Air is noted along the course of the deltoid consistent with the
recent surgery. No acute fracture or dislocation is noted. Mild left
basilar atelectasis is noted
IMPRESSION: Postsurgical changes.

Mild left basilar atelectasis.

## 2015-07-14 ENCOUNTER — Other Ambulatory Visit: Payer: Self-pay | Admitting: Neurology

## 2015-08-08 DIAGNOSIS — E785 Hyperlipidemia, unspecified: Secondary | ICD-10-CM | POA: Diagnosis not present

## 2015-08-08 DIAGNOSIS — E119 Type 2 diabetes mellitus without complications: Secondary | ICD-10-CM | POA: Diagnosis not present

## 2015-08-08 DIAGNOSIS — N183 Chronic kidney disease, stage 3 (moderate): Secondary | ICD-10-CM | POA: Diagnosis not present

## 2015-08-08 DIAGNOSIS — R946 Abnormal results of thyroid function studies: Secondary | ICD-10-CM | POA: Diagnosis not present

## 2015-08-15 DIAGNOSIS — E119 Type 2 diabetes mellitus without complications: Secondary | ICD-10-CM | POA: Diagnosis not present

## 2015-08-15 DIAGNOSIS — E785 Hyperlipidemia, unspecified: Secondary | ICD-10-CM | POA: Diagnosis not present

## 2015-08-15 DIAGNOSIS — N183 Chronic kidney disease, stage 3 (moderate): Secondary | ICD-10-CM | POA: Diagnosis not present

## 2015-08-15 DIAGNOSIS — I10 Essential (primary) hypertension: Secondary | ICD-10-CM | POA: Diagnosis not present

## 2015-08-30 ENCOUNTER — Encounter: Payer: Self-pay | Admitting: Neurology

## 2015-08-30 ENCOUNTER — Ambulatory Visit (INDEPENDENT_AMBULATORY_CARE_PROVIDER_SITE_OTHER): Payer: Medicare Other | Admitting: Neurology

## 2015-08-30 VITALS — BP 118/64 | HR 72 | Ht 72.0 in | Wt 215.0 lb

## 2015-08-30 DIAGNOSIS — G44059 Short lasting unilateral neuralgiform headache with conjunctival injection and tearing (SUNCT), not intractable: Secondary | ICD-10-CM

## 2015-08-30 NOTE — Patient Instructions (Signed)
   We will taper off of the Lamictal (lamotrigine) 25 mg tablet by 1 tablet every 2 weeks until off of the medication. Stay on the Topamax. If the headache recurs, please call.

## 2015-08-30 NOTE — Progress Notes (Signed)
Reason for visit: SUNCT headache  Christian Ross is an 75 y.o. 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 The patient has done very well over the last 3 years on a combination of Topamax and Lamictal. The patient has had no twinges of pain whatsoever. He is tolerating the medication well, he denies any side effects. He denies any new medical issues that have come up recently. He returns for an evaluation today.  Past Medical History  Diagnosis Date  . Hyperlipidemia   . Vitamin D deficiency   . Hypertension     orthostatic  . Hypertriglyceridemia   . Insulin resistance   . Erectile dysfunction   . Hemorrhoids   . Hx of colonic polyps   . Diverticulitis   . Diverticulosis   . Cancer ([place])     skin pre cancer nose  . 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 Jannifer Franklin  . Rotator cuff tear     left  . Arthritis   . History of skin cancer     basal cell removed left arm 09/07/14  . Borderline diabetes     no meds / elevated HgA1C  . Sleep apnea     recently diagnosed / has not started using c-pap yet  . Cough May 2016    new x42mo, denies fever and shortness of breath    Past Surgical History  Procedure Laterality Date  . Knee surgery  1960    right / for [place]  . Colonoscopy w/ biopsies and polypectomy  multiple, 2005, 08, 14  . Nasal sinus surgery    . Rectal biopsy    . Shoulder open rotator cuff repair Left 09/14/2014    Procedure: LEFT SHOULDER MINI OPEN ROTATOR CUFF REPAIR AND SUBACROMIAL DECOMPRESSION ;  Surgeon: Susa Day, MD;  Location: [facility];  Service: Orthopedics;  Laterality: Left;    Family History  Problem Relation Age of Onset  . Colon cancer Neg Hx     Social history:  reports that he has never smoked. He has never used smokeless tobacco. He reports that he drinks alcohol. He reports that he does not use illicit drugs.    Allergies    Allergen Reactions  . Lisinopril Cough    Causes a cough    Medications:  Prior to Admission medications   Medication Sig Start Date End Date Taking? Authorizing Provider  Ascorbic Acid (VITAMIN C PO) Take 3 g by mouth every morning.    Yes Historical Provider, MD  aspirin 81 MG tablet Take 81 mg by mouth every evening.    Yes Historical Provider, MD  Berberine Chloride POWD Take 800 mg by mouth every morning.    Yes Historical Provider, MD  erythromycin ophthalmic ointment Place 1 application into both eyes at bedtime as needed (for eye redness).  10/23/14  Yes Historical Provider, MD  fenofibrate (TRICOR) 145 MG tablet Take 1 tablet (145 mg total) by mouth every evening. 03/20/15  Yes Thayer Headings, MD  Glucosamine-Chondroit-Vit C-Mn (GLUCOSAMINE CHONDR 1500 COMPLX PO) Take 1 tablet by mouth 2 (two) times daily.   Yes Historical Provider, MD  lamoTRIgine (LAMICTAL) 25 MG tablet Take 4 tablets (100 mg total) by mouth 2 (two) times daily. Pt prefers [place].Has adverse effects on [facility] Manufacture 01/29/15  Yes Kathrynn Ducking, MD  Lecithin 1200 MG CAPS Take 2  capsules by mouth every morning.   Yes Historical Provider, MD  loratadine (CLARITIN) 10 MG tablet Take 10 mg by mouth daily as needed for allergies.   Yes Historical Provider, MD  metoprolol tartrate (LOPRESSOR) 25 MG tablet Take 1 tablet (25 mg total) by mouth 2 (two) times daily. 01/24/15  Yes Thayer Headings, MD  mometasone (NASONEX) 50 MCG/ACT nasal spray Place 2 sprays into the nose daily as needed (allergies).   Yes Historical Provider, MD  omega-3 acid ethyl esters (LOVAZA) 1 G capsule Take 3 g by mouth daily.    Yes Historical Provider, MD  Ophthalmic Irrigation Solution (OCUSOFT EYE [place]) SOLN Apply 1 application to eye as needed (dry eyes).   Yes Historical Provider, MD  OVER THE COUNTER MEDICATION Take 2 tablets by mouth every morning. [place]   Yes Historical Provider, MD  oxyCODONE-acetaminophen  (PERCOCET) 5-325 MG per tablet Take 1 tablet by mouth every 4 (four) hours as needed. 09/14/14  Yes Susa Day, MD  rosuvastatin (CRESTOR) 10 MG tablet Take 1 tablet (10 mg total) by mouth at bedtime. 12/05/14  Yes Thayer Headings, MD  tamsulosin (FLOMAX) 0.4 MG CAPS capsule Take 1 po QD until you pass the stone. 11/17/14  Yes Rolland Porter, MD  topiramate (TOPAMAX) 25 MG tablet TAKE 6 TABLETS TWICE A DAY 07/18/15  Yes Kathrynn Ducking, MD  Ubiquinol 200 MG CAPS Take 400 mg by mouth every morning.   Yes Historical Provider, MD  valsartan (DIOVAN) 160 MG tablet Take 1 tablet (160 mg total) by mouth daily. 01/16/15  Yes Thayer Headings, MD    ROS:  Out of a complete 14 system review of symptoms, the patient complains only of the following symptoms, and all other reviewed systems are negative.  Sleep apnea on CPAP  Blood pressure 118/64, pulse 72, height 6' (1.829 m), weight 215 lb (97.523 kg).  Physical Exam  General: The patient is alert and cooperative at the time of the examination.  Skin: No significant peripheral edema is noted.   Neurologic Exam  Mental status: The patient is alert and oriented x 3 at the time of the examination. The patient has apparent normal recent and remote memory, with an apparently normal attention span and concentration ability.   Cranial nerves: Facial symmetry is present. Speech is normal, no aphasia or dysarthria is noted. Extraocular movements are full. Visual fields are full.  Motor: The patient has good strength in all 4 extremities.  Sensory examination: Soft touch sensation is symmetric on the face, arms, and legs.  Coordination: The patient has good finger-nose-finger and heel-to-shin bilaterally.  Gait and station: The patient has a normal gait. Tandem gait is normal. Romberg is negative. No drift is seen.  Reflexes: Deep tendon reflexes are symmetric.   Assessment/Plan:  1. SUNCT headache  The patient has had resolution of his headaches  with the use of Lamictal and Topamax. At this point, he has not had any pain whatsoever for about 3 years. We will try a slow taper off of the Lamictal going down by 25 mg every 2 weeks until he is off the medication. He will remain on the Topamax for now. He will follow-up in 6 months. He will call me if any of the pain returns.  Jill Alexanders MD 08/30/2015 8:29 PM  [facility] [address] [place], [place] [address]  Phone [phone] Fax [phone]

## 2015-10-24 DIAGNOSIS — G4733 Obstructive sleep apnea (adult) (pediatric): Secondary | ICD-10-CM | POA: Diagnosis not present

## 2015-10-26 ENCOUNTER — Other Ambulatory Visit: Payer: Self-pay | Admitting: Family Medicine

## 2015-10-26 ENCOUNTER — Ambulatory Visit
Admission: RE | Admit: 2015-10-26 | Discharge: 2015-10-26 | Disposition: A | Discharge status: Home / Self Care | Payer: Medicare Other | Source: Ambulatory Visit | Attending: Family Medicine | Admitting: Family Medicine

## 2015-10-26 DIAGNOSIS — M25572 Pain in left ankle and joints of left foot: Secondary | ICD-10-CM

## 2015-10-26 DIAGNOSIS — M7989 Other specified soft tissue disorders: Secondary | ICD-10-CM | POA: Diagnosis not present

## 2015-11-06 DIAGNOSIS — M19072 Primary osteoarthritis, left ankle and foot: Secondary | ICD-10-CM | POA: Diagnosis not present

## 2015-11-06 DIAGNOSIS — S99822A Other specified injuries of left foot, initial encounter: Secondary | ICD-10-CM | POA: Diagnosis not present

## 2015-11-06 DIAGNOSIS — M79672 Pain in left foot: Secondary | ICD-10-CM | POA: Diagnosis not present

## 2015-11-06 DIAGNOSIS — S93602A Unspecified sprain of left foot, initial encounter: Secondary | ICD-10-CM | POA: Diagnosis not present

## 2015-11-07 DIAGNOSIS — H2513 Age-related nuclear cataract, bilateral: Secondary | ICD-10-CM | POA: Diagnosis not present

## 2015-11-07 DIAGNOSIS — H43812 Vitreous degeneration, left eye: Secondary | ICD-10-CM | POA: Diagnosis not present

## 2015-11-27 DIAGNOSIS — S93602D Unspecified sprain of left foot, subsequent encounter: Secondary | ICD-10-CM | POA: Diagnosis not present

## 2015-11-27 DIAGNOSIS — M79672 Pain in left foot: Secondary | ICD-10-CM | POA: Diagnosis not present

## 2015-11-27 DIAGNOSIS — M19072 Primary osteoarthritis, left ankle and foot: Secondary | ICD-10-CM | POA: Diagnosis not present

## 2015-12-25 ENCOUNTER — Encounter: Payer: Self-pay | Admitting: Cardiovascular Disease

## 2016-01-08 ENCOUNTER — Encounter: Payer: Self-pay | Admitting: Cardiovascular Disease

## 2016-01-08 ENCOUNTER — Ambulatory Visit (INDEPENDENT_AMBULATORY_CARE_PROVIDER_SITE_OTHER): Payer: Medicare Other | Admitting: Cardiovascular Disease

## 2016-01-08 VITALS — BP 108/72 | HR 66 | Ht 72.0 in | Wt 219.8 lb

## 2016-01-08 DIAGNOSIS — E785 Hyperlipidemia, unspecified: Secondary | ICD-10-CM

## 2016-01-08 DIAGNOSIS — I10 Essential (primary) hypertension: Secondary | ICD-10-CM

## 2016-01-08 NOTE — Patient Instructions (Signed)
Medication Instructions:  Your physician recommends that you continue on your current medications as directed. Please refer to the Current Medication list given to you today.   Labwork: None Ordered   Testing/Procedures: None Ordered   Follow-Up: Your physician wants you to follow-up in: 1 year with Dr. Nahser.  You will receive a reminder letter in the mail two months in advance. If you don't receive a letter, please call our office to schedule the follow-up appointment.   If you need a refill on your cardiac medications before your next appointment, please call your pharmacy.   Thank you for choosing [facility]! Keydi Giel, RN [phone]    

## 2016-01-08 NOTE — Progress Notes (Signed)
Christian Ross Date of Birth  [birth year] [place]  [id number]    [place]   [place], [place]  [address]    [place], [place]  [address] [phone]  Fax  [phone]  [phone]  Fax [phone]  1. Hyperlipidemia 2.Hypertension  History of Present Illness:  Christian Ross seems to be doing well.  He continues to have elevations in his triglyceride levels.  He is tolerating the crestor now.    Sept. 15, 2014:   Still active flying, not exercising as much as he should.  No CP or dyspnea.  Sept. 24, 2015:  Christian Ross is doing well.  Has lost 10 lbs since  I last saw him.  Exercising more.  HbA1C  Has improved.   He is no longer flying because he is on Lamactil.    Sept. 26, 2016:     Doing well from a cardiac standpoint On Flomax for BPH.  No cardiac issues.   Sept. 19, 2017  Doing well from a cardiac standpoint. Had some foot issues.   Was in a boot for several months  Now has orthotic arch supports in his shoes.  Glucose has been slightly elevated.    Current Outpatient Prescriptions on File Prior to Visit  Medication Sig Dispense Refill  . Ascorbic Acid (VITAMIN C PO) Take 3 g by mouth every morning.     Marland Kitchen aspirin 81 MG tablet Take 81 mg by mouth every evening.     . Berberine Chloride POWD Take 800 mg by mouth every morning.     Marland Kitchen erythromycin ophthalmic ointment Place 1 application into both eyes at bedtime as needed (for eye redness).   2  . fenofibrate (TRICOR) 145 MG tablet Take 1 tablet (145 mg total) by mouth every evening. 90 tablet 3  . Glucosamine-Chondroit-Vit C-Mn (GLUCOSAMINE CHONDR 1500 COMPLX PO) Take 1 tablet by mouth 2 (two) times daily.    Marland Kitchen lamoTRIgine (LAMICTAL) 25 MG tablet Take 4 tablets (100 mg total) by mouth 2 (two) times daily. Pt prefers Lawyer.Has adverse effects on [employer] Manufacture (Patient taking differently: Take 100 mg by mouth daily. Pt prefers Air traffic controller.Has adverse effects on [employer] Manufacture) 720 tablet 2  . Lecithin 1200 MG CAPS Take 2 capsules by mouth every morning.    . loratadine (CLARITIN) 10 MG tablet Take 10 mg by mouth daily as needed for allergies.    . metoprolol tartrate (LOPRESSOR) 25 MG tablet Take 1 tablet (25 mg total) by mouth 2 (two) times daily. 180 tablet 3  . mometasone (NASONEX) 50 MCG/ACT nasal spray Place 2 sprays into the nose daily as needed (allergies).    Marland Kitchen omega-3 acid ethyl esters (LOVAZA) 1 G capsule Take 3 g by mouth daily.     Marland Kitchen OVER THE COUNTER MEDICATION Take 2 tablets by mouth every morning. [place]    . oxyCODONE-acetaminophen (PERCOCET) 5-325 MG per tablet Take 1 tablet by mouth every 4 (four) hours as needed. 60 tablet 0  . rosuvastatin (CRESTOR) 10 MG tablet Take 1 tablet (10 mg total) by mouth at bedtime. 90 tablet 3  . tamsulosin (FLOMAX) 0.4 MG CAPS capsule Take 1 po QD until you pass the stone. 30 capsule 0  . topiramate (TOPAMAX) 25 MG tablet TAKE 6 TABLETS TWICE A DAY 1080 tablet 2  . Ubiquinol 200 MG CAPS Take 400 mg by mouth every morning.    Marland Kitchen  valsartan (DIOVAN) 160 MG tablet Take 1 tablet (160 mg total) by mouth daily. 90 tablet 3  . [DISCONTINUED] irbesartan (AVAPRO) 150 MG tablet Take 1 tablet (150 mg total) by mouth daily. 90 tablet 3   No current facility-administered medications on file prior to visit.     Allergies  Allergen Reactions  . Lisinopril Cough    Causes a cough    Past Medical History:  Diagnosis Date  . Arthritis   . Borderline diabetes    no meds / elevated HgA1C  . Cancer ([place])    skin pre cancer nose  . Cough May 2016   new x64mo, denies fever and shortness of breath  . Diverticulitis   . Diverticulosis   . Erectile dysfunction   . Hemorrhoids   . History of skin cancer    basal cell removed left arm 09/07/14  . Hx of colonic polyps   . Hyperlipidemia   . Hypertension    orthostatic  . Hypertriglyceridemia   . Insulin resistance   .  Rotator cuff tear    left  . Sleep apnea    recently diagnosed / has not started using c-pap yet  . 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 Vitamin D deficiency     Past Surgical History:  Procedure Laterality Date  . COLONOSCOPY W/ BIOPSIES AND POLYPECTOMY  multiple, 2005, 08, 14  . [place]   right / for [place]  . NASAL SINUS SURGERY    . RECTAL BIOPSY    . SHOULDER OPEN ROTATOR CUFF REPAIR Left 09/14/2014   Procedure: LEFT SHOULDER MINI OPEN ROTATOR CUFF REPAIR AND SUBACROMIAL DECOMPRESSION ;  Surgeon: Susa Day, MD;  Location: [facility];  Service: Orthopedics;  Laterality: Left;    History  Smoking Status  . Never Smoker  Smokeless Tobacco  . Never Used    History  Alcohol Use  . Yes    Comment: rarely    Family History  Problem Relation Age of Onset  . Colon cancer Neg Hx     Reviw of Systems:  Reviewed in the HPI.  All other systems are negative.  Physical Exam: Blood pressure 108/72, pulse 66, height 6' (1.829 m), weight 219 lb 12.8 oz (99.7 kg). General: Well developed, well nourished, in no acute distress.  Head: Normocephalic, atraumatic, sclera non-icteric, mucus membranes are moist,   Neck: Supple. Negative for carotid bruits. JVD not elevated.  Lungs: Clear bilaterally to auscultation without wheezes, rales, or rhonchi. Breathing is unlabored.  Heart: RRR with S1 S2. No murmurs, rubs, or gallops appreciated.  Abdomen: Soft, non-tender, non-distended with normoactive bowel sounds. No hepatomegaly. No rebound/guarding. No obvious abdominal masses.  Msk:  Strength and tone appear normal for age.  Extremities: No clubbing or cyanosis. No edema.  Distal pedal pulses are 2+ and equal bilaterally.  Neuro: Alert and oriented X 3. Moves all extremities spontaneously.  Psych:  Responds to questions appropriately with a normal affect.  ECG: 01/08/2016: Normal sinus rhythm at 66. He has no ST or T wave  changes.  Assessment / Plan:   1. Hyperlipidemia-      Continue current dose of Crestor 10 mg a day.  Will check fasting labs in the next several weeks.   2.Hypertension- well controlled. Continue  same meds   Mertie Moores, MD  01/08/2016 11:05 AM    [place],  [place], [place]  [address]  Pager [phone] Phone: [phone]; Fax: [phone]

## 2016-01-14 ENCOUNTER — Other Ambulatory Visit: Payer: Self-pay | Admitting: Cardiovascular Disease

## 2016-01-15 NOTE — Addendum Note (Signed)
Addended by: Emmaline Life on: 01/15/2016 10:57 AM   Modules accepted: Orders

## 2016-01-20 ENCOUNTER — Other Ambulatory Visit: Payer: Self-pay | Admitting: Cardiovascular Disease

## 2016-02-04 DIAGNOSIS — Z0000 Encounter for general adult medical examination without abnormal findings: Secondary | ICD-10-CM | POA: Diagnosis not present

## 2016-02-04 DIAGNOSIS — Z23 Encounter for immunization: Secondary | ICD-10-CM | POA: Diagnosis not present

## 2016-02-04 DIAGNOSIS — R946 Abnormal results of thyroid function studies: Secondary | ICD-10-CM | POA: Diagnosis not present

## 2016-02-04 DIAGNOSIS — E785 Hyperlipidemia, unspecified: Secondary | ICD-10-CM | POA: Diagnosis not present

## 2016-02-04 DIAGNOSIS — N183 Chronic kidney disease, stage 3 (moderate): Secondary | ICD-10-CM | POA: Diagnosis not present

## 2016-02-04 DIAGNOSIS — R7989 Other specified abnormal findings of blood chemistry: Secondary | ICD-10-CM | POA: Diagnosis not present

## 2016-02-04 DIAGNOSIS — E119 Type 2 diabetes mellitus without complications: Secondary | ICD-10-CM | POA: Diagnosis not present

## 2016-02-19 ENCOUNTER — Telehealth: Payer: Self-pay | Admitting: *Deleted

## 2016-02-19 NOTE — Telephone Encounter (Signed)
Returned pt call. Sooner appt scheduled for tomorrow a.m.

## 2016-02-20 ENCOUNTER — Ambulatory Visit (INDEPENDENT_AMBULATORY_CARE_PROVIDER_SITE_OTHER): Payer: Medicare Other | Admitting: Neurology

## 2016-02-20 ENCOUNTER — Encounter: Payer: Self-pay | Admitting: Neurology

## 2016-02-20 VITALS — BP 98/65 | HR 81 | Resp 18 | Ht 72.0 in | Wt 220.0 lb

## 2016-02-20 DIAGNOSIS — G44059 Short lasting unilateral neuralgiform headache with conjunctival injection and tearing (SUNCT), not intractable: Secondary | ICD-10-CM | POA: Diagnosis not present

## 2016-02-20 NOTE — Progress Notes (Signed)
Reason for visit: SUNCT headache  Christian Ross is an 75 y.o. 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 The patient has done quite well on Lamictal and Topamax, he has partially tapered off of the Lamictal. He has not had any significant headache in 3-1/2 years. The patient is still on 150 mg of the Topamax twice daily. The patient denies any new medical issues that have come up since last seen. He is tolerating the medication otherwise fairly well.  Past Medical History:  Diagnosis Date  . Arthritis   . Borderline diabetes    no meds / elevated HgA1C  . Cancer ([place])    skin pre cancer nose  . Cough May 2016   new x73mo, denies fever and shortness of breath  . Diverticulitis   . Diverticulosis   . Erectile dysfunction   . Hemorrhoids   . History of skin cancer    basal cell removed left arm 09/07/14  . Hx of colonic polyps   . Hyperlipidemia   . Hypertension    orthostatic  . Hypertriglyceridemia   . Insulin resistance   . Rotator cuff tear    left  . Sleep apnea    recently diagnosed / has not started using c-pap yet  . 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 Vitamin D deficiency     Past Surgical History:  Procedure Laterality Date  . COLONOSCOPY W/ BIOPSIES AND POLYPECTOMY  multiple, 2005, 08, 14  . [place]   right / for [place]  . NASAL SINUS SURGERY    . RECTAL BIOPSY    . SHOULDER OPEN ROTATOR CUFF REPAIR Left 09/14/2014   Procedure: LEFT SHOULDER MINI OPEN ROTATOR CUFF REPAIR AND SUBACROMIAL DECOMPRESSION ;  Surgeon: Susa Day, MD;  Location: [facility];  Service: Orthopedics;  Laterality: Left;    Family History  Problem Relation Age of Onset  . Colon cancer Neg Hx     Social history:  reports that he has never smoked. He has never used smokeless tobacco. He reports that he drinks alcohol. He reports that he does not use  drugs.    Allergies  Allergen Reactions  . Lisinopril Cough    Causes a cough    Medications:  Prior to Admission medications   Medication Sig Start Date End Date Taking? Authorizing Provider  Ascorbic Acid (VITAMIN C PO) Take 3 g by mouth every morning.    Yes Historical Provider, MD  aspirin 81 MG tablet Take 81 mg by mouth every evening.    Yes Historical Provider, MD  Berberine Chloride POWD Take 800 mg by mouth every morning.    Yes Historical Provider, MD  erythromycin ophthalmic ointment Place 1 application into both eyes at bedtime as needed (for eye redness).  10/23/14  Yes Historical Provider, MD  fenofibrate (TRICOR) 145 MG tablet Take 1 tablet (145 mg total) by mouth every evening. 03/20/15  Yes Thayer Headings, MD  Glucosamine-Chondroit-Vit C-Mn (GLUCOSAMINE CHONDR 1500 COMPLX PO) Take 1 tablet by mouth 2 (two) times daily.   Yes Historical Provider, MD  lamoTRIgine (LAMICTAL) 25 MG tablet Take 4 tablets (100 mg total) by mouth 2 (two) times daily. Pt prefers Lawyer.Has adverse effects on A[employer]ANE Manufacture Patient taking differently: Take 100 mg by mouth daily. Pt prefers Lawyer.Has adverse effects on [facility]ure  01/29/15  Yes Kathrynn Ducking, MD  Lecithin 1200 MG CAPS Take 2 capsules by mouth every morning.   Yes Historical Provider, MD  loratadine (CLARITIN) 10 MG tablet Take 10 mg by mouth daily as needed for allergies.   Yes Historical Provider, MD  metoprolol tartrate (LOPRESSOR) 25 MG tablet TAKE 1 TABLET TWICE A DAY 01/21/16  Yes Thayer Headings, MD  mometasone (NASONEX) 50 MCG/ACT nasal spray Place 2 sprays into the nose daily as needed (allergies).   Yes Historical Provider, MD  omega-3 acid ethyl esters (LOVAZA) 1 G capsule Take 3 g by mouth daily.    Yes Historical Provider, MD  OVER THE COUNTER MEDICATION Take 2 tablets by mouth every morning. [place]   Yes Historical Provider, MD  oxyCODONE-acetaminophen  (PERCOCET) 5-325 MG per tablet Take 1 tablet by mouth every 4 (four) hours as needed. 09/14/14  Yes Susa Day, MD  rosuvastatin (CRESTOR) 10 MG tablet Take 1 tablet (10 mg total) by mouth at bedtime. 12/05/14  Yes Thayer Headings, MD  tamsulosin (FLOMAX) 0.4 MG CAPS capsule Take 1 po QD until you pass the stone. 11/17/14  Yes Rolland Porter, MD  topiramate (TOPAMAX) 25 MG tablet TAKE 6 TABLETS TWICE A DAY 07/18/15  Yes Kathrynn Ducking, MD  Ubiquinol 200 MG CAPS Take 400 mg by mouth every morning.   Yes Historical Provider, MD  valsartan (DIOVAN) 160 MG tablet Take 1 tablet (160 mg total) by mouth daily. 01/14/16  Yes Thayer Headings, MD    ROS:  Out of a complete 14 system review of symptoms, the patient complains only of the following symptoms, and all other reviewed systems are negative.  Headache  Blood pressure 98/65, pulse 81, resp. rate 18, height 6' (1.829 m), weight 220 lb (99.8 kg).  Physical Exam  General: The patient is alert and cooperative at the time of the examination.  Skin: No significant peripheral edema is noted.   Neurologic Exam  Mental status: The patient is alert and oriented x 3 at the time of the examination. The patient has apparent normal recent and remote memory, with an apparently normal attention span and concentration ability.   Cranial nerves: Facial symmetry is present. Speech is normal, no aphasia or dysarthria is noted. Extraocular movements are full. Visual fields are full.  Motor: The patient has good strength in all 4 extremities.  Sensory examination: Soft touch sensation is symmetric on the face, arms, and legs.  Coordination: The patient has good finger-nose-finger and heel-to-shin bilaterally.  Gait and station: The patient has a normal gait. Tandem gait is normal. Romberg is negative. No drift is seen.  Reflexes: Deep tendon reflexes are symmetric.   Assessment/Plan:  1. SUNCT headache  The patient will taper off of the Lamictal by 25  mg every 2 weeks. If he does well with this, he is to contact our office and we may initiate a taper off of the Topamax as well getting down to 50 mg twice daily. The patient will follow-up through this office in about 6 months. If the patient has any twinges of pain, he will get back on the medication quickly.  Jill Alexanders MD 02/20/2016 7:44 AM  [facility] [address] [place], [place] [address]  Phone [phone] Fax [phone]

## 2016-02-20 NOTE — Patient Instructions (Signed)
   Taper the Lamictal by 25 mg every 2 weeks until off the medication.  Call at that time, if you are doing well, we may consider a slow taper of the topamax as well.

## 2016-02-21 ENCOUNTER — Ambulatory Visit: Payer: Medicare Other | Admitting: Neurology

## 2016-02-26 ENCOUNTER — Other Ambulatory Visit: Payer: Self-pay | Admitting: *Deleted

## 2016-02-26 ENCOUNTER — Other Ambulatory Visit: Payer: Self-pay | Admitting: Cardiovascular Disease

## 2016-02-26 MED ORDER — ROSUVASTATIN CALCIUM 10 MG PO TABS: 10.0000 mg | ORAL_TABLET | Freq: Every day | ORAL | 3 refills | Status: DC

## 2016-02-26 NOTE — Telephone Encounter (Signed)
°*  STAT* If patient is at the pharmacy, call can be transferred to refill team.   1. Which medications need to be refilled? (please list name of each medication and dose if known) Crestor 10 mg    2. Which pharmacy/location (including street and city if local pharmacy) is medication to be sent to?[facility]   3. Do they need a 30 day or 90 day supply?[place]

## 2016-03-03 DIAGNOSIS — D1801 Hemangioma of skin and subcutaneous tissue: Secondary | ICD-10-CM | POA: Diagnosis not present

## 2016-03-03 DIAGNOSIS — L570 Actinic keratosis: Secondary | ICD-10-CM | POA: Diagnosis not present

## 2016-03-03 DIAGNOSIS — Z23 Encounter for immunization: Secondary | ICD-10-CM | POA: Diagnosis not present

## 2016-03-03 DIAGNOSIS — L309 Dermatitis, unspecified: Secondary | ICD-10-CM | POA: Diagnosis not present

## 2016-03-03 DIAGNOSIS — L814 Other melanin hyperpigmentation: Secondary | ICD-10-CM | POA: Diagnosis not present

## 2016-03-03 DIAGNOSIS — L723 Sebaceous cyst: Secondary | ICD-10-CM | POA: Diagnosis not present

## 2016-03-03 DIAGNOSIS — L738 Other specified follicular disorders: Secondary | ICD-10-CM | POA: Diagnosis not present

## 2016-03-03 DIAGNOSIS — Z86018 Personal history of other benign neoplasm: Secondary | ICD-10-CM | POA: Diagnosis not present

## 2016-03-03 DIAGNOSIS — D225 Melanocytic nevi of trunk: Secondary | ICD-10-CM | POA: Diagnosis not present

## 2016-03-03 DIAGNOSIS — L821 Other seborrheic keratosis: Secondary | ICD-10-CM | POA: Diagnosis not present

## 2016-03-03 DIAGNOSIS — Z85828 Personal history of other malignant neoplasm of skin: Secondary | ICD-10-CM | POA: Diagnosis not present

## 2016-03-14 ENCOUNTER — Other Ambulatory Visit: Payer: Self-pay | Admitting: Cardiovascular Disease

## 2016-04-01 ENCOUNTER — Ambulatory Visit: Payer: Medicare Other | Admitting: Neurology

## 2016-04-13 ENCOUNTER — Other Ambulatory Visit: Payer: Self-pay | Admitting: Neurology

## 2016-05-20 DIAGNOSIS — H2513 Age-related nuclear cataract, bilateral: Secondary | ICD-10-CM | POA: Diagnosis not present

## 2016-05-20 DIAGNOSIS — H524 Presbyopia: Secondary | ICD-10-CM | POA: Diagnosis not present

## 2016-05-20 DIAGNOSIS — H43812 Vitreous degeneration, left eye: Secondary | ICD-10-CM | POA: Diagnosis not present

## 2016-05-20 DIAGNOSIS — H01001 Unspecified blepharitis right upper eyelid: Secondary | ICD-10-CM | POA: Diagnosis not present

## 2016-05-20 DIAGNOSIS — H01002 Unspecified blepharitis right lower eyelid: Secondary | ICD-10-CM | POA: Diagnosis not present

## 2016-07-17 ENCOUNTER — Telehealth: Payer: Self-pay | Admitting: *Deleted

## 2016-07-17 DIAGNOSIS — E782 Mixed hyperlipidemia: Secondary | ICD-10-CM

## 2016-07-17 MED ORDER — OMEGA-3-ACID ETHYL ESTERS 1 G PO CAPS: 3.0000 g | ORAL_CAPSULE | Freq: Every day | ORAL | 3 refills | Status: DC

## 2016-07-17 NOTE — Telephone Encounter (Signed)
Scheduled patient for lab work on Tuesday April 3. He is aware Rx has been sent to [facility]. He thanked me for the call.

## 2016-07-17 NOTE — Telephone Encounter (Signed)
called to inform pt that he will have to have fasting labs if Dr Acie Fredrickson takes over his [place], pt agreeable to plan, will forward back to [facility] for her call and get pt set up with appt.

## 2016-07-17 NOTE — Telephone Encounter (Signed)
Patient will need fasting lab work with Dr. Acie Fredrickson if he takes over filling this medication

## 2016-07-21 ENCOUNTER — Other Ambulatory Visit: Payer: Self-pay | Admitting: *Deleted

## 2016-07-21 NOTE — Telephone Encounter (Signed)
error 

## 2016-07-22 ENCOUNTER — Other Ambulatory Visit: Payer: Medicare Other

## 2016-07-23 ENCOUNTER — Other Ambulatory Visit: Payer: Medicare Other | Admitting: *Deleted

## 2016-07-23 ENCOUNTER — Other Ambulatory Visit: Payer: Self-pay | Admitting: Nurse Practitioner

## 2016-07-23 DIAGNOSIS — R739 Hyperglycemia, unspecified: Secondary | ICD-10-CM

## 2016-07-23 DIAGNOSIS — E782 Mixed hyperlipidemia: Secondary | ICD-10-CM

## 2016-07-23 LAB — COMPREHENSIVE METABOLIC PANEL
ALT: 28 IU/L (ref 0–44)
AST: 21 IU/L (ref 0–40)
Albumin/Globulin Ratio: 1.5 (ref 1.2–2.2)
Albumin: 4.3 g/dL (ref 3.5–4.8)
Alkaline Phosphatase: 37 IU/L — ABNORMAL LOW (ref 39–117)
BUN/Creatinine Ratio: 20 (ref 10–24)
BUN: 35 mg/dL — ABNORMAL HIGH (ref 8–27)
Bilirubin Total: 0.3 mg/dL (ref 0.0–1.2)
CALCIUM: 9.6 mg/dL (ref 8.6–10.2)
CO2: 18 mmol/L (ref 18–29)
CREATININE: 1.73 mg/dL — AB (ref 0.76–1.27)
Chloride: 103 mmol/L (ref 96–106)
GFR calc Af Amer: 44 mL/min/{1.73_m2} — ABNORMAL LOW (ref >59)
GFR, EST NON AFRICAN AMERICAN: 38 mL/min/{1.73_m2} — AB (ref >59)
Globulin, Total: 2.8 g/dL (ref 1.5–4.5)
Glucose: 161 mg/dL — ABNORMAL HIGH (ref 65–99)
Potassium: 4.5 mmol/L (ref 3.5–5.2)
Sodium: 140 mmol/L (ref 134–144)
Total Protein: 7.1 g/dL (ref 6.0–8.5)

## 2016-07-23 LAB — LIPID PANEL
CHOL/HDL RATIO: 5.1 ratio — AB (ref 0.0–5.0)
CHOLESTEROL TOTAL: 152 mg/dL (ref 100–199)
HDL: 30 mg/dL — ABNORMAL LOW (ref >39)
LDL CALC: 65 mg/dL (ref 0–99)
Triglycerides: 287 mg/dL — ABNORMAL HIGH (ref 0–149)
VLDL Cholesterol Cal: 57 mg/dL — ABNORMAL HIGH (ref 5–40)

## 2016-07-23 NOTE — Addendum Note (Signed)
Addended by: Eulis Foster on: 07/23/2016 09:03 AM   Modules accepted: Orders

## 2016-07-24 LAB — HEMOGLOBIN A1C
ESTIMATED AVERAGE GLUCOSE: 171 mg/dL
Hgb A1c MFr Bld: 7.6 % — ABNORMAL HIGH (ref 4.8–5.6)

## 2016-08-13 DIAGNOSIS — E785 Hyperlipidemia, unspecified: Secondary | ICD-10-CM | POA: Diagnosis not present

## 2016-08-13 DIAGNOSIS — I10 Essential (primary) hypertension: Secondary | ICD-10-CM | POA: Diagnosis not present

## 2016-08-13 DIAGNOSIS — E119 Type 2 diabetes mellitus without complications: Secondary | ICD-10-CM | POA: Diagnosis not present

## 2016-08-13 DIAGNOSIS — N183 Chronic kidney disease, stage 3 (moderate): Secondary | ICD-10-CM | POA: Diagnosis not present

## 2016-08-19 ENCOUNTER — Ambulatory Visit: Payer: Medicare Other | Admitting: Neurology

## 2016-09-08 ENCOUNTER — Ambulatory Visit: Payer: Medicare Other | Admitting: Cardiovascular Disease

## 2016-09-09 ENCOUNTER — Other Ambulatory Visit: Payer: Self-pay | Admitting: Cardiovascular Disease

## 2016-09-09 MED ORDER — OMEGA-3-ACID ETHYL ESTERS 1 G PO CAPS: 3.0000 g | ORAL_CAPSULE | Freq: Every day | ORAL | 1 refills | Status: DC

## 2016-09-10 ENCOUNTER — Ambulatory Visit (INDEPENDENT_AMBULATORY_CARE_PROVIDER_SITE_OTHER): Payer: Medicare Other | Admitting: Neurology

## 2016-09-10 ENCOUNTER — Encounter: Payer: Self-pay | Admitting: Neurology

## 2016-09-10 VITALS — BP 108/65 | HR 67 | Ht 72.0 in | Wt 217.5 lb

## 2016-09-10 DIAGNOSIS — G44059 Short lasting unilateral neuralgiform headache with conjunctival injection and tearing (SUNCT), not intractable: Secondary | ICD-10-CM

## 2016-09-10 NOTE — Patient Instructions (Signed)
   Taper the Topamax by 50 mg every 2 weeks until off the medication:  Start 5 tablets twice a day for 2 weeks, then  Take 4 tablets twice a day for 2 weeks, then  Take 3 tablets twice a day for 2 weeks, then  Take 2 tablets twice a day for 2 weeks, then  Take one tablet twice a day for 2 weeks, then   Stop.

## 2016-09-10 NOTE — Progress Notes (Signed)
Reason for visit: SUNCT headache  Christian Ross is an 76 y.o. male  History of present illness:  Christian Ross is a 76 year old right-handed white male with a history of SUNCT headaches that have responded to Lamictal and Topamax. He has not had any headaches for several years, he has tapered off of the Lamictal and has done well. He remains on Topamax taking 150 mg twice daily. The patient is tolerating the medication, but he is motivated to try to come off the medication if possible. He has reported no other new medical issues that have come up since last seen.  Past Medical History:  Diagnosis Date  . Arthritis   . Borderline diabetes    no meds / elevated HgA1C  . Cancer ([place])    skin pre cancer nose  . Cough May 2016   new x39mo, denies fever and shortness of breath  . Diverticulitis   . Diverticulosis   . Erectile dysfunction   . Hemorrhoids   . History of skin cancer    basal cell removed left arm 09/07/14  . Hx of colonic polyps   . Hyperlipidemia   . Hypertension    orthostatic  . Hypertriglyceridemia   . Insulin resistance   . Rotator cuff tear    left  . Sleep apnea    recently diagnosed / has not started using c-pap yet  . 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 Vitamin D deficiency     Past Surgical History:  Procedure Laterality Date  . COLONOSCOPY W/ BIOPSIES AND POLYPECTOMY  multiple, 2005, 08, 14  . [place]   right / for [place]  . NASAL SINUS SURGERY    . RECTAL BIOPSY    . SHOULDER OPEN ROTATOR CUFF REPAIR Left 09/14/2014   Procedure: LEFT SHOULDER MINI OPEN ROTATOR CUFF REPAIR AND SUBACROMIAL DECOMPRESSION ;  Surgeon: Susa Day, MD;  Location: [facility];  Service: Orthopedics;  Laterality: Left;    Family History  Problem Relation Age of Onset  . Colon cancer Neg Hx     Social history:  reports that he has never smoked. He has never used smokeless tobacco. He reports that he drinks alcohol. He reports  that he does not use drugs.    Allergies  Allergen Reactions  . Lisinopril Cough    Causes a cough    Medications:  Prior to Admission medications   Medication Sig Start Date End Date Taking? Authorizing Provider  Ascorbic Acid (VITAMIN C PO) Take 3 g by mouth every morning.    Yes [provider]  aspirin 81 MG tablet Take 81 mg by mouth every evening.    Yes [provider]  Berberine Chloride POWD Take 800 mg by mouth every morning.    Yes [provider]  erythromycin ophthalmic ointment Place 1 application into both eyes at bedtime as needed (for eye redness).  10/23/14  Yes [provider]  fenofibrate (TRICOR) 145 MG tablet Take 1 tablet (145 mg total) by mouth every evening. 03/17/16  Yes Nahser, Wonda Cheng, MD  Glucosamine-Chondroit-Vit C-Mn (GLUCOSAMINE CHONDR 1500 COMPLX PO) Take 1 tablet by mouth 2 (two) times daily.   Yes [provider]  Lecithin 1200 MG CAPS Take 2 capsules by mouth every morning.   Yes [provider]  loratadine (CLARITIN) 10 MG tablet Take 10 mg by mouth daily as needed for allergies.   Yes [provider]  metoprolol tartrate (  LOPRESSOR) 25 MG tablet TAKE 1 TABLET TWICE A DAY 01/21/16  Yes Nahser, Wonda Cheng, MD  mometasone (NASONEX) 50 MCG/ACT nasal spray Place 2 sprays into the nose daily as needed (allergies).   Yes [provider]  omega-3 acid ethyl esters (LOVAZA) 1 g capsule Take 3 capsules (3 g total) by mouth daily. 09/09/16  Yes Nahser, Wonda Cheng, MD  OVER THE COUNTER MEDICATION Take 2 tablets by mouth every morning. [place]   Yes [provider]  oxyCODONE-acetaminophen (PERCOCET) 5-325 MG per tablet Take 1 tablet by mouth every 4 (four) hours as needed. 09/14/14  Yes Susa Day, MD  rosuvastatin (CRESTOR) 10 MG tablet Take 1 tablet (10 mg total) by mouth at bedtime. 02/26/16  Yes Nahser, Wonda Cheng, MD  tamsulosin (FLOMAX) 0.4 MG CAPS capsule Take 1 po QD until you  pass the stone. 11/17/14  Yes Rolland Porter, MD  topiramate (TOPAMAX) 25 MG tablet TAKE 6 TABLETS TWICE A DAY 04/16/16  Yes Kathrynn Ducking, MD  Ubiquinol 200 MG CAPS Take 400 mg by mouth every morning.   Yes [provider]  valsartan (DIOVAN) 160 MG tablet Take 1 tablet (160 mg total) by mouth daily. 01/14/16  Yes Nahser, Wonda Cheng, MD    ROS:  Out of a complete 14 system review of symptoms, the patient complains only of the following symptoms, and all other reviewed systems are negative.  Sleep apnea on CPAP  Blood pressure 108/65, pulse 67, height 6' (1.829 m), weight 217 lb 8 oz (98.7 kg).  Physical Exam  General: The patient is alert and cooperative at the time of the examination.  Skin: No significant peripheral edema is noted.   Neurologic Exam  Mental status: The patient is alert and oriented x 3 at the time of the examination. The patient has apparent normal recent and remote memory, with an apparently normal attention span and concentration ability.   Cranial nerves: Facial symmetry is present. Speech is normal, no aphasia or dysarthria is noted. Extraocular movements are full. Visual fields are full.  Motor: The patient has good strength in all 4 extremities.  Sensory examination: Soft touch sensation is symmetric on the face, arms, and legs.  Coordination: The patient has good finger-nose-finger and heel-to-shin bilaterally.  Gait and station: The patient has a normal gait. Tandem gait is slightly unsteady. Romberg is negative. No drift is seen.  Reflexes: Deep tendon reflexes are symmetric.   Assessment/Plan:  1. SUNCT headaches  The patient is doing quite well at this time, we will taper off of the Topamax by 50 mg every 2 weeks until off the drug. If the patient starts having headaches again, he will contact our office, otherwise he will follow-up through this office on an as-needed basis.  Jill Alexanders MD 09/10/2016 8:10 AM  [facility] [address] [place], [place] [address]  Phone [phone] Fax [phone]

## 2016-09-24 ENCOUNTER — Encounter: Payer: Medicare Other | Attending: Family Medicine | Admitting: Registered"

## 2016-09-24 ENCOUNTER — Encounter: Payer: Self-pay | Admitting: Registered"

## 2016-09-24 DIAGNOSIS — Z713 Dietary counseling and surveillance: Secondary | ICD-10-CM | POA: Insufficient documentation

## 2016-09-24 DIAGNOSIS — E119 Type 2 diabetes mellitus without complications: Secondary | ICD-10-CM | POA: Diagnosis not present

## 2016-09-24 NOTE — Progress Notes (Signed)
Diabetes Self-Management Education  Visit Type: First/Initial  Appt. Start Time: 1400 Appt. End Time: 1525  09/24/2016  [name], identified by name and date of birth, is a 76 y.o. male with a diagnosis of Diabetes: Type 2.   ASSESSMENT Pt states he was just diagnosed with T2DM. Pt reports his wife tries to get him to eat better and she will cook healthy lunches.   Pt reports taking several supplements (listed with medications in chart) to help with diabetes, heart health, and to manage arthritis pain.     Diabetes Self-Management Education - 09/24/16 1407      Visit Information   Visit Type First/Initial     Initial Visit   Diabetes Type Type 2   Are you taking your medications as prescribed? Not on Medications  no DM specific medications   Date Diagnosed last month     Health Coping   How would you rate your overall health? Good     Psychosocial Assessment   Patient Belief/Attitude about Diabetes Motivated to manage diabetes   Preferred Learning Style Auditory;Hands on   Learning Readiness Change in progress  pt has started cutting back on soda   What is the last grade level you completed in school? BSBA Degree     Complications   Last HgB A1C per patient/outside source 7.6 %  per pt, 08/2016   How often do you check your blood sugar? --  once a month   Fasting Blood glucose range (mg/dL) --  160    Number of hypoglycemic episodes per month 0   Have you had a dilated eye exam in the past 12 months? Yes   Have you had a dental exam in the past 12 months? Yes   Are you checking your feet? Yes   How many days per week are you checking your feet? 1     Dietary Intake   Breakfast cheerios, ~20oz 2% milk   Snack (morning) none   Lunch burger, 1/2&1/2 sweet tea OR salmon, grain, carrots   Dinner carrots, celery & dip   Snack (evening) dark chocolate, 2 or 3 squares, (watching TV crackers, cheese OR PB crackers OR cheetos)   Beverage(s) propel water, soda,  1/2&1/2 unsweet/sweet tea, a lot of 2% milk     Exercise   Exercise Type Light (walking / raking leaves)  1.6 miles   How many days per week to you exercise? 3   How many minutes per day do you exercise? 40   Total minutes per week of exercise 120     Patient Education   Previous Diabetes Education No   Disease state  Definition of diabetes, type 1 and 2, and the diagnosis of diabetes;Factors that contribute to the development of diabetes   Nutrition management  Role of diet in the treatment of diabetes and the relationship between the three main macronutrients and blood glucose level;Food label reading, portion sizes and measuring food.;Carbohydrate counting;Reviewed blood glucose goals for pre and post meals and how to evaluate the patients' food intake on their blood glucose level.   Physical activity and exercise  Role of exercise on diabetes management, blood pressure control and cardiac health.   Monitoring Identified appropriate SMBG and/or A1C goals.   Chronic complications Relationship between chronic complications and blood glucose control     Individualized Goals (developed by patient)   Nutrition Follow meal plan discussed   Physical Activity 15 minutes per day     Outcomes  Expected Outcomes Demonstrated interest in learning. Expect positive outcomes   Future DMSE PRN   Program Status Completed     Individualized Plan for Diabetes Self-Management Training:   Learning Objective:  Patient will have a greater understanding of diabetes self-management. Patient education plan is to attend individual and/or group sessions per assessed needs and concerns.  Patient Instructions  Plan:   Aim for 2-4 Carb Choices per meal (30-60 grams)   Aim for 0-2 Carbs per snack if hungry   Include protein with your meals and snacks  Consider reading food labels for Total Carbohydrate and Fat Grams of foods  Consider increasing your activity daily as tolerated  Consider checking BG  at alternate times per day as directed by MD   Consider checking 2 hours after several meals to see how they affect your BG   Consider cutting down beverages with sugar  Consider have 1-2 servings of nuts daily  Consider adding more beans to your diet, use Vegetarian protein handout for ideas of different beans   Expected Outcomes:  Demonstrated interest in learning. Expect positive outcomes  Education material provided: Living Well with Diabetes, A1C conversion sheet, My Plate, Snack sheet and Carbohydrate counting sheet, vegetarian protein handout   If problems or questions, patient to contact team via:  Phone and MyChart  Future DSME appointment: PRN

## 2016-09-24 NOTE — Patient Instructions (Addendum)
Plan:   Aim for 2-4 Carb Choices per meal (30-60 grams)   Aim for 0-2 Carbs per snack if hungry   Include protein with your meals and snacks  Consider reading food labels for Total Carbohydrate and Fat Grams of foods  Consider increasing your activity daily as tolerated  Consider checking BG at alternate times per day as directed by MD   Consider checking 2 hours after several meals to see how they affect your BG   Consider cutting down beverages with sugar  Consider have 1-2 servings of nuts daily  Consider adding more beans to your diet, use Vegetarian protein handout for ideas of different beans

## 2016-10-20 ENCOUNTER — Ambulatory Visit (INDEPENDENT_AMBULATORY_CARE_PROVIDER_SITE_OTHER): Payer: Medicare Other | Admitting: Cardiovascular Disease

## 2016-10-20 ENCOUNTER — Encounter: Payer: Self-pay | Admitting: Cardiovascular Disease

## 2016-10-20 VITALS — BP 124/82 | HR 65 | Ht 72.0 in | Wt 214.8 lb

## 2016-10-20 DIAGNOSIS — E782 Mixed hyperlipidemia: Secondary | ICD-10-CM | POA: Diagnosis not present

## 2016-10-20 DIAGNOSIS — I10 Essential (primary) hypertension: Secondary | ICD-10-CM

## 2016-10-20 LAB — COMPREHENSIVE METABOLIC PANEL
A/G RATIO: 1.5 (ref 1.2–2.2)
ALT: 33 IU/L (ref 0–44)
AST: 26 IU/L (ref 0–40)
Albumin: 4.4 g/dL (ref 3.5–4.8)
Alkaline Phosphatase: 48 IU/L (ref 39–117)
BUN/Creatinine Ratio: 22 (ref 10–24)
BUN: 34 mg/dL — AB (ref 8–27)
Bilirubin Total: 0.3 mg/dL (ref 0.0–1.2)
CO2: 19 mmol/L — AB (ref 20–29)
CREATININE: 1.58 mg/dL — AB (ref 0.76–1.27)
Calcium: 9.8 mg/dL (ref 8.6–10.2)
Chloride: 101 mmol/L (ref 96–106)
GFR, EST AFRICAN AMERICAN: 49 mL/min/{1.73_m2} — AB (ref >59)
GFR, EST NON AFRICAN AMERICAN: 42 mL/min/{1.73_m2} — AB (ref >59)
Globulin, Total: 3 g/dL (ref 1.5–4.5)
Glucose: 177 mg/dL — ABNORMAL HIGH (ref 65–99)
Potassium: 4.6 mmol/L (ref 3.5–5.2)
Sodium: 137 mmol/L (ref 134–144)
TOTAL PROTEIN: 7.4 g/dL (ref 6.0–8.5)

## 2016-10-20 LAB — LIPID PANEL
CHOL/HDL RATIO: 6.2 ratio — AB (ref 0.0–5.0)
Cholesterol, Total: 206 mg/dL — ABNORMAL HIGH (ref 100–199)
HDL: 33 mg/dL — AB (ref >39)
LDL Calculated: 120 mg/dL — ABNORMAL HIGH (ref 0–99)
TRIGLYCERIDES: 265 mg/dL — AB (ref 0–149)
VLDL CHOLESTEROL CAL: 53 mg/dL — AB (ref 5–40)

## 2016-10-20 MED ORDER — ROSUVASTATIN CALCIUM 10 MG PO TABS: 10.0000 mg | ORAL_TABLET | Freq: Every day | ORAL | 3 refills | Status: DC

## 2016-10-20 NOTE — Progress Notes (Signed)
Christian Ross Date of Birth  1940-07-11             1. Hyperlipidemia 2.Hypertension  History of Present Illness:  Christian Ross seems to be doing well.  He continues to have elevations in his triglyceride levels.  He is tolerating the crestor now.    Sept. 15, 2014:   Still active flying, not exercising as much as he should.  No CP or dyspnea.  Sept. 24, 2015:  Christian Ross is doing well.  Has lost 10 lbs since  I last saw him.  Exercising more.  HbA1C  Has improved.   He is no longer flying because he is on Lamactil.    Sept. 26, 2016:     Doing well from a cardiac standpoint On Flomax for BPH.  No cardiac issues.   Sept. 19, 2017  Doing well from a cardiac standpoint. Had some foot issues.   Was in a boot for several months  Now has orthotic arch supports in his shoes.  Glucose has been slightly elevated.    October 20, 2016:  Christian Ross is seen today for follow up .  No CP or dyspnea Is walking more Is no longer flying - has cluster headaches    Current Outpatient Prescriptions on File Prior to Visit  Medication Sig Dispense Refill  . Ascorbic Acid (VITAMIN C PO) Take 3 g by mouth every morning.     Marland Kitchen aspirin 81 MG tablet Take 81 mg by mouth every evening.     . Berberine Chloride POWD Take 800 mg by mouth every morning.     Marland Kitchen erythromycin ophthalmic ointment Place 1 application into both eyes at bedtime as needed (for eye redness).   2  . fenofibrate (TRICOR) 145 MG tablet Take 1 tablet (145 mg total) by mouth every evening. 90 tablet 2  . Glucosamine-Chondroit-Vit C-Mn (GLUCOSAMINE CHONDR 1500 COMPLX PO) Take 1 tablet by mouth 2 (two) times daily.    . Lecithin 1200 MG CAPS Take 2 capsules by mouth every morning.    . loratadine (CLARITIN) 10 MG tablet Take 10 mg by mouth daily as needed for allergies.    . metoprolol tartrate (LOPRESSOR) 25 MG tablet TAKE 1 TABLET TWICE A DAY 180 tablet 3  . mometasone (NASONEX) 50 MCG/ACT nasal spray Place 2 sprays into the nose daily  as needed (allergies).    Marland Kitchen omega-3 acid ethyl esters (LOVAZA) 1 g capsule Take 3 capsules (3 g total) by mouth daily. 270 capsule 1  . OVER THE COUNTER MEDICATION Take 2 tablets by mouth every morning. [place]    . topiramate (TOPAMAX) 25 MG tablet TAKE 6 TABLETS TWICE A DAY (Patient taking differently: PATIENT TAKES 3 TABLETS BY MOUTH TWICE DAILY) 1080 tablet 2  . Ubiquinol 200 MG CAPS Take 400 mg by mouth every morning.    . valsartan (DIOVAN) 160 MG tablet Take 1 tablet (160 mg total) by mouth daily. 90 tablet 3  . rosuvastatin (CRESTOR) 10 MG tablet Take 1 tablet (10 mg total) by mouth at bedtime. (Patient not taking: Reported on 10/20/2016) 90 tablet 3  . [DISCONTINUED] irbesartan (AVAPRO) 150 MG tablet Take 1 tablet (150 mg total) by mouth daily. 90 tablet 3   No current facility-administered medications on file prior to visit.     Allergies  Allergen Reactions  . Lisinopril Cough    Causes a cough    Past Medical History:  Diagnosis Date  . Arthritis   .  Borderline diabetes    no meds / elevated HgA1C  . Cancer ([place])    skin pre cancer nose  . Cough May 2016   new x44mo, denies fever and shortness of breath  . Diverticulitis   . Diverticulosis   . Erectile dysfunction   . Hemorrhoids   . History of skin cancer    basal cell removed left arm 09/07/14  . Hx of colonic polyps   . Hyperlipidemia   . Hypertension    orthostatic  . Hypertriglyceridemia   . Insulin resistance   . Rotator cuff tear    left  . Sleep apnea    recently diagnosed / has not started using c-pap yet  . 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 Vitamin D deficiency     Past Surgical History:  Procedure Laterality Date  . COLONOSCOPY W/ BIOPSIES AND POLYPECTOMY  multiple, 2005, 08, 14  . [place]   right / for [place]  . NASAL SINUS SURGERY    . RECTAL BIOPSY    . SHOULDER OPEN ROTATOR CUFF REPAIR Left 09/14/2014   Procedure: LEFT SHOULDER MINI OPEN ROTATOR  CUFF REPAIR AND SUBACROMIAL DECOMPRESSION ;  Surgeon: Susa Day, MD;  Location: [facility];  Service: Orthopedics;  Laterality: Left;    History  Smoking Status  . Never Smoker  Smokeless Tobacco  . Never Used    History  Alcohol Use  . Yes    Comment: rarely    Family History  Problem Relation Age of Onset  . Colon cancer Neg Hx     Reviw of Systems:  Reviewed in the HPI.  All other systems are negative.  Physical Exam: Blood pressure 124/82, pulse 65, height 6' (1.829 m), weight 214 lb 12.8 oz (97.4 kg), SpO2 91 %. General: Well developed, well nourished, in no acute distress.  Head: Normocephalic, atraumatic, sclera non-icteric, mucus membranes are moist,   Neck: Supple. Negative for carotid bruits. JVD not elevated.  Lungs: Clear bilaterally to auscultation without wheezes, rales, or rhonchi. Breathing is unlabored.  Heart: RRR with S1 S2. No murmurs, rubs, or gallops appreciated.  Abdomen: Soft, non-tender, non-distended with normoactive bowel sounds. No hepatomegaly. No rebound/guarding. No obvious abdominal masses.  Msk:  Strength and tone appear normal for age.  Extremities: No clubbing or cyanosis. No edema.  Distal pedal pulses are 2+ and equal bilaterally.  Neuro: Alert and oriented X 3. Moves all extremities spontaneously.  Psych:  Responds to questions appropriately with a normal affect.  ECG: October 20, 2016: NSR at 53.   No ST or T wave changes  Assessment / Plan:   1. Hyperlipidemia-      He has run out of his crestor. He had a cough that resolved after he stopped the crestor. I have not heard of crestor causing a cough  The cough may be an allergy issue  Will check lipids today I anticipate reordering the crestor .   He will pay attention to see if the cough returns.   2.Hypertension- well controlled. Continue  same meds   Mertie Moores, MD  10/20/2016 9:07 AM    [facility] [place],  [place],  [place]  [address] Pager [phone] Phone: [phone]; Fax: [phone]

## 2016-10-20 NOTE — Patient Instructions (Signed)
Medication Instructions:  Your physician recommends that you continue on your current medications as directed. Please refer to the Current Medication list given to you today.   Labwork: TODAY - complete metabolic panel, cholesterol   Testing/Procedures: None Ordered   Follow-Up: Your physician wants you to follow-up in: 6 months with Dr. Nahser.  You will receive a reminder letter in the mail two months in advance. If you don't receive a letter, please call our office to schedule the follow-up appointment.   If you need a refill on your cardiac medications before your next appointment, please call your pharmacy.   Thank you for choosing [facility]! Britton Perkinson, RN [phone]    

## 2016-10-21 ENCOUNTER — Other Ambulatory Visit: Payer: Self-pay | Admitting: Nurse Practitioner

## 2016-10-21 DIAGNOSIS — E782 Mixed hyperlipidemia: Secondary | ICD-10-CM

## 2016-10-24 DIAGNOSIS — K5792 Diverticulitis of intestine, part unspecified, without perforation or abscess without bleeding: Secondary | ICD-10-CM | POA: Diagnosis not present

## 2016-10-24 DIAGNOSIS — R109 Unspecified abdominal pain: Secondary | ICD-10-CM | POA: Diagnosis not present

## 2016-10-30 ENCOUNTER — Ambulatory Visit
Admission: RE | Admit: 2016-10-30 | Discharge: 2016-10-30 | Disposition: A | Discharge status: Home / Self Care | Payer: Medicare Other | Source: Ambulatory Visit | Attending: Family Medicine | Admitting: Family Medicine

## 2016-10-30 ENCOUNTER — Other Ambulatory Visit: Payer: Self-pay | Admitting: Family Medicine

## 2016-10-30 DIAGNOSIS — R1032 Left lower quadrant pain: Secondary | ICD-10-CM | POA: Diagnosis not present

## 2016-10-30 DIAGNOSIS — K760 Fatty (change of) liver, not elsewhere classified: Secondary | ICD-10-CM | POA: Diagnosis not present

## 2016-10-30 DIAGNOSIS — N183 Chronic kidney disease, stage 3 (moderate): Secondary | ICD-10-CM | POA: Diagnosis not present

## 2016-10-30 DIAGNOSIS — K5730 Diverticulosis of large intestine without perforation or abscess without bleeding: Secondary | ICD-10-CM | POA: Diagnosis not present

## 2016-10-30 MED ORDER — IOPAMIDOL (ISOVUE-300) INJECTION 61%
80.0000 mL | Freq: Once | INTRAVENOUS | Status: AC | PRN
  Administered 2016-10-30: 80 mL via INTRAVENOUS

## 2016-11-13 DIAGNOSIS — G4733 Obstructive sleep apnea (adult) (pediatric): Secondary | ICD-10-CM | POA: Diagnosis not present

## 2016-12-25 ENCOUNTER — Ambulatory Visit: Payer: Medicare Other | Admitting: Cardiovascular Disease

## 2017-01-08 ENCOUNTER — Other Ambulatory Visit: Payer: Self-pay | Admitting: Cardiovascular Disease

## 2017-01-12 ENCOUNTER — Other Ambulatory Visit: Payer: Self-pay | Admitting: Cardiovascular Disease

## 2017-01-28 ENCOUNTER — Other Ambulatory Visit: Payer: Medicare Other | Admitting: *Deleted

## 2017-01-28 DIAGNOSIS — E782 Mixed hyperlipidemia: Secondary | ICD-10-CM | POA: Diagnosis not present

## 2017-01-28 LAB — COMPREHENSIVE METABOLIC PANEL
ALBUMIN: 4.4 g/dL (ref 3.5–4.8)
ALK PHOS: 31 IU/L — AB (ref 39–117)
ALT: 36 IU/L (ref 0–44)
AST: 36 IU/L (ref 0–40)
Albumin/Globulin Ratio: 1.4 (ref 1.2–2.2)
BUN / CREAT RATIO: 18 (ref 10–24)
BUN: 24 mg/dL (ref 8–27)
Bilirubin Total: 0.5 mg/dL (ref 0.0–1.2)
CALCIUM: 9.4 mg/dL (ref 8.6–10.2)
CO2: 20 mmol/L (ref 20–29)
CREATININE: 1.34 mg/dL — AB (ref 0.76–1.27)
Chloride: 105 mmol/L (ref 96–106)
GFR, EST AFRICAN AMERICAN: 60 mL/min/{1.73_m2} (ref >59)
GFR, EST NON AFRICAN AMERICAN: 52 mL/min/{1.73_m2} — AB (ref >59)
GLUCOSE: 165 mg/dL — AB (ref 65–99)
Globulin, Total: 3.1 g/dL (ref 1.5–4.5)
Potassium: 4.4 mmol/L (ref 3.5–5.2)
Sodium: 139 mmol/L (ref 134–144)
TOTAL PROTEIN: 7.5 g/dL (ref 6.0–8.5)

## 2017-01-28 LAB — LIPID PANEL
Chol/HDL Ratio: 4.9 ratio (ref 0.0–5.0)
Cholesterol, Total: 136 mg/dL (ref 100–199)
HDL: 28 mg/dL — ABNORMAL LOW (ref >39)
LDL Calculated: 63 mg/dL (ref 0–99)
Triglycerides: 224 mg/dL — ABNORMAL HIGH (ref 0–149)
VLDL Cholesterol Cal: 45 mg/dL — ABNORMAL HIGH (ref 5–40)

## 2017-02-12 DIAGNOSIS — E119 Type 2 diabetes mellitus without complications: Secondary | ICD-10-CM | POA: Diagnosis not present

## 2017-02-12 DIAGNOSIS — N183 Chronic kidney disease, stage 3 (moderate): Secondary | ICD-10-CM | POA: Diagnosis not present

## 2017-02-12 DIAGNOSIS — Z0000 Encounter for general adult medical examination without abnormal findings: Secondary | ICD-10-CM | POA: Diagnosis not present

## 2017-02-12 DIAGNOSIS — E785 Hyperlipidemia, unspecified: Secondary | ICD-10-CM | POA: Diagnosis not present

## 2017-03-08 ENCOUNTER — Other Ambulatory Visit: Payer: Self-pay | Admitting: Cardiovascular Disease

## 2017-03-10 DIAGNOSIS — L570 Actinic keratosis: Secondary | ICD-10-CM | POA: Diagnosis not present

## 2017-03-10 DIAGNOSIS — Z23 Encounter for immunization: Secondary | ICD-10-CM | POA: Diagnosis not present

## 2017-03-10 DIAGNOSIS — L821 Other seborrheic keratosis: Secondary | ICD-10-CM | POA: Diagnosis not present

## 2017-03-10 DIAGNOSIS — D1801 Hemangioma of skin and subcutaneous tissue: Secondary | ICD-10-CM | POA: Diagnosis not present

## 2017-03-10 DIAGNOSIS — Z86018 Personal history of other benign neoplasm: Secondary | ICD-10-CM | POA: Diagnosis not present

## 2017-03-10 DIAGNOSIS — L814 Other melanin hyperpigmentation: Secondary | ICD-10-CM | POA: Diagnosis not present

## 2017-03-10 DIAGNOSIS — Z85828 Personal history of other malignant neoplasm of skin: Secondary | ICD-10-CM | POA: Diagnosis not present

## 2017-03-10 DIAGNOSIS — D225 Melanocytic nevi of trunk: Secondary | ICD-10-CM | POA: Diagnosis not present

## 2017-03-10 DIAGNOSIS — L309 Dermatitis, unspecified: Secondary | ICD-10-CM | POA: Diagnosis not present

## 2017-04-08 DIAGNOSIS — H10502 Unspecified blepharoconjunctivitis, left eye: Secondary | ICD-10-CM | POA: Diagnosis not present

## 2017-04-08 DIAGNOSIS — H01001 Unspecified blepharitis right upper eyelid: Secondary | ICD-10-CM | POA: Diagnosis not present

## 2017-04-08 DIAGNOSIS — H01004 Unspecified blepharitis left upper eyelid: Secondary | ICD-10-CM | POA: Diagnosis not present

## 2017-04-08 DIAGNOSIS — H01002 Unspecified blepharitis right lower eyelid: Secondary | ICD-10-CM | POA: Diagnosis not present

## 2017-04-08 DIAGNOSIS — H01005 Unspecified blepharitis left lower eyelid: Secondary | ICD-10-CM | POA: Diagnosis not present

## 2017-04-09 ENCOUNTER — Telehealth: Payer: Self-pay | Admitting: Cardiovascular Disease

## 2017-04-09 MED ORDER — VALSARTAN 160 MG PO TABS: 160.0000 mg | ORAL_TABLET | Freq: Every day | ORAL | 3 refills | Status: DC

## 2017-04-09 NOTE — Telephone Encounter (Signed)
°  New Prob  Pt c/o medication issue:  1. Name of Medication:  valsartan (DIOVAN) 160 MG tablet  2. How are you currently taking this medication (dosage and times per day)? 1 tablet once daily  3. Are you having a reaction (difficulty breathing--STAT)? Pt reports an intermittent cough  4. What is your medication issue? Has some questions regarding recall and if he should be switched to a different medication

## 2017-04-09 NOTE — Telephone Encounter (Signed)
Patient requesting additional information about valsartan recall. Also received information form [facility] and his medication IS NOT on the recall list.   Patient satisfied with information provided and requesting refill.  Valsartan 160mg  90 day sypply Refill #3  [facility] as requested

## 2017-04-09 NOTE — Telephone Encounter (Signed)
Forwarding valsartan recall inquiry to pharmD to advise.

## 2017-04-10 DIAGNOSIS — C801 Malignant (primary) neoplasm, unspecified: Secondary | ICD-10-CM | POA: Insufficient documentation

## 2017-04-10 DIAGNOSIS — K5792 Diverticulitis of intestine, part unspecified, without perforation or abscess without bleeding: Secondary | ICD-10-CM | POA: Insufficient documentation

## 2017-04-10 DIAGNOSIS — G4730 Sleep apnea, unspecified: Secondary | ICD-10-CM | POA: Insufficient documentation

## 2017-04-10 DIAGNOSIS — E8881 Metabolic syndrome: Secondary | ICD-10-CM | POA: Insufficient documentation

## 2017-04-10 DIAGNOSIS — Z85828 Personal history of other malignant neoplasm of skin: Secondary | ICD-10-CM | POA: Insufficient documentation

## 2017-04-10 DIAGNOSIS — R7303 Prediabetes: Secondary | ICD-10-CM | POA: Insufficient documentation

## 2017-04-10 DIAGNOSIS — I10 Essential (primary) hypertension: Secondary | ICD-10-CM | POA: Insufficient documentation

## 2017-04-10 DIAGNOSIS — K5790 Diverticulosis of intestine, part unspecified, without perforation or abscess without bleeding: Secondary | ICD-10-CM | POA: Insufficient documentation

## 2017-04-10 DIAGNOSIS — M1990 Unspecified osteoarthritis, unspecified site: Secondary | ICD-10-CM | POA: Insufficient documentation

## 2017-04-10 DIAGNOSIS — E88819 Insulin resistance, unspecified: Secondary | ICD-10-CM | POA: Insufficient documentation

## 2017-04-10 DIAGNOSIS — E781 Pure hyperglyceridemia: Secondary | ICD-10-CM | POA: Insufficient documentation

## 2017-04-24 ENCOUNTER — Other Ambulatory Visit: Payer: Self-pay | Admitting: Cardiovascular Disease

## 2017-04-28 ENCOUNTER — Ambulatory Visit: Payer: Medicare Other | Admitting: Cardiovascular Disease

## 2017-05-20 DIAGNOSIS — E119 Type 2 diabetes mellitus without complications: Secondary | ICD-10-CM | POA: Diagnosis not present

## 2017-05-20 DIAGNOSIS — H01001 Unspecified blepharitis right upper eyelid: Secondary | ICD-10-CM | POA: Diagnosis not present

## 2017-05-20 DIAGNOSIS — H01004 Unspecified blepharitis left upper eyelid: Secondary | ICD-10-CM | POA: Diagnosis not present

## 2017-05-20 DIAGNOSIS — H5213 Myopia, bilateral: Secondary | ICD-10-CM | POA: Diagnosis not present

## 2017-05-20 DIAGNOSIS — H2513 Age-related nuclear cataract, bilateral: Secondary | ICD-10-CM | POA: Diagnosis not present

## 2017-05-20 DIAGNOSIS — H01002 Unspecified blepharitis right lower eyelid: Secondary | ICD-10-CM | POA: Diagnosis not present

## 2017-05-20 DIAGNOSIS — H35033 Hypertensive retinopathy, bilateral: Secondary | ICD-10-CM | POA: Diagnosis not present

## 2017-06-15 ENCOUNTER — Encounter: Payer: Self-pay | Admitting: Cardiovascular Disease

## 2017-06-15 ENCOUNTER — Ambulatory Visit (INDEPENDENT_AMBULATORY_CARE_PROVIDER_SITE_OTHER): Payer: Medicare Other | Admitting: Cardiovascular Disease

## 2017-06-15 VITALS — BP 120/76 | HR 74 | Ht 72.0 in | Wt 218.8 lb

## 2017-06-15 DIAGNOSIS — E785 Hyperlipidemia, unspecified: Secondary | ICD-10-CM | POA: Diagnosis not present

## 2017-06-15 DIAGNOSIS — I10 Essential (primary) hypertension: Secondary | ICD-10-CM

## 2017-06-15 NOTE — Progress Notes (Signed)
[name] Date of Birth  01/09/1941             1. Hyperlipidemia 2.Hypertension  Notes prior to 2014:   Camila Li seems to be doing well.  He continues to have elevations in his triglyceride levels.  He is tolerating the crestor now.    Sept. 15, 2014:   Still active flying, not exercising as much as he should.  No CP or dyspnea.  Sept. 24, 2015:  Camila Li is doing well.  Has lost 10 lbs since  I last saw him.  Exercising more.  HbA1C  Has improved.   He is no longer flying because he is on Lamactil.    Sept. 26, 2016:     Doing well from a cardiac standpoint On Flomax for BPH.  No cardiac issues.   Sept. 19, 2017  Doing well from a cardiac standpoint. Had some foot issues.   Was in a boot for several months  Now has orthotic arch supports in his shoes.  Glucose has been slightly elevated.    October 20, 2016:  Camila Li is seen today for follow up .  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 25, 2019:  No longer is flying . He is off the meds that prevented him from passing his medical  No CP or dyspnea Knows that he needs to exercise more His medical doctor is following his DM and lipids   Current Outpatient Medications on File Prior to Visit  Medication Sig Dispense Refill  . Ascorbic Acid (VITAMIN C PO) Take 3 g by mouth every morning.     Marland Kitchen aspirin 81 MG tablet Take 81 mg by mouth every evening.     . Berberine Chloride POWD Take 800 mg by mouth every morning.     . Lecithin 1200 MG CAPS Take 2 capsules by mouth every morning.    . metoprolol tartrate (LOPRESSOR) 25 MG tablet TAKE 1 TABLET TWICE A DAY 180 tablet 3  . mometasone (NASONEX) 50 MCG/ACT nasal spray Place 2 sprays into the nose daily as needed (allergies).    Marland Kitchen omega-3 acid ethyl esters (LOVAZA) 1 g capsule Take 3 capsules (3 g total) daily by mouth. 270 capsule 2  . OVER THE COUNTER MEDICATION Take 2 tablets by mouth every morning. [place]    .  TRICOR 145 MG tablet TAKE 1 TABLET EVERY EVENING 90 tablet 1  . Ubiquinol 200 MG CAPS Take 400 mg by mouth every morning.    . valsartan (DIOVAN) 160 MG tablet Take 1 tablet (160 mg total) by mouth daily. 90 tablet 3  . [DISCONTINUED] irbesartan (AVAPRO) 150 MG tablet Take 1 tablet (150 mg total) by mouth daily. 90 tablet 3   No current facility-administered medications on file prior to visit.     Allergies  Allergen Reactions  . Lisinopril Cough    Causes a cough    Past Medical History:  Diagnosis Date  . Arthritis   . Borderline diabetes    no meds / elevated HgA1C  . Cancer ([place])    skin pre cancer nose  . Cough May 2016   new x59mo, denies fever and shortness of breath  . Diverticulitis   . Diverticulosis   . Erectile dysfunction   . Hemorrhoids   . History of skin cancer    basal cell removed left arm 09/07/14  . Hx of colonic polyps   .  Hyperlipidemia   . Hypertension    orthostatic  . Hypertriglyceridemia   . Insulin resistance   . Rotator cuff tear    left  . Sleep apnea    recently diagnosed / has not started using c-pap yet  . 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 Vitamin D deficiency     Past Surgical History:  Procedure Laterality Date  . COLONOSCOPY W/ BIOPSIES AND POLYPECTOMY  multiple, 2005, 08, 14  . [place]   right / for [place]  . NASAL SINUS SURGERY    . RECTAL BIOPSY    . SHOULDER OPEN ROTATOR CUFF REPAIR Left 09/14/2014   Procedure: LEFT SHOULDER MINI OPEN ROTATOR CUFF REPAIR AND SUBACROMIAL DECOMPRESSION ;  Surgeon: Susa Day, MD;  Location: [facility];  Service: Orthopedics;  Laterality: Left;    Social History   Tobacco Use  Smoking Status Never Smoker  Smokeless Tobacco Never Used    Social History   Substance and Sexual Activity  Alcohol Use Yes   Comment: rarely    Family History  Problem Relation Age of Onset  . Colon cancer Neg Hx     Reviw of Systems:  Reviewed in the HPI.  All other  systems are negative.  Physical Exam: Blood pressure 120/76, pulse 74, height 6' (1.829 m), weight 218 lb 12.8 oz (99.2 kg), SpO2 96 %.  GEN:  Well nourished, well developed in no acute distress HEENT: Normal NECK: No JVD; No carotid bruits LYMPHATICS: No lymphadenopathy CARDIAC: RRR  no murmurs, rubs, gallops RESPIRATORY:  Clear to auscultation without rales, wheezing or rhonchi  ABDOMEN: Soft, non-tender, non-distended MUSCULOSKELETAL:  No edema; No deformity  SKIN: Warm and dry NEUROLOGIC:  Alert and oriented x 3   ECG:  Assessment / Plan:   1. Hyperlipidemia-      Lipids have been managed by his primary MD   2.Hypertension-   BP is well controlled.  I have advised him to work on a better exercise program which will help with his hyperlipidemia, hypertension and his elevated glucose levels.  Will also help with some weight loss.  Will see him in 1 year    Mertie Moores, MD  06/15/2017 9:57 AM    [address],  [place], [place]  [address] Pager [phone] Phone: [phone]; Fax: [phone]

## 2017-06-15 NOTE — Patient Instructions (Signed)
Medication Instructions:  Your physician recommends that you continue on your current medications as directed. Please refer to the Current Medication list given to you today.   Labwork: None ordered  Testing/Procedures: None ordered  Follow-Up: Your physician wants you to follow-up in: 1 year with Dr. Nahser. You will receive a reminder letter in the mail two months in advance. If you don't receive a letter, please call our office to schedule the follow-up appointment.   Any Other Special Instructions Will Be Listed Below (If Applicable).     If you need a refill on your cardiac medications before your next appointment, please call your pharmacy. \  

## 2017-07-28 DIAGNOSIS — L821 Other seborrheic keratosis: Secondary | ICD-10-CM | POA: Diagnosis not present

## 2017-07-28 DIAGNOSIS — D225 Melanocytic nevi of trunk: Secondary | ICD-10-CM | POA: Diagnosis not present

## 2017-07-28 DIAGNOSIS — L570 Actinic keratosis: Secondary | ICD-10-CM | POA: Diagnosis not present

## 2017-08-18 DIAGNOSIS — J309 Allergic rhinitis, unspecified: Secondary | ICD-10-CM | POA: Diagnosis not present

## 2017-08-18 DIAGNOSIS — N183 Chronic kidney disease, stage 3 (moderate): Secondary | ICD-10-CM | POA: Diagnosis not present

## 2017-08-18 DIAGNOSIS — I10 Essential (primary) hypertension: Secondary | ICD-10-CM | POA: Diagnosis not present

## 2017-08-18 DIAGNOSIS — E119 Type 2 diabetes mellitus without complications: Secondary | ICD-10-CM | POA: Diagnosis not present

## 2017-08-18 DIAGNOSIS — E785 Hyperlipidemia, unspecified: Secondary | ICD-10-CM | POA: Diagnosis not present

## 2017-11-12 DIAGNOSIS — G4733 Obstructive sleep apnea (adult) (pediatric): Secondary | ICD-10-CM | POA: Diagnosis not present

## 2017-11-22 ENCOUNTER — Other Ambulatory Visit: Payer: Self-pay | Admitting: Cardiovascular Disease

## 2017-12-07 ENCOUNTER — Encounter: Payer: Self-pay | Admitting: Internal Medicine

## 2017-12-25 ENCOUNTER — Telehealth: Payer: Self-pay | Admitting: Neurology

## 2017-12-25 MED ORDER — TOPIRAMATE 50 MG PO TABS: 50.0000 mg | ORAL_TABLET | Freq: Two times a day (BID) | ORAL | 1 refills | Status: DC

## 2017-12-25 NOTE — Addendum Note (Signed)
Addended by: Kathrynn Ducking on: 12/25/2017 01:57 PM   Modules accepted: Orders

## 2017-12-25 NOTE — Telephone Encounter (Signed)
Pt has called stating he is possibly having SUNCT flare up which is painful.  Pt has accepted 1st available appointment and is on wait list.  Pt is asking is something can be called in for him while he waits.  Pt is also asking RN to call if there is a cancellation due to pain. Pt using   [facility] [place] - [address] [phone] (Phone) [phone] (Fax)    as his pharmacy

## 2017-12-25 NOTE — Telephone Encounter (Signed)
I called pt back. Scheduled earlier f/u for 12/29/17 at 730am, check in 7am.  CX appt that was made for Decemeber.  He was last seen here 08/2016. Advised I will have to send message to Dr. Jannifer Franklin to see if he can send in rx for him.  He advised he has left over topamax and lamotrigine.

## 2017-12-25 NOTE — Telephone Encounter (Signed)
The left forehead shock sensations have returned, he did well on Topamax previously, we will restart taking 50 mg twice daily, eventually we may need to get back to the 150 mg dosing twice daily.  He will be seen in the office next week.

## 2017-12-29 ENCOUNTER — Ambulatory Visit (INDEPENDENT_AMBULATORY_CARE_PROVIDER_SITE_OTHER): Payer: Medicare Other | Admitting: Neurology

## 2017-12-29 ENCOUNTER — Encounter: Payer: Self-pay | Admitting: Neurology

## 2017-12-29 VITALS — BP 137/84 | HR 71 | Ht 72.0 in | Wt 214.0 lb

## 2017-12-29 DIAGNOSIS — G44059 Short lasting unilateral neuralgiform headache with conjunctival injection and tearing (SUNCT), not intractable: Secondary | ICD-10-CM

## 2017-12-29 MED ORDER — LAMOTRIGINE 25 MG PO TABS: ORAL_TABLET | ORAL | 3 refills | Status: DC

## 2017-12-29 NOTE — Progress Notes (Signed)
Reason for visit: SUNCT headache  Christian Ross is an 77 y.o. 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  The patient had done quite well off all medications, but he had a return of his pain within the last 5 days.  The patient is now back on Topamax taking 50 mg twice daily, he on his own has started on 100 mg daily of Lamictal.  Even within the last 5 days he has had a significant improvement in his pain syndrome.  He is now only having occasional twinges of pain.  The patient so far tolerating medication well.  The patient returns to this office for an evaluation.  The combination of Topamax and Lamictal in the past have been beneficial.  Past Medical History:  Diagnosis Date  . Arthritis   . Borderline diabetes    no meds / elevated HgA1C  . Cancer ([place])    skin pre cancer nose  . Cough May 2016   new x8mo, denies fever and shortness of breath  . Diverticulitis   . Diverticulosis   . Erectile dysfunction   . Hemorrhoids   . History of skin cancer    basal cell removed left arm 09/07/14  . Hx of colonic polyps   . Hyperlipidemia   . Hypertension    orthostatic  . Hypertriglyceridemia   . Insulin resistance   . Rotator cuff tear    left  . Sleep apnea    recently diagnosed / has not started using c-pap yet  . 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 Vitamin D deficiency     Past Surgical History:  Procedure Laterality Date  . COLONOSCOPY W/ BIOPSIES AND POLYPECTOMY  multiple, 2005, 08, 14  . [place]   right / for [place]  . NASAL SINUS SURGERY    . RECTAL BIOPSY    . SHOULDER OPEN ROTATOR CUFF REPAIR Left 09/14/2014   Procedure: LEFT SHOULDER MINI OPEN ROTATOR CUFF REPAIR AND SUBACROMIAL DECOMPRESSION ;  Surgeon: Susa Day, MD;  Location: [facility];  Service: Orthopedics;  Laterality: Left;    Family  History  Problem Relation Age of Onset  . Colon cancer Neg Hx     Social history:  reports that he has never smoked. He has never used smokeless tobacco. He reports that he drinks alcohol. He reports that he does not use drugs.    Allergies  Allergen Reactions  . Lisinopril Cough    Causes a cough    Medications:  Prior to Admission medications   Medication Sig Start Date End Date Taking? Authorizing Provider  Ascorbic Acid (VITAMIN C PO) Take 3 g by mouth every morning.    Yes [provider]  aspirin 81 MG tablet Take 81 mg by mouth every evening.    Yes [provider]  Berberine Chloride POWD Take 800 mg by mouth every morning.    Yes [provider]  glipiZIDE (GLUCOTROL XL) 5 MG 24 hr tablet Take 5 mg by mouth daily. 11/03/17  Yes [provider]  Lecithin 1200 MG CAPS Take 2 capsules by mouth every morning.   Yes [provider]  metoprolol tartrate (LOPRESSOR) 25 MG tablet TAKE 1 TABLET TWICE A DAY 01/12/17  Yes Nahser, Wonda Cheng, MD  mometasone (NASONEX) 50 MCG/ACT nasal spray Place 2 sprays into  the nose daily as needed (allergies).   Yes [provider]  omega-3 acid ethyl esters (LOVAZA) 1 g capsule Take 3 capsules (3 g total) daily by mouth. 03/09/17  Yes Nahser, Wonda Cheng, MD  OVER THE COUNTER MEDICATION Take 2 tablets by mouth every morning. [place]   Yes [provider]  Saw Palmetto, Serenoa repens, (SAW PALMETTO PO) Take by mouth.   Yes [provider]  topiramate (TOPAMAX) 50 MG tablet Take 1 tablet (50 mg total) by mouth 2 (two) times daily. 12/25/17  Yes Kathrynn Ducking, MD  TRICOR 145 MG tablet TAKE 1 TABLET EVERY EVENING 11/23/17  Yes Nahser, Wonda Cheng, MD  TURMERIC PO Take by mouth.   Yes [provider]  Ubiquinol 200 MG CAPS Take 400 mg by mouth every morning.   Yes [provider]  valsartan (DIOVAN) 160 MG tablet Take 1 tablet (160 mg total) by mouth daily. 04/09/17  Yes  Nahser, Wonda Cheng, MD  lamoTRIgine (LAMICTAL) 25 MG tablet One tablet twice a day for 2 weeks, then take 2 tablets twice a day for 2 weeks, then take 3 tablets twice a day 12/29/17   Kathrynn Ducking, MD  irbesartan (AVAPRO) 150 MG tablet Take 1 tablet (150 mg total) by mouth daily. 06/24/11 06/26/11  Nahser, Wonda Cheng, MD    ROS:  Out of a complete 14 system review of symptoms, the patient complains only of the following symptoms, and all other reviewed systems are negative.  Headache  Blood pressure 137/84, pulse 71, height 6' (1.829 m), weight 214 lb (97.1 kg).  Physical Exam  General: The patient is alert and cooperative at the time of the examination.  Skin: No significant peripheral edema is noted.   Neurologic Exam  Mental status: The patient is alert and oriented x 3 at the time of the examination. The patient has apparent normal recent and remote memory, with an apparently normal attention span and concentration ability.   Cranial nerves: Facial symmetry is present. Speech is normal, no aphasia or dysarthria is noted. Extraocular movements are full. Visual fields are full.  Motor: The patient has good strength in all 4 extremities.  Sensory examination: Soft touch sensation is symmetric on the face, arms, and legs.  Coordination: The patient has good finger-nose-finger and heel-to-shin bilaterally.  Gait and station: The patient has a normal gait. Tandem gait is normal. Romberg is negative. No drift is seen.  Reflexes: Deep tendon reflexes are symmetric.   Assessment/Plan:  1. SUNCT headache  The patient comes in on an urgent basis for recurrence of his headache.  The patient is having sharp jabbing electric shock pains behind the left eye and left brow.  The patient has already had a response to Topamax taking 50 mg twice daily, we will restart the Lamictal taking 25 mg twice daily, going up by 50 mg every 2 weeks until he gets to 100 mg twice daily.  He has started the  100 mg tablet, but I am afraid that this will cause a skin rash if he continues the medication.  The patient will follow-up in 4 months.  If needed, the Topamax dose can also be increased.  He will call if he continues to have twinges of pain.  Jill Alexanders MD 12/29/2017 7:45 AM  [facility] [address] [place],  [address]  Phone [phone] Fax [phone]

## 2018-01-07 ENCOUNTER — Other Ambulatory Visit: Payer: Self-pay | Admitting: Cardiovascular Disease

## 2018-02-09 DIAGNOSIS — H01002 Unspecified blepharitis right lower eyelid: Secondary | ICD-10-CM | POA: Diagnosis not present

## 2018-02-09 DIAGNOSIS — H01004 Unspecified blepharitis left upper eyelid: Secondary | ICD-10-CM | POA: Diagnosis not present

## 2018-02-09 DIAGNOSIS — H01001 Unspecified blepharitis right upper eyelid: Secondary | ICD-10-CM | POA: Diagnosis not present

## 2018-02-09 DIAGNOSIS — H01005 Unspecified blepharitis left lower eyelid: Secondary | ICD-10-CM | POA: Diagnosis not present

## 2018-02-23 ENCOUNTER — Telehealth: Payer: Self-pay | Admitting: Neurology

## 2018-02-23 MED ORDER — TOPIRAMATE 50 MG PO TABS: 50.0000 mg | ORAL_TABLET | Freq: Two times a day (BID) | ORAL | 1 refills | Status: DC

## 2018-02-23 NOTE — Telephone Encounter (Signed)
Patient requesting new Rx for topiramate (TOPAMAX) 50 MG tablet #180 sent to [facility] on [address]. This is a new pharmacy.

## 2018-02-23 NOTE — Addendum Note (Signed)
Addended by: France Ravens I on: 02/23/2018 10:38 AM   Modules accepted: Orders

## 2018-02-23 NOTE — Telephone Encounter (Signed)
Pt. is current with appt's. Topamax escribed to [facility] as requested/fim

## 2018-03-09 DIAGNOSIS — L570 Actinic keratosis: Secondary | ICD-10-CM | POA: Diagnosis not present

## 2018-03-09 DIAGNOSIS — L821 Other seborrheic keratosis: Secondary | ICD-10-CM | POA: Diagnosis not present

## 2018-03-09 DIAGNOSIS — L814 Other melanin hyperpigmentation: Secondary | ICD-10-CM | POA: Diagnosis not present

## 2018-03-09 DIAGNOSIS — Z23 Encounter for immunization: Secondary | ICD-10-CM | POA: Diagnosis not present

## 2018-03-09 DIAGNOSIS — Z86018 Personal history of other benign neoplasm: Secondary | ICD-10-CM | POA: Diagnosis not present

## 2018-03-09 DIAGNOSIS — Z85828 Personal history of other malignant neoplasm of skin: Secondary | ICD-10-CM | POA: Diagnosis not present

## 2018-03-09 DIAGNOSIS — D225 Melanocytic nevi of trunk: Secondary | ICD-10-CM | POA: Diagnosis not present

## 2018-04-19 ENCOUNTER — Encounter

## 2018-04-19 ENCOUNTER — Ambulatory Visit: Payer: Medicare Other | Admitting: Neurology

## 2018-04-30 ENCOUNTER — Encounter: Payer: Self-pay | Admitting: Internal Medicine

## 2018-05-07 DIAGNOSIS — E1169 Type 2 diabetes mellitus with other specified complication: Secondary | ICD-10-CM | POA: Diagnosis not present

## 2018-05-07 DIAGNOSIS — G44059 Short lasting unilateral neuralgiform headache with conjunctival injection and tearing (SUNCT), not intractable: Secondary | ICD-10-CM | POA: Diagnosis not present

## 2018-05-07 DIAGNOSIS — Z23 Encounter for immunization: Secondary | ICD-10-CM | POA: Diagnosis not present

## 2018-05-07 DIAGNOSIS — E119 Type 2 diabetes mellitus without complications: Secondary | ICD-10-CM | POA: Diagnosis not present

## 2018-05-07 DIAGNOSIS — N183 Chronic kidney disease, stage 3 (moderate): Secondary | ICD-10-CM | POA: Diagnosis not present

## 2018-05-07 DIAGNOSIS — G4733 Obstructive sleep apnea (adult) (pediatric): Secondary | ICD-10-CM | POA: Diagnosis not present

## 2018-05-07 DIAGNOSIS — E785 Hyperlipidemia, unspecified: Secondary | ICD-10-CM | POA: Diagnosis not present

## 2018-05-07 DIAGNOSIS — Z0000 Encounter for general adult medical examination without abnormal findings: Secondary | ICD-10-CM | POA: Diagnosis not present

## 2018-05-13 ENCOUNTER — Encounter: Payer: Self-pay | Admitting: Neurology

## 2018-05-13 ENCOUNTER — Ambulatory Visit (INDEPENDENT_AMBULATORY_CARE_PROVIDER_SITE_OTHER): Payer: Medicare Other | Admitting: Neurology

## 2018-05-13 VITALS — BP 136/76 | HR 72 | Ht 72.0 in | Wt 223.0 lb

## 2018-05-13 DIAGNOSIS — G44059 Short lasting unilateral neuralgiform headache with conjunctival injection and tearing (SUNCT), not intractable: Secondary | ICD-10-CM

## 2018-05-13 MED ORDER — TOPIRAMATE 50 MG PO TABS: 50.0000 mg | ORAL_TABLET | Freq: Two times a day (BID) | ORAL | 3 refills | Status: DC

## 2018-05-13 NOTE — Progress Notes (Signed)
Reason for visit: SUNCT headache  Christian HAMMERS is an 78 y.o. male  History of present illness:  Christian Ross is a 78 year old right-handed white male with a history of SUNCT headaches.  The patient started getting recurrence of his headaches when last seen in September 2019.  He was immediately placed back on Topamax and Lamictal, his headache duration is only about 1 week.  The patient has done quite well on the medications, he has been taking 50 mg twice daily of Topamax and he is on 75 mg twice daily of the Lamictal.  He has not had any twinges of pain.  He returns for an evaluation.  Past Medical History:  Diagnosis Date  . Arthritis   . Borderline diabetes    no meds / elevated HgA1C  . Cancer ([place])    skin pre cancer nose  . Cough May 2016   new x53mo, denies fever and shortness of breath  . Diverticulitis   . Diverticulosis   . Erectile dysfunction   . Hemorrhoids   . History of skin cancer    basal cell removed left arm 09/07/14  . Hx of colonic polyps   . Hyperlipidemia   . Hypertension    orthostatic  . Hypertriglyceridemia   . Insulin resistance   . Rotator cuff tear    left  . Sleep apnea    recently diagnosed / has not started using c-pap yet  . 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 Vitamin D deficiency     Past Surgical History:  Procedure Laterality Date  . COLONOSCOPY W/ BIOPSIES AND POLYPECTOMY  multiple, 2005, 08, 14  . [place]   right / for [place]  . NASAL SINUS SURGERY    . RECTAL BIOPSY    . SHOULDER OPEN ROTATOR CUFF REPAIR Left 09/14/2014   Procedure: LEFT SHOULDER MINI OPEN ROTATOR CUFF REPAIR AND SUBACROMIAL DECOMPRESSION ;  Surgeon: Susa Day, MD;  Location: [facility];  Service: Orthopedics;  Laterality: Left;    Family History  Problem Relation Age of Onset  . Colon cancer Neg Hx     Social history:  reports that he has never smoked. He has never used smokeless tobacco. He reports current  alcohol use. He reports that he does not use drugs.    Allergies  Allergen Reactions  . Lisinopril Cough    Causes a cough    Medications:  Prior to Admission medications   Medication Sig Start Date End Date Taking? Authorizing Provider  Ascorbic Acid (VITAMIN C PO) Take 3 g by mouth every morning.    Yes [provider]  aspirin 81 MG tablet Take 81 mg by mouth every evening.    Yes [provider]  Berberine Chloride POWD Take 800 mg by mouth every morning.    Yes [provider]  glipiZIDE (GLUCOTROL XL) 5 MG 24 hr tablet Take 5 mg by mouth daily. 11/03/17  Yes [provider]  lamoTRIgine (LAMICTAL) 25 MG tablet One tablet twice a day for 2 weeks, then take 2 tablets twice a day for 2 weeks, then take 3 tablets twice a day 12/29/17  Yes Kathrynn Ducking, MD  Lecithin 1200 MG CAPS Take 2 capsules by mouth every morning.   Yes [provider]  metoprolol tartrate (LOPRESSOR) 25 MG tablet TAKE 1 TABLET TWICE A DAY 01/07/18  Yes Nahser, Wonda Cheng, MD  mometasone (NASONEX) 50 MCG/ACT nasal spray Place  2 sprays into the nose daily as needed (allergies).   Yes [provider]  omega-3 acid ethyl esters (LOVAZA) 1 g capsule Take 3 capsules (3 g total) daily by mouth. 03/09/17  Yes Nahser, Wonda Cheng, MD  OVER THE COUNTER MEDICATION Take 2 tablets by mouth every morning. [place]   Yes [provider]  Saw Palmetto, Serenoa repens, (SAW PALMETTO PO) Take by mouth.   Yes [provider]  topiramate (TOPAMAX) 50 MG tablet Take 1 tablet (50 mg total) by mouth 2 (two) times daily. 02/23/18  Yes Kathrynn Ducking, MD  TRICOR 145 MG tablet TAKE 1 TABLET EVERY EVENING 11/23/17  Yes Nahser, Wonda Cheng, MD  TURMERIC PO Take by mouth.   Yes [provider]  Ubiquinol 200 MG CAPS Take 400 mg by mouth every morning.   Yes [provider]  valsartan (DIOVAN) 160 MG tablet Take 1 tablet (160 mg total) by mouth daily. 04/09/17   Yes Nahser, Wonda Cheng, MD  irbesartan (AVAPRO) 150 MG tablet Take 1 tablet (150 mg total) by mouth daily. 06/24/11 06/26/11  Nahser, Wonda Cheng, MD    ROS:  Out of a complete 14 system review of symptoms, the patient complains only of the following symptoms, and all other reviewed systems are negative.  Runny nose Sleep apnea on CPAP  Blood pressure 136/76, pulse 72, height 6' (1.829 m), weight 223 lb (101.2 kg).  Physical Exam  General: The patient is alert and cooperative at the time of the examination.  Skin: No significant peripheral edema is noted.   Neurologic Exam  Mental status: The patient is alert and oriented x 3 at the time of the examination. The patient has apparent normal recent and remote memory, with an apparently normal attention span and concentration ability.   Cranial nerves: Facial symmetry is present. Speech is normal, no aphasia or dysarthria is noted. Extraocular movements are full. Visual fields are full.  Motor: The patient has good strength in all 4 extremities.  Sensory examination: Soft touch sensation is symmetric on the face, arms, and legs.  Coordination: The patient has good finger-nose-finger and heel-to-shin bilaterally.  Gait and station: The patient has a normal gait. Tandem gait is normal. Romberg is negative. No drift is seen.  Reflexes: Deep tendon reflexes are symmetric.   Assessment/Plan:  1. SUNCT headache  The patient has done quite well on medications, he will taper down on the Lamictal going to 50 mg twice daily for 2 weeks and then go to 25 mg twice daily.  If he continues to do well for another 3 to 4 weeks, he may stop the Lamictal altogether, he will remain on Topamax.  A prescription for Topamax was sent in.  Jill Alexanders MD 05/13/2018 7:59 AM  [facility] [address] [place], [place] [address]  Phone [phone] Fax [phone]

## 2018-05-13 NOTE — Patient Instructions (Signed)
Continue the Topamax 50 mg twice a day.  Reduce the Lamictal to 50 mg twice a day for 2 weeks, then go to 25 mg twice a day.  If you continue to do well for several weeks on the lower dose of the Lamictal, you may discontinue the medication.

## 2018-05-19 DIAGNOSIS — H524 Presbyopia: Secondary | ICD-10-CM | POA: Diagnosis not present

## 2018-05-19 DIAGNOSIS — H01001 Unspecified blepharitis right upper eyelid: Secondary | ICD-10-CM | POA: Diagnosis not present

## 2018-05-19 DIAGNOSIS — H2513 Age-related nuclear cataract, bilateral: Secondary | ICD-10-CM | POA: Diagnosis not present

## 2018-05-19 DIAGNOSIS — H01002 Unspecified blepharitis right lower eyelid: Secondary | ICD-10-CM | POA: Diagnosis not present

## 2018-05-19 DIAGNOSIS — H52223 Regular astigmatism, bilateral: Secondary | ICD-10-CM | POA: Diagnosis not present

## 2018-05-19 DIAGNOSIS — H01004 Unspecified blepharitis left upper eyelid: Secondary | ICD-10-CM | POA: Diagnosis not present

## 2018-06-02 ENCOUNTER — Other Ambulatory Visit: Payer: Self-pay

## 2018-06-02 ENCOUNTER — Ambulatory Visit (AMBULATORY_SURGERY_CENTER): Payer: Self-pay | Admitting: *Deleted

## 2018-06-02 VITALS — Ht 71.5 in | Wt 218.8 lb

## 2018-06-02 DIAGNOSIS — Z86010 Personal history of colonic polyps: Secondary | ICD-10-CM

## 2018-06-02 NOTE — Progress Notes (Signed)
No egg or soy allergy known to patient  No issues with past sedation with any surgeries  or procedures, no intubation problems  No diet pills per patient No home 02 use per patient  No blood thinners per patient  Pt denies issues with constipation  No A fib or A flutter  EMMI video offered and declined today

## 2018-06-03 ENCOUNTER — Telehealth: Payer: Self-pay | Admitting: Neurology

## 2018-06-03 MED ORDER — LAMOTRIGINE 25 MG PO TABS: ORAL_TABLET | ORAL | 0 refills | Status: DC

## 2018-06-03 NOTE — Telephone Encounter (Signed)
Pt is asking for a 90 day refill on his  lamoTRIgine (LAMICTAL) 25 MG tablet  [facility]

## 2018-06-03 NOTE — Telephone Encounter (Signed)
Reviewed chart and medication refill is appropriate. Per Dr. Jannifer Franklin last o/v (05/13/18) Pt is in the tapering off process for the Lamictal.  Pt will decrease Lamictal to 50 mg twice daily for 2 weeks then decrease to 25 mg twice daily. If pt does well on this dosage for several weeks Dr. Jannifer Franklin has advised he can d/c.

## 2018-06-03 NOTE — Addendum Note (Signed)
Addended by: Verlin Grills T on: 06/03/2018 11:45 AM   Modules accepted: Orders

## 2018-06-07 MED ORDER — LAMOTRIGINE 25 MG PO TABS: ORAL_TABLET | ORAL | 0 refills | Status: DC

## 2018-06-07 NOTE — Telephone Encounter (Signed)
Pt has called to inform that the lamoTRIgine (LAMICTAL) 25 MG tablet will not ship until later this week. Pt is asking if some pills can be called into  [id number] While he waits for order from [facility]

## 2018-06-07 NOTE — Addendum Note (Signed)
Addended by: Verlin Grills T on: 06/07/2018 08:59 AM   Modules accepted: Orders

## 2018-06-07 NOTE — Telephone Encounter (Signed)
2 week supply submitted to [facility]. Pt notified and verbalized understanding/apprectiation.

## 2018-06-07 NOTE — Telephone Encounter (Signed)
See message.

## 2018-06-14 ENCOUNTER — Telehealth: Payer: Self-pay | Admitting: *Deleted

## 2018-06-14 NOTE — Telephone Encounter (Signed)
Patient called to correct instructions for Prep. Patient informed to start morning of prep at 4 am and meds by 6 am. Patient aware to be npo after 6. All other instructions reviewed as the same including medications. Patient verbalized understanding.

## 2018-06-15 ENCOUNTER — Telehealth: Payer: Self-pay

## 2018-06-15 ENCOUNTER — Ambulatory Visit (INDEPENDENT_AMBULATORY_CARE_PROVIDER_SITE_OTHER): Payer: Medicare Other | Admitting: Cardiovascular Disease

## 2018-06-15 ENCOUNTER — Encounter: Payer: Self-pay | Admitting: Cardiovascular Disease

## 2018-06-15 VITALS — BP 120/84 | HR 59 | Ht 71.5 in | Wt 221.8 lb

## 2018-06-15 DIAGNOSIS — I10 Essential (primary) hypertension: Secondary | ICD-10-CM

## 2018-06-15 DIAGNOSIS — E781 Pure hyperglyceridemia: Secondary | ICD-10-CM | POA: Diagnosis not present

## 2018-06-15 MED ORDER — OLMESARTAN MEDOXOMIL 20 MG PO TABS: 20.0000 mg | ORAL_TABLET | Freq: Every day | ORAL | 3 refills | Status: DC

## 2018-06-15 MED ORDER — ICOSAPENT ETHYL 1 G PO CAPS: 2.0000 g | ORAL_CAPSULE | Freq: Two times a day (BID) | ORAL | 3 refills | Status: DC

## 2018-06-15 NOTE — Progress Notes (Signed)
Christian Ross Date of Birth  1940/11/11             1. Hyperlipidemia 2.Hypertension  Notes prior to 2014:   Christian Ross seems to be doing well.  He continues to have elevations in his triglyceride levels.  He is tolerating the crestor now.    Sept. 15, 2014:   Still active flying, not exercising as much as he should.  No CP or dyspnea.  Sept. 24, 2015:  Christian Ross is doing well.  Has lost 10 lbs since  I last saw him.  Exercising more.  HbA1C  Has improved.   He is no longer flying because he is on Lamactil.    Sept. 26, 2016:     Doing well from a cardiac standpoint On Flomax for BPH.  No cardiac issues.   Sept. 19, 2017  Doing well from a cardiac standpoint. Had some foot issues.   Was in a boot for several months  Now has orthotic arch supports in his shoes.  Glucose has been slightly elevated.    October 20, 2016:  Christian Ross is seen today for follow up .  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 25, 2019:  No longer is flying . He is off the meds that prevented him from passing his medical  No CP or dyspnea Knows that he needs to exercise more His medical doctor is following his DM and lipids   Feb. 25, 2020: He was seen today for follow-up of his chest pain and hyperlipidemia. Has a question about Vascepa  He had recent labs drawn drawn at his primary medical office.  His last cholesterol was 213.  His HDL is 31.  The LDL is 125.  The triglyceride level is 281.  Open A1c is 7.7.  Hemoglobin is 13.2.  TSH is 3.79. Still eating lots of carbs and fats Is not walking   Current Outpatient Medications on File Prior to Visit  Medication Sig Dispense Refill  . Ascorbic Acid (VITAMIN C PO) Take 3 g by mouth every morning.     Marland Kitchen aspirin 81 MG tablet Take 81 mg by mouth every evening.     . Berberine Chloride POWD Take 800 mg by mouth every morning.     Marland Kitchen glipiZIDE (GLUCOTROL XL) 5 MG 24 hr tablet Take 5 mg by mouth daily.      Marland Kitchen lamoTRIgine (LAMICTAL) 25 MG tablet Take 2 tablets twice a day 56 tablet 0  . Lecithin 1200 MG CAPS Take 2 capsules by mouth every morning.    . metoprolol tartrate (LOPRESSOR) 25 MG tablet TAKE 1 TABLET TWICE A DAY 180 tablet 1  . mometasone (NASONEX) 50 MCG/ACT nasal spray Place 2 sprays into the nose daily as needed (allergies).    Marland Kitchen omega-3 acid ethyl esters (LOVAZA) 1 g capsule Take 3 capsules (3 g total) daily by mouth. 270 capsule 2  . OVER THE COUNTER MEDICATION Take 2 tablets by mouth every morning. [place]    . Saw Palmetto, Serenoa repens, (SAW PALMETTO PO) Take by mouth.    . topiramate (TOPAMAX) 50 MG tablet Take 1 tablet (50 mg total) by mouth 2 (two) times daily. 180 tablet 3  . TRICOR 145 MG tablet TAKE 1 TABLET EVERY EVENING 90 tablet 2  . TURMERIC PO Take by mouth.    . Ubiquinol 200 MG CAPS Take 400 mg by mouth every morning.    Marland Kitchen  valsartan (DIOVAN) 160 MG tablet Take 1 tablet (160 mg total) by mouth daily. 90 tablet 3  . [DISCONTINUED] irbesartan (AVAPRO) 150 MG tablet Take 1 tablet (150 mg total) by mouth daily. 90 tablet 3   No current facility-administered medications on file prior to visit.     Allergies  Allergen Reactions  . Lisinopril Cough    Causes a cough    Past Medical History:  Diagnosis Date  . Allergy   . Arthritis   . Borderline diabetes    no meds / elevated HgA1C  . Cancer ([place])    skin pre cancer nose  . Cough May 2016   new x46mo, denies fever and shortness of breath  . Diabetes mellitus without complication ([place])   . Diverticulitis   . Diverticulosis   . Erectile dysfunction   . Hemorrhoids   . History of skin cancer    basal cell removed left arm 09/07/14  . Hx of colonic polyps   . Hyperlipidemia   . Hypertension    orthostatic  . Hypertriglyceridemia   . Insulin resistance   . Rotator cuff tear    left  . Sleep apnea    recently diagnosed / has not started using c-pap yet  . 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  Jannifer Franklin  . Vitamin D deficiency     Past Surgical History:  Procedure Laterality Date  . COLONOSCOPY W/ BIOPSIES AND POLYPECTOMY  multiple, 2005, 08, 14  . [place]   right / for [place]  . NASAL SINUS SURGERY    . RECTAL BIOPSY    . SHOULDER OPEN ROTATOR CUFF REPAIR Left 09/14/2014   Procedure: LEFT SHOULDER MINI OPEN ROTATOR CUFF REPAIR AND SUBACROMIAL DECOMPRESSION ;  Surgeon: Susa Day, MD;  Location: [facility];  Service: Orthopedics;  Laterality: Left;    Social History   Tobacco Use  Smoking Status Never Smoker  Smokeless Tobacco Never Used    Social History   Substance and Sexual Activity  Alcohol Use Yes   Comment: rarely    Family History  Problem Relation Age of Onset  . Colon cancer Neg Hx   . Colon polyps Neg Hx   . Esophageal cancer Neg Hx   . Rectal cancer Neg Hx   . Stomach cancer Neg Hx     Reviw of Systems:  Reviewed in the HPI.  All other systems are negative.  Physical Exam: Blood pressure 120/84, pulse (!) 59, height 5' 11.5" (1.816 m), weight 221 lb 12.8 oz (100.6 kg), SpO2 96 %.  GEN:   Elderly male  HEENT: Normal NECK: No JVD; No carotid bruits LYMPHATICS: No lymphadenopathy CARDIAC: RRR , no murmurs, rubs, gallops RESPIRATORY:  Clear to auscultation without rales, wheezing or rhonchi  ABDOMEN: Soft, non-tender, non-distended MUSCULOSKELETAL:  No edema; No deformity  SKIN: Warm and dry NEUROLOGIC:  Alert and oriented x 3   ECG: June 15, 2018: Sinus bradycardia 59 beats minute.  Otherwise normal EKG.  Assessment / Plan:   1. Hyperlipidemia-      he has not been as careful with his carbohydrates and fat intake.  He also has not been exercising quite as much as he should.  Triglyceride levels remain elevated. His primary medical doctor has been managing his hyperlipidemia.  Would like to try Vasepa 2 g BID .   We will check fasting lipids , liver, BMP in 3 months     2.Hypertension-    he got  a letter stating  that valsartan is not readily available.  We will try him on olmesartan 20 mg a day.  We will check a basic metabolic profile in 3 weeks.  Will see him in 1 year    Mertie Moores, MD  06/15/2018 8:41 AM    [place] [place],  [place], [place]  [address] Pager [phone] Phone: [phone]; Fax: [phone]

## 2018-06-15 NOTE — Patient Instructions (Addendum)
Medication Instructions:  Your physician has recommended you make the following change in your medication:  STOP Valsartan (Diovan) START Olmesartan (Benicar) 20 mg once daily START Vascepa 2 grams (2 pills) twice daily  If you need a refill on your cardiac medications before your next appointment, please call your pharmacy.   Lab work: Your physician recommends that you return for lab work in: 3 weeks for basic metabolic panel  Your physician recommends that you return for lab work in: 3 months  You will need to FAST for this appointment - nothing to eat or drink after midnight the night before except water.   Testing/Procedures: None Ordered    Follow-Up: At [facility], you and your health needs are our priority.  As part of our continuing mission to provide you with exceptional heart care, we have created designated Provider Care Teams.  These Care Teams include your primary Cardiologist (physician) and Advanced Practice Providers (APPs -  Physician Assistants and Nurse Practitioners) who all work together to provide you with the care you need, when you need it. You will need a follow up appointment in:  1 years.  Please call our office 2 months in advance to schedule this appointment.  You may see Mertie Moores, MD or one of the following Advanced Practice Providers on your designated Care Team: Richardson Dopp, PA-C [place], [place] . Daune Perch, NP

## 2018-06-15 NOTE — Telephone Encounter (Addendum)
**Note De-Identified Kandace Elrod Obfuscation** We received a [employer] PA request form for the pts Vascepa. I have completed the form, Dr Sharrell Ku signed it and I have faxed it back to [facility].

## 2018-06-16 ENCOUNTER — Ambulatory Visit (AMBULATORY_SURGERY_CENTER): Payer: Medicare Other | Admitting: Internal Medicine

## 2018-06-16 ENCOUNTER — Encounter: Payer: Self-pay | Admitting: Internal Medicine

## 2018-06-16 VITALS — BP 124/79 | HR 59 | Temp 96.4°F | Resp 16 | Ht 71.0 in | Wt 218.0 lb

## 2018-06-16 DIAGNOSIS — I10 Essential (primary) hypertension: Secondary | ICD-10-CM | POA: Diagnosis not present

## 2018-06-16 DIAGNOSIS — Z86010 Personal history of colonic polyps: Secondary | ICD-10-CM

## 2018-06-16 DIAGNOSIS — G4733 Obstructive sleep apnea (adult) (pediatric): Secondary | ICD-10-CM | POA: Diagnosis not present

## 2018-06-16 DIAGNOSIS — E785 Hyperlipidemia, unspecified: Secondary | ICD-10-CM | POA: Diagnosis not present

## 2018-06-16 DIAGNOSIS — E119 Type 2 diabetes mellitus without complications: Secondary | ICD-10-CM | POA: Diagnosis not present

## 2018-06-16 MED ORDER — SODIUM CHLORIDE 0.9 % IV SOLN: 500.0000 mL | Freq: Once | INTRAVENOUS | Status: DC

## 2018-06-16 NOTE — Patient Instructions (Addendum)
I did not see any polyps today!  You do have diverticulosis - thickened muscle rings and pouches in the colon wall. Please read the handout about this condition.  I am not recommending any more routine colonoscopy tests or stool tests. Will investigate if problems (hope none occur!)  I appreciate the opportunity to care for you. Gatha Mayer, MD, FACG  YOU HAD AN ENDOSCOPIC PROCEDURE TODAY AT [facility]:   Refer to the procedure report that was given to you for any specific questions about what was found during the examination.  If the procedure report does not answer your questions, please call your gastroenterologist to clarify.  If you requested that your care partner not be given the details of your procedure findings, then the procedure report has been included in a sealed envelope for you to review at your convenience later.  YOU SHOULD EXPECT: Some feelings of bloating in the abdomen. Passage of more gas than usual.  Walking can help get rid of the air that was put into your GI tract during the procedure and reduce the bloating. If you had a lower endoscopy (such as a colonoscopy or flexible sigmoidoscopy) you may notice spotting of blood in your stool or on the toilet paper. If you underwent a bowel prep for your procedure, you may not have a normal bowel movement for a few days.  Please Note:  You might notice some irritation and congestion in your nose or some drainage.  This is from the oxygen used during your procedure.  There is no need for concern and it should clear up in a day or so.  SYMPTOMS TO REPORT IMMEDIATELY:   Following lower endoscopy (colonoscopy or flexible sigmoidoscopy):  Excessive amounts of blood in the stool  Significant tenderness or worsening of abdominal pains  Swelling of the abdomen that is new, acute  Fever of 100F or higher   For urgent or emergent issues, a gastroenterologist can be reached at any hour by calling [phone]  [phone].   DIET:  We do recommend a small meal at first, but then you may proceed to your regular diet.  Drink plenty of fluids but you should avoid alcoholic beverages for 24 hours.  ACTIVITY:  You should plan to take it easy for the rest of today and you should NOT DRIVE or use heavy machinery until tomorrow (because of the sedation medicines used during the test).    FOLLOW UP: Our staff will call the number listed on your records the next business day following your procedure to check on you and address any questions or concerns that you may have regarding the information given to you following your procedure. If we do not reach you, we will leave a message.  However, if you are feeling well and you are not experiencing any problems, there is no need to return our call.  We will assume that you have returned to your regular daily activities without incident.  If any biopsies were taken you will be contacted by phone or by letter within the next 1-3 weeks.  Please call us at [phone] if you have not heard about the biopsies in 3 weeks.    SIGNATURES/CONFIDENTIALITY: You and/or your care partner have signed paperwork which will be entered into your electronic medical record.  These signatures attest to the fact that that the information above on your After Visit Summary has been reviewed and is understood.  Full responsibility of the confidentiality of  this discharge information lies with you and/or your care-partner.

## 2018-06-16 NOTE — Progress Notes (Signed)
Pt's states no medical or surgical changes since previsit or office visit. 

## 2018-06-16 NOTE — Progress Notes (Signed)
Report to PACU, RN, vss, BBS= Clear.  

## 2018-06-16 NOTE — Op Note (Signed)
[place] Patient Name: Christian Ross Procedure Date: 06/16/2018 9:13 AM MRN: [id number] Endoscopist: Gatha Mayer , MD Age: 78 Referring MD:  Date of Birth: [birth year] Gender: Male Account #: [id number] Procedure:                Colonoscopy Indications:              Surveillance: Personal history of adenomatous                            polyps on last colonoscopy 5 years ago Medicines:                Propofol per Anesthesia, Monitored Anesthesia Care Procedure:                Pre-Anesthesia Assessment:                           - Prior to the procedure, a History and Physical                            was performed, and patient medications and                            allergies were reviewed. The patient's tolerance of                            previous anesthesia was also reviewed. The risks                            and benefits of the procedure and the sedation                            options and risks were discussed with the patient.                            All questions were answered, and informed consent                            was obtained. Prior Anticoagulants: The patient has                            taken no previous anticoagulant or antiplatelet                            agents. ASA Grade Assessment: II - A patient with                            mild systemic disease. After reviewing the risks                            and benefits, the patient was deemed in                            satisfactory condition to undergo the procedure.  After obtaining informed consent, the colonoscope                            was passed under direct vision. Throughout the                            procedure, the patient's blood pressure, pulse, and                            oxygen saturations were monitored continuously. The                            Colonoscope was introduced through the anus and   advanced to the the cecum, identified by                            appendiceal orifice and ileocecal valve. The                            colonoscopy was performed without difficulty. The                            patient tolerated the procedure well. The quality                            of the bowel preparation was excellent. The                            ileocecal valve, appendiceal orifice, and rectum                            were photographed. The bowel preparation used was                            Miralax. Scope In: 9:20:36 AM Scope Out: 9:31:45 AM Scope Withdrawal Time: 0 hours 8 minutes 3 seconds  Total Procedure Duration: 0 hours 11 minutes 9 seconds  Findings:                 The perianal and digital rectal examinations were                            normal.                           Diverticula were found in the sigmoid colon.                           The exam was otherwise without abnormality on                            direct and retroflexion views. Complications:            No immediate complications. Estimated Blood Loss:     Estimated blood loss: none. Impression:               - Diverticulosis in  the sigmoid colon.                           - The examination was otherwise normal on direct                            and retroflexion views.                           - No specimens collected.                           - Personal history of colonic polyps. Recommendation:           - Patient has a contact number available for                            emergencies. The signs and symptoms of potential                            delayed complications were discussed with the                            patient. Return to normal activities tomorrow.                            Written discharge instructions were provided to the                            patient.                           - Resume previous diet.                           - Continue present  medications.                           - No repeat colonoscopy due to age. Gatha Mayer, MD 06/16/2018 9:39:15 AM This report has been signed electronically.

## 2018-06-17 ENCOUNTER — Telehealth: Payer: Self-pay

## 2018-06-17 ENCOUNTER — Telehealth: Payer: Self-pay | Admitting: *Deleted

## 2018-06-17 NOTE — Telephone Encounter (Signed)
  Follow up Call-  Call back number 06/16/2018  Post procedure Call Back phone  # [phone]  Permission to leave phone message Yes  Some recent data might be hidden     Patient questions:  Do you have a fever, pain , or abdominal swelling? No. Pain Score  0 *  Have you tolerated food without any problems? Yes.    Have you been able to return to your normal activities? Yes.    Do you have any questions about your discharge instructions: Diet   No. Medications  No. Follow up visit  No.  Do you have questions or concerns about your Care? No.  Actions: * If pain score is 4 or above: No action needed, pain <4.

## 2018-06-17 NOTE — Telephone Encounter (Signed)
Follow up call made, number identifier, left a vm.

## 2018-06-18 NOTE — Telephone Encounter (Signed)
Letter received from [facility] stating that they have approved the pts Vascepa PA. Approval good until 04/20/2098.  I have notified the pts pharmacy of this approval.

## 2018-07-05 ENCOUNTER — Other Ambulatory Visit: Payer: Medicare Other

## 2018-07-06 ENCOUNTER — Other Ambulatory Visit: Payer: Medicare Other

## 2018-07-06 ENCOUNTER — Telehealth: Payer: Self-pay

## 2018-07-06 ENCOUNTER — Other Ambulatory Visit: Payer: Self-pay | Admitting: Cardiovascular Disease

## 2018-07-06 ENCOUNTER — Other Ambulatory Visit: Payer: Self-pay

## 2018-07-06 DIAGNOSIS — E781 Pure hyperglyceridemia: Secondary | ICD-10-CM | POA: Diagnosis not present

## 2018-07-06 DIAGNOSIS — I10 Essential (primary) hypertension: Secondary | ICD-10-CM | POA: Diagnosis not present

## 2018-07-06 DIAGNOSIS — E875 Hyperkalemia: Secondary | ICD-10-CM

## 2018-07-06 LAB — BASIC METABOLIC PANEL
BUN/Creatinine Ratio: 23 (ref 10–24)
BUN: 44 mg/dL — AB (ref 8–27)
CALCIUM: 9.9 mg/dL (ref 8.6–10.2)
CO2: 20 mmol/L (ref 20–29)
Chloride: 105 mmol/L (ref 96–106)
Creatinine, Ser: 1.9 mg/dL — ABNORMAL HIGH (ref 0.76–1.27)
GFR calc non Af Amer: 34 mL/min/{1.73_m2} — ABNORMAL LOW (ref >59)
GFR, EST AFRICAN AMERICAN: 39 mL/min/{1.73_m2} — AB (ref >59)
Glucose: 173 mg/dL — ABNORMAL HIGH (ref 65–99)
Potassium: 5.4 mmol/L — ABNORMAL HIGH (ref 3.5–5.2)
Sodium: 141 mmol/L (ref 134–144)

## 2018-07-06 NOTE — Telephone Encounter (Signed)
Consulted DOD, Dr. Tamala Julian with lab results. Patient's potassium 5.4, creatinine 1.90, and BUN 44. Dr. Tamala Julian recommends patient to stop Olmesartan for now, come in next week for repeat BMET, and decrease potassium rich food. Called patient with these instructions. Informed patient that Dr. Acie Fredrickson and his nurse will get back with him on HTN medication he will need to take instead. Patient verbalized understanding and will come in on 07/13/2018 for BMET.

## 2018-07-13 ENCOUNTER — Other Ambulatory Visit: Payer: Medicare Other

## 2018-07-13 DIAGNOSIS — E875 Hyperkalemia: Secondary | ICD-10-CM | POA: Diagnosis not present

## 2018-07-13 LAB — BASIC METABOLIC PANEL
BUN/Creatinine Ratio: 19 (ref 10–24)
BUN: 29 mg/dL — ABNORMAL HIGH (ref 8–27)
CALCIUM: 9.6 mg/dL (ref 8.6–10.2)
CO2: 23 mmol/L (ref 20–29)
Chloride: 108 mmol/L — ABNORMAL HIGH (ref 96–106)
Creatinine, Ser: 1.56 mg/dL — ABNORMAL HIGH (ref 0.76–1.27)
GFR calc Af Amer: 49 mL/min/{1.73_m2} — ABNORMAL LOW (ref >59)
GFR calc non Af Amer: 43 mL/min/{1.73_m2} — ABNORMAL LOW (ref >59)
Glucose: 183 mg/dL — ABNORMAL HIGH (ref 65–99)
POTASSIUM: 4.7 mmol/L (ref 3.5–5.2)
Sodium: 139 mmol/L (ref 134–144)

## 2018-09-02 ENCOUNTER — Other Ambulatory Visit: Payer: Self-pay | Admitting: Cardiovascular Disease

## 2018-09-14 ENCOUNTER — Other Ambulatory Visit: Payer: Medicare Other

## 2018-09-14 ENCOUNTER — Other Ambulatory Visit: Payer: Self-pay

## 2018-09-14 DIAGNOSIS — E781 Pure hyperglyceridemia: Secondary | ICD-10-CM

## 2018-09-14 DIAGNOSIS — I10 Essential (primary) hypertension: Secondary | ICD-10-CM

## 2018-09-14 LAB — BASIC METABOLIC PANEL
BUN/Creatinine Ratio: 17 (ref 10–24)
BUN: 29 mg/dL — ABNORMAL HIGH (ref 8–27)
CO2: 20 mmol/L (ref 20–29)
Calcium: 9.4 mg/dL (ref 8.6–10.2)
Chloride: 104 mmol/L (ref 96–106)
Creatinine, Ser: 1.66 mg/dL — ABNORMAL HIGH (ref 0.76–1.27)
GFR calc Af Amer: 46 mL/min/{1.73_m2} — ABNORMAL LOW (ref >59)
GFR calc non Af Amer: 39 mL/min/{1.73_m2} — ABNORMAL LOW (ref >59)
Glucose: 150 mg/dL — ABNORMAL HIGH (ref 65–99)
Potassium: 4.2 mmol/L (ref 3.5–5.2)
Sodium: 139 mmol/L (ref 134–144)

## 2018-09-14 LAB — LIPID PANEL
Chol/HDL Ratio: 7.7 ratio — ABNORMAL HIGH (ref 0.0–5.0)
Cholesterol, Total: 199 mg/dL (ref 100–199)
HDL: 26 mg/dL — ABNORMAL LOW (ref >39)
LDL Calculated: 108 mg/dL — ABNORMAL HIGH (ref 0–99)
Triglycerides: 327 mg/dL — ABNORMAL HIGH (ref 0–149)
VLDL Cholesterol Cal: 65 mg/dL — ABNORMAL HIGH (ref 5–40)

## 2018-09-14 LAB — HEPATIC FUNCTION PANEL
ALT: 28 IU/L (ref 0–44)
AST: 23 IU/L (ref 0–40)
Albumin: 4.3 g/dL (ref 3.7–4.7)
Alkaline Phosphatase: 32 IU/L — ABNORMAL LOW (ref 39–117)
Bilirubin Total: 0.3 mg/dL (ref 0.0–1.2)
Bilirubin, Direct: 0.12 mg/dL (ref 0.00–0.40)
Total Protein: 7 g/dL (ref 6.0–8.5)

## 2018-09-17 ENCOUNTER — Telehealth: Payer: Self-pay | Admitting: Cardiovascular Disease

## 2018-09-17 NOTE — Telephone Encounter (Signed)
Follow Up: ° ° °Returning your call,concerning his lab results. °

## 2018-09-17 NOTE — Telephone Encounter (Signed)
Reviewed results of lab work with patient. He admits to eating less salads and exercising less. We discussed ways for him to be more physically active and I answered questions to his satisfaction. He agrees to work on better diet and exercise and thanked me for the call.

## 2018-10-04 DIAGNOSIS — H01112 Allergic dermatitis of right lower eyelid: Secondary | ICD-10-CM | POA: Diagnosis not present

## 2018-10-04 DIAGNOSIS — H01114 Allergic dermatitis of left upper eyelid: Secondary | ICD-10-CM | POA: Diagnosis not present

## 2018-10-04 DIAGNOSIS — H01111 Allergic dermatitis of right upper eyelid: Secondary | ICD-10-CM | POA: Diagnosis not present

## 2018-10-05 DIAGNOSIS — Z23 Encounter for immunization: Secondary | ICD-10-CM | POA: Diagnosis not present

## 2018-10-13 DIAGNOSIS — H01115 Allergic dermatitis of left lower eyelid: Secondary | ICD-10-CM | POA: Diagnosis not present

## 2018-10-13 DIAGNOSIS — H01111 Allergic dermatitis of right upper eyelid: Secondary | ICD-10-CM | POA: Diagnosis not present

## 2018-10-13 DIAGNOSIS — H01112 Allergic dermatitis of right lower eyelid: Secondary | ICD-10-CM | POA: Diagnosis not present

## 2018-10-13 DIAGNOSIS — H01114 Allergic dermatitis of left upper eyelid: Secondary | ICD-10-CM | POA: Diagnosis not present

## 2018-10-17 ENCOUNTER — Other Ambulatory Visit: Payer: Self-pay | Admitting: Neurology

## 2018-11-03 DIAGNOSIS — E1169 Type 2 diabetes mellitus with other specified complication: Secondary | ICD-10-CM | POA: Diagnosis not present

## 2018-11-03 DIAGNOSIS — Z7984 Long term (current) use of oral hypoglycemic drugs: Secondary | ICD-10-CM | POA: Diagnosis not present

## 2018-11-03 DIAGNOSIS — N183 Chronic kidney disease, stage 3 (moderate): Secondary | ICD-10-CM | POA: Diagnosis not present

## 2018-11-03 DIAGNOSIS — I10 Essential (primary) hypertension: Secondary | ICD-10-CM | POA: Diagnosis not present

## 2018-11-03 DIAGNOSIS — E1165 Type 2 diabetes mellitus with hyperglycemia: Secondary | ICD-10-CM | POA: Diagnosis not present

## 2018-11-10 ENCOUNTER — Ambulatory Visit (INDEPENDENT_AMBULATORY_CARE_PROVIDER_SITE_OTHER): Payer: Medicare Other | Admitting: Neurology

## 2018-11-10 ENCOUNTER — Other Ambulatory Visit: Payer: Self-pay

## 2018-11-10 ENCOUNTER — Encounter: Payer: Self-pay | Admitting: Neurology

## 2018-11-10 VITALS — BP 117/70 | HR 77 | Temp 97.5°F | Ht 71.5 in | Wt 213.0 lb

## 2018-11-10 DIAGNOSIS — G44059 Short lasting unilateral neuralgiform headache with conjunctival injection and tearing (SUNCT), not intractable: Secondary | ICD-10-CM | POA: Diagnosis not present

## 2018-11-10 NOTE — Progress Notes (Signed)
Reason for visit: SUNCT headache  Christian Ross is an 78 y.o. male  History of present illness:  [name] is a 78 year old right-handed white male with a history of diabetes and history of SUNCT headache.  The patient was to come off of the Lamictal on the last visit but he never tapered the medication.  He remains on 50 mg twice daily.  The patient is also on 50 mg twice daily of the Topamax.  He has not had any twinges of pain whatsoever.  He recently had some adjustments to his diabetic medications secondary to elevated blood sugars.  Otherwise, he has done well medically.  Past Medical History:  Diagnosis Date  . Allergy   . Arthritis   . Borderline diabetes    no meds / elevated HgA1C  . Cancer ([place])    skin pre cancer nose  . Cough May 2016   new x78mo, denies fever and shortness of breath  . Diabetes mellitus without complication ([place])   . Diverticulitis   . Diverticulosis   . Erectile dysfunction   . Hemorrhoids   . History of skin cancer    basal cell removed left arm 09/07/14  . Hx of colonic polyps   . Hyperlipidemia   . Hypertension    orthostatic  . Hypertriglyceridemia   . Insulin resistance   . Rotator cuff tear    left  . Sleep apnea    recently diagnosed / has not started using c-pap yet  . 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 Vitamin D deficiency     Past Surgical History:  Procedure Laterality Date  . COLONOSCOPY W/ BIOPSIES AND POLYPECTOMY  multiple, 2005, 08, 14  . [place]   right / for [place]  . NASAL SINUS SURGERY    . RECTAL BIOPSY    . SHOULDER OPEN ROTATOR CUFF REPAIR Left 09/14/2014   Procedure: LEFT SHOULDER MINI OPEN ROTATOR CUFF REPAIR AND SUBACROMIAL DECOMPRESSION ;  Surgeon: Susa Day, MD;  Location: [facility];  Service: Orthopedics;  Laterality: Left;    Family History  Problem Relation Age of Onset  . Colon cancer Neg Hx   . Colon polyps Neg Hx   . Esophageal cancer Neg Hx   . Rectal  cancer Neg Hx   . Stomach cancer Neg Hx     Social history:  reports that he has never smoked. He has never used smokeless tobacco. He reports current alcohol use. He reports that he does not use drugs.    Allergies  Allergen Reactions  . Lisinopril Cough    Causes a cough    Medications:  Prior to Admission medications   Medication Sig Start Date End Date Taking? Authorizing Provider  Ascorbic Acid (VITAMIN C PO) Take 3 g by mouth every morning.    Yes [provider]  aspirin 81 MG tablet Take 81 mg by mouth every evening.    Yes [provider]  Berberine Chloride POWD Take 800 mg by mouth every morning.    Yes [provider]  glipiZIDE (GLUCOTROL XL) 10 MG 24 hr tablet  11/08/18  Yes [provider]  Icosapent Ethyl (VASCEPA) 1 g CAPS Take 2 capsules (2 g total) by mouth 2 (two) times daily. 06/15/18  Yes Nahser, Wonda Cheng, MD  lamoTRIgine (LAMICTAL) 25 MG tablet TAKE 2 TABLETS TWICE A DAY 10/18/18  Yes Kathrynn Ducking, MD  Lecithin 1200 MG CAPS Take  2 capsules by mouth every morning.   Yes [provider]  metoprolol tartrate (LOPRESSOR) 25 MG tablet TAKE 1 TABLET TWICE A DAY 07/06/18  Yes Nahser, Wonda Cheng, MD  mometasone (NASONEX) 50 MCG/ACT nasal spray Place 2 sprays into the nose daily as needed (allergies).   Yes [provider]  omega-3 acid ethyl esters (LOVAZA) 1 g capsule Take 3 capsules (3 g total) daily by mouth. 03/09/17  Yes Nahser, Wonda Cheng, MD  OVER THE COUNTER MEDICATION Take 2 tablets by mouth every morning. Drain   Yes [provider]  Saw Palmetto, Serenoa repens, (SAW PALMETTO PO) Take by mouth.   Yes [provider]  tobramycin-dexamethasone (TOBRADEX) ophthalmic solution SHAKE LQ AND INT 1 GTT IN OU TID 10/04/18  Yes [provider]  topiramate (TOPAMAX) 50 MG tablet Take 1 tablet (50 mg total) by mouth 2 (two) times daily. 05/13/18  Yes Kathrynn Ducking, MD  TRICOR 145 MG  tablet TAKE 1 TABLET EVERY EVENING 09/03/18  Yes Nahser, Wonda Cheng, MD  TURMERIC PO Take by mouth.   Yes [provider]  Ubiquinol 200 MG CAPS Take 400 mg by mouth every morning.   Yes [provider]  irbesartan (AVAPRO) 150 MG tablet Take 1 tablet (150 mg total) by mouth daily. 06/24/11 06/26/11  Nahser, Wonda Cheng, MD    ROS:  Out of a complete 14 system review of symptoms, the patient complains only of the following symptoms, and all other reviewed systems are negative.  Fatigue  Blood pressure 117/70, pulse 77, temperature (!) 97.5 F (36.4 C), temperature source Temporal, height 5' 11.5" (1.816 m), weight 213 lb (96.6 kg).  Physical Exam  General: The patient is alert and cooperative at the time of the examination.  The patient is mildly obese.  Skin: No significant peripheral edema is noted.   Neurologic Exam  Mental status: The patient is alert and oriented x 3 at the time of the examination. The patient has apparent normal recent and remote memory, with an apparently normal attention span and concentration ability.   Cranial nerves: Facial symmetry is present. Speech is normal, no aphasia or dysarthria is noted. Extraocular movements are full. Visual fields are full.  Motor: The patient has good strength in all 4 extremities.  Sensory examination: Soft touch sensation is symmetric on the face, arms, and legs.  Coordination: The patient has good finger-nose-finger and heel-to-shin bilaterally.  Gait and station: The patient has a normal gait. Tandem gait is unsteady. Romberg is negative. No drift is seen.  Reflexes: Deep tendon reflexes are symmetric.   Assessment/Plan:  1. SUNCT headache  The patient is doing well on Topamax and Lamictal, we will once again initiate a taper off of the Lamictal going to 25 mg twice daily for 3 weeks and then stop the medication.  If any twinges of pain are noted, he is to get back on the Lamictal.  He will remain on the  Topamax 50 mg twice daily.  He will follow-up in 1 year.  Jill Alexanders MD 11/10/2018 7:57 AM  [facility] [address] [place], [place] [address]  Phone [phone] Fax [phone]

## 2018-11-10 NOTE — Patient Instructions (Signed)
With the lamotrigine 25 mg tablet, go to one tablet twice a day for 3 weeks, then stop the medication. Continue the Topamax.

## 2018-11-17 DIAGNOSIS — E1169 Type 2 diabetes mellitus with other specified complication: Secondary | ICD-10-CM | POA: Diagnosis not present

## 2018-11-17 DIAGNOSIS — G4733 Obstructive sleep apnea (adult) (pediatric): Secondary | ICD-10-CM | POA: Diagnosis not present

## 2019-02-17 ENCOUNTER — Other Ambulatory Visit: Payer: Self-pay | Admitting: Nurse Practitioner

## 2019-02-17 MED ORDER — VASCEPA 1 G PO CAPS: 2.0000 g | ORAL_CAPSULE | Freq: Two times a day (BID) | ORAL | 3 refills | Status: DC

## 2019-03-08 ENCOUNTER — Telehealth: Payer: Self-pay | Admitting: Neurology

## 2019-03-08 MED ORDER — LAMOTRIGINE 25 MG PO TABS: 50.0000 mg | ORAL_TABLET | Freq: Two times a day (BID) | ORAL | 3 refills | Status: DC

## 2019-03-08 NOTE — Telephone Encounter (Signed)
Pt was advised the last time to d/c of the Lamictal but did not adhere to instructions. Rx for Lamictal 25 mg [facility]

## 2019-03-08 NOTE — Telephone Encounter (Signed)
Pt. Called and LVM stating that the "Flashes" he was having came back on Nov. 14th and he had some lamoTRIgine (LAMICTAL) 25 MG tablet left from the last time so he started taking them 2 in the morning and 2 in the afternoon but because they are old he does not know if they have lost their potency and he would like it to be renewed and sent to [facility]. Please advise.

## 2019-03-15 DIAGNOSIS — Z86018 Personal history of other benign neoplasm: Secondary | ICD-10-CM | POA: Diagnosis not present

## 2019-03-15 DIAGNOSIS — L570 Actinic keratosis: Secondary | ICD-10-CM | POA: Diagnosis not present

## 2019-03-15 DIAGNOSIS — D225 Melanocytic nevi of trunk: Secondary | ICD-10-CM | POA: Diagnosis not present

## 2019-03-15 DIAGNOSIS — L814 Other melanin hyperpigmentation: Secondary | ICD-10-CM | POA: Diagnosis not present

## 2019-03-15 DIAGNOSIS — Z85828 Personal history of other malignant neoplasm of skin: Secondary | ICD-10-CM | POA: Diagnosis not present

## 2019-03-15 DIAGNOSIS — L821 Other seborrheic keratosis: Secondary | ICD-10-CM | POA: Diagnosis not present

## 2019-03-15 DIAGNOSIS — Z23 Encounter for immunization: Secondary | ICD-10-CM | POA: Diagnosis not present

## 2019-03-31 ENCOUNTER — Telehealth: Payer: Self-pay | Admitting: Cardiovascular Disease

## 2019-03-31 DIAGNOSIS — E785 Hyperlipidemia, unspecified: Secondary | ICD-10-CM

## 2019-03-31 DIAGNOSIS — Z79899 Other long term (current) drug therapy: Secondary | ICD-10-CM

## 2019-03-31 DIAGNOSIS — E781 Pure hyperglyceridemia: Secondary | ICD-10-CM

## 2019-03-31 NOTE — Telephone Encounter (Signed)
LMTCB   *LABS HAVE BEEN ORDERED IN [facility]

## 2019-03-31 NOTE — Telephone Encounter (Signed)
New Message  Patient wants to know if he will be scheduled for lab work before appt on 06/15/19. Please advise the patient.

## 2019-03-31 NOTE — Telephone Encounter (Signed)
Lets bet lipids, BMP, liver enz prior to visit Prior to visit

## 2019-03-31 NOTE — Telephone Encounter (Signed)
Pts last labs were 08/2018... Hepatic, lipid, bmet: Will forward to see if Dr. Acie Fredrickson would like labs prior to the pts 06/15/2019 visit with hm.

## 2019-04-04 NOTE — Telephone Encounter (Signed)
Message sent to patient via MyChart regarding lab work ordered.

## 2019-05-13 ENCOUNTER — Other Ambulatory Visit: Payer: Self-pay | Admitting: Cardiovascular Disease

## 2019-05-18 DIAGNOSIS — I10 Essential (primary) hypertension: Secondary | ICD-10-CM | POA: Diagnosis not present

## 2019-05-18 DIAGNOSIS — E1169 Type 2 diabetes mellitus with other specified complication: Secondary | ICD-10-CM | POA: Diagnosis not present

## 2019-05-18 DIAGNOSIS — N183 Chronic kidney disease, stage 3 unspecified: Secondary | ICD-10-CM | POA: Diagnosis not present

## 2019-05-18 DIAGNOSIS — E785 Hyperlipidemia, unspecified: Secondary | ICD-10-CM | POA: Diagnosis not present

## 2019-05-18 DIAGNOSIS — Z0000 Encounter for general adult medical examination without abnormal findings: Secondary | ICD-10-CM | POA: Diagnosis not present

## 2019-05-18 DIAGNOSIS — Z13220 Encounter for screening for lipoid disorders: Secondary | ICD-10-CM | POA: Diagnosis not present

## 2019-05-18 DIAGNOSIS — G4733 Obstructive sleep apnea (adult) (pediatric): Secondary | ICD-10-CM | POA: Diagnosis not present

## 2019-05-18 DIAGNOSIS — G44059 Short lasting unilateral neuralgiform headache with conjunctival injection and tearing (SUNCT), not intractable: Secondary | ICD-10-CM | POA: Diagnosis not present

## 2019-05-20 DIAGNOSIS — H35033 Hypertensive retinopathy, bilateral: Secondary | ICD-10-CM | POA: Diagnosis not present

## 2019-05-20 DIAGNOSIS — H0100A Unspecified blepharitis right eye, upper and lower eyelids: Secondary | ICD-10-CM | POA: Diagnosis not present

## 2019-05-20 DIAGNOSIS — H524 Presbyopia: Secondary | ICD-10-CM | POA: Diagnosis not present

## 2019-05-20 DIAGNOSIS — H2513 Age-related nuclear cataract, bilateral: Secondary | ICD-10-CM | POA: Diagnosis not present

## 2019-05-20 DIAGNOSIS — E119 Type 2 diabetes mellitus without complications: Secondary | ICD-10-CM | POA: Diagnosis not present

## 2019-05-20 DIAGNOSIS — H52223 Regular astigmatism, bilateral: Secondary | ICD-10-CM | POA: Diagnosis not present

## 2019-05-20 DIAGNOSIS — H43812 Vitreous degeneration, left eye: Secondary | ICD-10-CM | POA: Diagnosis not present

## 2019-06-13 ENCOUNTER — Other Ambulatory Visit: Payer: Medicare Other | Admitting: *Deleted

## 2019-06-13 ENCOUNTER — Other Ambulatory Visit: Payer: Self-pay

## 2019-06-13 DIAGNOSIS — E781 Pure hyperglyceridemia: Secondary | ICD-10-CM | POA: Diagnosis not present

## 2019-06-13 DIAGNOSIS — Z79899 Other long term (current) drug therapy: Secondary | ICD-10-CM

## 2019-06-13 DIAGNOSIS — E785 Hyperlipidemia, unspecified: Secondary | ICD-10-CM

## 2019-06-13 LAB — BASIC METABOLIC PANEL
BUN/Creatinine Ratio: 11 (ref 10–24)
BUN: 16 mg/dL (ref 8–27)
CO2: 16 mmol/L — ABNORMAL LOW (ref 20–29)
Calcium: 9.4 mg/dL (ref 8.6–10.2)
Chloride: 106 mmol/L (ref 96–106)
Creatinine, Ser: 1.42 mg/dL — ABNORMAL HIGH (ref 0.76–1.27)
GFR calc Af Amer: 55 mL/min/{1.73_m2} — ABNORMAL LOW (ref >59)
GFR calc non Af Amer: 48 mL/min/{1.73_m2} — ABNORMAL LOW (ref >59)
Glucose: 184 mg/dL — ABNORMAL HIGH (ref 65–99)
Potassium: 4.3 mmol/L (ref 3.5–5.2)
Sodium: 138 mmol/L (ref 134–144)

## 2019-06-13 LAB — HEPATIC FUNCTION PANEL
ALT: 29 IU/L (ref 0–44)
AST: 23 IU/L (ref 0–40)
Albumin: 4.4 g/dL (ref 3.7–4.7)
Alkaline Phosphatase: 56 IU/L (ref 39–117)
Bilirubin Total: 0.3 mg/dL (ref 0.0–1.2)
Bilirubin, Direct: 0.11 mg/dL (ref 0.00–0.40)
Total Protein: 7.5 g/dL (ref 6.0–8.5)

## 2019-06-13 LAB — LIPID PANEL
Chol/HDL Ratio: 6.5 ratio — ABNORMAL HIGH (ref 0.0–5.0)
Cholesterol, Total: 188 mg/dL (ref 100–199)
HDL: 29 mg/dL — ABNORMAL LOW (ref >39)
LDL Chol Calc (NIH): 101 mg/dL — ABNORMAL HIGH (ref 0–99)
Triglycerides: 344 mg/dL — ABNORMAL HIGH (ref 0–149)
VLDL Cholesterol Cal: 58 mg/dL — ABNORMAL HIGH (ref 5–40)

## 2019-06-14 ENCOUNTER — Encounter: Payer: Self-pay | Admitting: Cardiovascular Disease

## 2019-06-14 NOTE — Progress Notes (Signed)
Christian Ross Date of Birth  [birth year]             1. Hyperlipidemia 2.Hypertension  Notes prior to 2014:   Christian Ross seems to be doing well.  He continues to have elevations in his triglyceride levels.  He is tolerating the crestor now.    Sept. 15, 2014:   Still active flying, not exercising as much as he should.  No CP or dyspnea.  Sept. 24, 2015:  Christian Ross is doing well.  Has lost 10 lbs since  I last saw him.  Exercising more.  HbA1C  Has improved.   He is no longer flying because he is on Lamactil.    Sept. 26, 2016:     Doing well from a cardiac standpoint On Flomax for BPH.  No cardiac issues.   Sept. 19, 2017  Doing well from a cardiac standpoint. Had some foot issues.   Was in a boot for several months  Now has orthotic arch supports in his shoes.  Glucose has been slightly elevated.    October 20, 2016:  Christian Ross is seen today for follow up .  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 25, 2019:  No longer is flying . He is off the meds that prevented him from passing his medical  No CP or dyspnea Knows that he needs to exercise more His medical doctor is following his DM and lipids   Feb. 25, 2020: He was seen today for follow-up of his chest pain and hyperlipidemia. Has a question about Vascepa  He had recent labs drawn drawn at his primary medical office.  His last cholesterol was 213.  His HDL is 31.  The LDL is 125.  The triglyceride level is 281.  Open A1c is 7.7.  Hemoglobin is 13.2.  TSH is 3.79. Still eating lots of carbs and fats Is not walking    Feb. 24, 2021 Christian Ross is seen for follow up of his CP, HTN, HLD We started Vascepa at his last visit  Recent Trigs are still markedly elevated - 344 Chol = 188,  LDL is 101 BMP is stable  Eating more sweets than he should.  Is not exercising much     Current Outpatient Medications on File Prior to Visit  Medication Sig Dispense Refill  . Ascorbic  Acid (VITAMIN C PO) Take 3 g by mouth every morning.     Marland Kitchen aspirin 81 MG tablet Take 81 mg by mouth every evening.     . Berberine Chloride POWD Take 800 mg by mouth every morning.     Marland Kitchen glipiZIDE (GLUCOTROL XL) 10 MG 24 hr tablet     . Icosapent Ethyl (VASCEPA) 1 g CAPS Take 2 capsules (2 g total) by mouth 2 (two) times daily. 360 capsule 3  . lamoTRIgine (LAMICTAL) 25 MG tablet Take 2 tablets (50 mg total) by mouth 2 (two) times daily. 360 tablet 3  . Lecithin 1200 MG CAPS Take 2 capsules by mouth every morning.    . metoprolol tartrate (LOPRESSOR) 25 MG tablet TAKE 1 TABLET TWICE A DAY 180 tablet 3  . mometasone (NASONEX) 50 MCG/ACT nasal spray Place 2 sprays into the nose daily as needed (allergies).    Marland Kitchen OVER THE COUNTER MEDICATION Take 2 tablets by mouth every morning. [place]    . Saw Palmetto, Serenoa repens, (SAW PALMETTO PO) Take by mouth.    Marland Kitchen  tobramycin-dexamethasone (TOBRADEX) ophthalmic solution SHAKE LQ AND INT 1 GTT IN OU TID    . topiramate (TOPAMAX) 50 MG tablet Take 1 tablet (50 mg total) by mouth 2 (two) times daily. 180 tablet 3  . TURMERIC PO Take by mouth.    . Ubiquinol 200 MG CAPS Take 400 mg by mouth every morning.    . [DISCONTINUED] irbesartan (AVAPRO) 150 MG tablet Take 1 tablet (150 mg total) by mouth daily. 90 tablet 3   No current facility-administered medications on file prior to visit.    Allergies  Allergen Reactions  . Lisinopril Cough    Causes a cough    Past Medical History:  Diagnosis Date  . Allergy   . Arthritis   . Borderline diabetes    no meds / elevated HgA1C  . Cancer ([place])    skin pre cancer nose  . Cough May 2016   new x24mo, denies fever and shortness of breath  . Diabetes mellitus without complication ([place])   . Diverticulitis   . Diverticulosis   . Erectile dysfunction   . Hemorrhoids   . History of skin cancer    basal cell removed left arm 09/07/14  . Hx of colonic polyps   . Hyperlipidemia   . Hypertension     orthostatic  . Hypertriglyceridemia   . Insulin resistance   . Rotator cuff tear    left  . Sleep apnea    recently diagnosed / has not started using c-pap yet  . 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 Vitamin D deficiency     Past Surgical History:  Procedure Laterality Date  . COLONOSCOPY W/ BIOPSIES AND POLYPECTOMY  multiple, 2005, 08, 14  . [place]   right / for [place]  . NASAL SINUS SURGERY    . RECTAL BIOPSY    . SHOULDER OPEN ROTATOR CUFF REPAIR Left 09/14/2014   Procedure: LEFT SHOULDER MINI OPEN ROTATOR CUFF REPAIR AND SUBACROMIAL DECOMPRESSION ;  Surgeon: Susa Day, MD;  Location: [facility];  Service: Orthopedics;  Laterality: Left;    Social History   Tobacco Use  Smoking Status Never Smoker  Smokeless Tobacco Never Used    Social History   Substance and Sexual Activity  Alcohol Use Yes   Comment: rarely    Family History  Problem Relation Age of Onset  . Colon cancer Neg Hx   . Colon polyps Neg Hx   . Esophageal cancer Neg Hx   . Rectal cancer Neg Hx   . Stomach cancer Neg Hx     Reviw of Systems:  Reviewed in the HPI.  All other systems are negative.  Physical Exam: Blood pressure 132/82, pulse 81, height 5\' 11"  (1.803 m), weight 220 lb 6.4 oz (100 kg), SpO2 99 %.  GEN:  Elderly male  HEENT: Normal NECK: No JVD; No carotid bruits LYMPHATICS: No lymphadenopathy CARDIAC: RRR , no murmurs, rubs, gallops RESPIRATORY:  Clear to auscultation without rales, wheezing or rhonchi  ABDOMEN: Soft, non-tender, non-distended MUSCULOSKELETAL:  No edema; No deformity  SKIN: Warm and dry NEUROLOGIC:  Alert and oriented x 3   ECG: .Feb. 24   Assessment / Plan:   1. Hyperlipidemia-        Is on vascpa.   Has tried statins .   Also on Co-Q 10.  We discussed the importance of avoiding sweets and carbohydrates.  We discussed the importance of regular exercise. Will check labs 1 week  prior    2.Hypertension-     Will see  him in 1 year    Mertie Moores, MD  06/15/2019 8:31 AM    [address],  [place] [place], [place]  [id number] Pager [phone] Phone: [phone]; Fax: [phone]

## 2019-06-15 ENCOUNTER — Other Ambulatory Visit: Payer: Self-pay

## 2019-06-15 ENCOUNTER — Ambulatory Visit (INDEPENDENT_AMBULATORY_CARE_PROVIDER_SITE_OTHER): Payer: Medicare Other | Admitting: Cardiovascular Disease

## 2019-06-15 ENCOUNTER — Encounter: Payer: Self-pay | Admitting: Cardiovascular Disease

## 2019-06-15 ENCOUNTER — Other Ambulatory Visit: Payer: Self-pay | Admitting: *Deleted

## 2019-06-15 VITALS — BP 132/82 | HR 81 | Ht 71.0 in | Wt 220.4 lb

## 2019-06-15 DIAGNOSIS — R739 Hyperglycemia, unspecified: Secondary | ICD-10-CM

## 2019-06-15 DIAGNOSIS — E781 Pure hyperglyceridemia: Secondary | ICD-10-CM | POA: Diagnosis not present

## 2019-06-15 DIAGNOSIS — I10 Essential (primary) hypertension: Secondary | ICD-10-CM | POA: Diagnosis not present

## 2019-06-15 DIAGNOSIS — E785 Hyperlipidemia, unspecified: Secondary | ICD-10-CM | POA: Diagnosis not present

## 2019-06-15 NOTE — Patient Instructions (Addendum)
Your physician recommends that you continue on your current medications as directed. Please refer to the Current Medication list given to you today.  Your physician recommends that you return for lab work in:  [place]  Your physician wants you to follow-up in: [place] will receive a reminder letter in the mail two months in advance. If you don't receive a letter, please call our office to schedule the follow-up appointment.

## 2019-06-15 NOTE — Progress Notes (Unsigned)
BMET 

## 2019-07-01 ENCOUNTER — Other Ambulatory Visit: Payer: Self-pay | Admitting: Neurology

## 2019-07-01 ENCOUNTER — Telehealth: Payer: Self-pay

## 2019-07-01 ENCOUNTER — Other Ambulatory Visit: Payer: Self-pay | Admitting: Cardiovascular Disease

## 2019-07-01 NOTE — Telephone Encounter (Signed)
Pt called and LM that he needs a refill on his Tricor 145mg . I don't see this as an active medication in his chart. Can you please verify if he should still be taking this medication.   Pt would like refill sent to [facility].

## 2019-07-04 MED ORDER — FENOFIBRATE 145 MG PO TABS: 145.0000 mg | ORAL_TABLET | Freq: Every day | ORAL | 3 refills | Status: DC

## 2019-07-04 NOTE — Telephone Encounter (Signed)
Spoke with patient who states he has continued to take fenofibrate 145 mg daily. He states he did not recall a time when it was discussed that he should stop it. I advised that since his most recent triglyceride level is still not at goal, that I will send the refill to his pharmacy. He verbalized understanding and thanked me for the call.

## 2019-09-05 ENCOUNTER — Telehealth: Payer: Self-pay | Admitting: Neurology

## 2019-09-05 ENCOUNTER — Other Ambulatory Visit: Payer: Self-pay

## 2019-09-05 MED ORDER — TOPIRAMATE 50 MG PO TABS: 50.0000 mg | ORAL_TABLET | Freq: Two times a day (BID) | ORAL | 3 refills | Status: DC

## 2019-09-05 NOTE — Telephone Encounter (Addendum)
1) Medication(s) Requested (by name): topiramate (TOPAMAX) 50 MG tablet   2) Pharmacy of Choice:  [facility] on [address]   Pt would like a 90 day supply

## 2019-09-05 NOTE — Telephone Encounter (Signed)
I called pt that a [facility] day [facility]. Pt stated he has not receive any notification of [facility].He stated its more cheaper to get his medications from [facility]. I stated refill will be sent to [facility] on [address]. Refill sent electronically to [facility]. Pt verbalized understanding.

## 2019-11-14 DIAGNOSIS — M25511 Pain in right shoulder: Secondary | ICD-10-CM | POA: Diagnosis not present

## 2019-11-16 ENCOUNTER — Other Ambulatory Visit: Payer: Self-pay

## 2019-11-16 ENCOUNTER — Encounter: Payer: Self-pay | Admitting: Neurology

## 2019-11-16 ENCOUNTER — Ambulatory Visit (INDEPENDENT_AMBULATORY_CARE_PROVIDER_SITE_OTHER): Payer: Medicare Other | Admitting: Neurology

## 2019-11-16 VITALS — BP 130/80 | HR 74 | Ht 71.0 in | Wt 206.0 lb

## 2019-11-16 DIAGNOSIS — G44059 Short lasting unilateral neuralgiform headache with conjunctival injection and tearing (SUNCT), not intractable: Secondary | ICD-10-CM | POA: Diagnosis not present

## 2019-11-16 MED ORDER — LAMOTRIGINE 25 MG PO TABS: 50.0000 mg | ORAL_TABLET | Freq: Two times a day (BID) | ORAL | 3 refills | Status: DC

## 2019-11-16 MED ORDER — TOPIRAMATE 50 MG PO TABS: 75.0000 mg | ORAL_TABLET | Freq: Two times a day (BID) | ORAL | 3 refills | Status: DC

## 2019-11-16 NOTE — Progress Notes (Signed)
PATIENT: Christian Ross DOB: [birth year]  REASON FOR VISIT: follow up HISTORY FROM: patient  HISTORY OF PRESENT ILLNESS: Today 11/16/19  [name] is a 79 year old male with history of diabetes and history of SUNCT headache.  He remains on Topamax 50 mg twice daily, Lamictal 50 mg twice a day.  Pain has been well controlled up until about 2 months ago, has pain above his left eyebrow with touch, such as washing his face, is otherwise okay.  Pain is level 6/10.  Had a fall recently, injured his right shoulder.  Presents today for evaluation unaccompanied.  HISTORY 11/10/2018 Dr. Jannifer Franklin: [name] is a 79 year old right-handed white male with a history of diabetes and history of SUNCT headache.  The patient was to come off of the Lamictal on the last visit but he never tapered the medication.  He remains on 50 mg twice daily.  The patient is also on 50 mg twice daily of the Topamax.  He has not had any twinges of pain whatsoever.  He recently had some adjustments to his diabetic medications secondary to elevated blood sugars.  Otherwise, he has done well medically.  REVIEW OF SYSTEMS: Out of a complete 14 system review of symptoms, the patient complains only of the following symptoms, and all other reviewed systems are negative.  Facial pain  ALLERGIES: Allergies  Allergen Reactions  . Lisinopril Cough    Causes a cough    HOME MEDICATIONS: Outpatient Medications Prior to Visit  Medication Sig Dispense Refill  . Ascorbic Acid (VITAMIN C PO) Take 3 g by mouth every morning.     Marland Kitchen aspirin 81 MG tablet Take 81 mg by mouth every evening.     . Berberine Chloride POWD Take 800 mg by mouth every morning.     . fenofibrate (TRICOR) 145 MG tablet Take 1 tablet (145 mg total) by mouth daily. 90 tablet 3  . glipiZIDE (GLUCOTROL XL) 10 MG 24 hr tablet     . Icosapent Ethyl (VASCEPA) 1 g CAPS Take 2 capsules (2 g total) by mouth 2 (two) times daily. 360 capsule 3  . Lecithin 1200 MG  CAPS Take 2 capsules by mouth every morning.    . metoprolol tartrate (LOPRESSOR) 25 MG tablet TAKE 1 TABLET TWICE A DAY 180 tablet 3  . mometasone (NASONEX) 50 MCG/ACT nasal spray Place 2 sprays into the nose daily as needed (allergies).    Marland Kitchen OVER THE COUNTER MEDICATION Take 2 tablets by mouth every morning. [place]    . Saw Palmetto, Serenoa repens, (SAW PALMETTO PO) Take by mouth.    . tobramycin-dexamethasone (TOBRADEX) ophthalmic solution SHAKE LQ AND INT 1 GTT IN OU TID    . TURMERIC PO Take by mouth.    . Ubiquinol 200 MG CAPS Take 400 mg by mouth every morning.    . lamoTRIgine (LAMICTAL) 25 MG tablet Take 2 tablets (50 mg total) by mouth 2 (two) times daily. 360 tablet 3  . topiramate (TOPAMAX) 50 MG tablet Take 1 tablet (50 mg total) by mouth 2 (two) times daily. 180 tablet 3   No facility-administered medications prior to visit.    PAST MEDICAL HISTORY: Past Medical History:  Diagnosis Date  . Allergy   . Arthritis   . Borderline diabetes    no meds / elevated HgA1C  . Cancer ([place])    skin pre cancer nose  . Cough May 2016   new x79mo, denies fever and shortness of breath  .  Diabetes mellitus without complication ([place])   . Diverticulitis   . Diverticulosis   . Erectile dysfunction   . Hemorrhoids   . History of skin cancer    basal cell removed left arm 09/07/14  . Hx of colonic polyps   . Hyperlipidemia   . Hypertension    orthostatic  . Hypertriglyceridemia   . Insulin resistance   . Rotator cuff tear    left  . Sleep apnea    recently diagnosed / has not started using c-pap yet  . 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 Vitamin D deficiency     PAST SURGICAL HISTORY: Past Surgical History:  Procedure Laterality Date  . COLONOSCOPY W/ BIOPSIES AND POLYPECTOMY  multiple, 2005, 08, 14  . [place]   right / for [place]  . NASAL SINUS SURGERY    . RECTAL BIOPSY    . SHOULDER OPEN ROTATOR CUFF REPAIR Left 09/14/2014    Procedure: LEFT SHOULDER MINI OPEN ROTATOR CUFF REPAIR AND SUBACROMIAL DECOMPRESSION ;  Surgeon: Susa Day, MD;  Location: [facility];  Service: Orthopedics;  Laterality: Left;    FAMILY HISTORY: Family History  Problem Relation Age of Onset  . Colon cancer Neg Hx   . Colon polyps Neg Hx   . Esophageal cancer Neg Hx   . Rectal cancer Neg Hx   . Stomach cancer Neg Hx     SOCIAL HISTORY: Social History   Socioeconomic History  . Marital status: Married    Spouse name: Not on file  . Number of children: 2  . Years of education: college  . Highest education level: Not on file  Occupational History  . Occupation: Futures trader: RETIRED  Tobacco Use  . Smoking status: Never Smoker  . Smokeless tobacco: Never Used  Vaping Use  . Vaping Use: Never used  Substance and Sexual Activity  . Alcohol use: Yes    Comment: rarely  . Drug use: No  . Sexual activity: Never  Other Topics Concern  . Not on file  Social History Narrative   Lives at home w/ wife   Patient drinks a couple of sodas a week.   Patient is right handed.    Social Determinants of Health   Financial Resource Strain:   . Difficulty of Paying Living Expenses:   Food Insecurity:   . Worried About Charity fundraiser in the Last Year:   . Arboriculturist in the Last Year:   Transportation Needs:   . Film/video editor (Medical):   Marland Kitchen Lack of Transportation (Non-Medical):   Physical Activity:   . Days of Exercise per Week:   . Minutes of Exercise per Session:   Stress:   . Feeling of Stress :   Social Connections:   . Frequency of Communication with Friends and Family:   . Frequency of Social Gatherings with Friends and Family:   . Attends Religious Services:   . Active Member of Clubs or Organizations:   . Attends Archivist Meetings:   Marland Kitchen Marital Status:   Intimate Partner Violence:   . Fear of Current or Ex-Partner:   . Emotionally Abused:   Marland Kitchen Physically Abused:   . Sexually Abused:     PHYSICAL EXAM  Vitals:   11/16/19 0901 11/16/19 0904  BP: (!) 152/88 (!) 130/80  Pulse: 82 74  Weight: (!) 206 lb (93.4 kg)   Height: 5\' 11"  (1.803  m)    Body mass index is 28.73 kg/m.  Generalized: Well developed, in no acute distress   Neurological examination  Mentation: Alert oriented to time, place, history taking. Follows all commands speech and language fluent Cranial nerve II-XII: Pupils were equal round reactive to light. Extraocular movements were full, visual field were full on confrontational test. Facial sensation and strength were normal. Right shoulder shrug limited. Motor: Good strength overall, limited range of motion of right shoulder due to injury Sensory: Sensory testing is intact to soft touch on all 4 extremities. No evidence of extinction is noted. Left V1 above left eyebrow sensitive to touch Coordination: Cerebellar testing reveals good finger-nose-finger and heel-to-shin bilaterally.  Gait and station: Gait is normal.    DIAGNOSTIC DATA (LABS, IMAGING, TESTING) - I reviewed patient records, labs, notes, testing and imaging myself where available.  Lab Results  Component Value Date   WBC 5.5 09/08/2014   HGB 13.5 09/08/2014   HCT 41.3 09/08/2014   MCV 99.0 09/08/2014   PLT 248 09/08/2014      Component Value Date/Time   NA 138 06/13/2019 0758   K 4.3 06/13/2019 0758   CL 106 06/13/2019 0758   CO2 16 (L) 06/13/2019 0758   GLUCOSE 184 (H) 06/13/2019 0758   GLUCOSE 108 (H) 01/15/2015 1223   BUN 16 06/13/2019 0758   CREATININE 1.42 (H) 06/13/2019 0758   CALCIUM 9.4 06/13/2019 0758   PROT 7.5 06/13/2019 0758   ALBUMIN 4.4 06/13/2019 0758   AST 23 06/13/2019 0758   ALT 29 06/13/2019 0758   ALKPHOS 56 06/13/2019 0758   BILITOT 0.3 06/13/2019 0758   GFRNONAA 48 (L) 06/13/2019 0758   GFRAA 55 (L) 06/13/2019 0758   Lab Results  Component Value Date   CHOL 188 06/13/2019   HDL 29 (L) 06/13/2019   LDLCALC 101 (H) 06/13/2019   LDLDIRECT 98.0  01/15/2015   TRIG 344 (H) 06/13/2019   CHOLHDL 6.5 (H) 06/13/2019   Lab Results  Component Value Date   HGBA1C 7.6 (H) 07/23/2016   No results found for: VITAMINB12 No results found for: TSH    ASSESSMENT AND PLAN 79 y.o. year old male  has a past medical history of Allergy, Arthritis, Borderline diabetes, Cancer ([place]), Cough (May 2016), Diabetes mellitus without complication ([place]), Diverticulitis, Diverticulosis, Erectile dysfunction, Hemorrhoids, History of skin cancer, colonic polyps, Hyperlipidemia, Hypertension, Hypertriglyceridemia, Insulin resistance, Rotator cuff tear, Sleep apnea, SUNCT (05/14/12), and Vitamin D deficiency. here with:  1. SUNCT headaches  He has had some return of pain with touch.  I will increase Topamax to 75 mg twice daily, continue Lamictal 50 mg twice a day.  He will call for dose adjustment, we can go up on the Topamax to 100 mg twice a day.  At one point, he was taking 150 mg twice daily.  He will follow-up in 6 months or sooner if needed.  I spent 20 minutes of face-to-face and non-face-to-face time with patient.  This included previsit chart review, lab review, study review, order entry, electronic health record documentation, patient education.  Butler Denmark, AGNP-C, DNP 11/16/2019, 9:25 AM [facility] [address], [place], [place] [address] [phone]

## 2019-11-16 NOTE — Progress Notes (Signed)
I have read the note, and I agree with the clinical assessment and plan.  Jermika Olden K Tomoya Ringwald   

## 2019-11-16 NOTE — Patient Instructions (Signed)
Increase Topamax 75 mg twice daily  Continue Lamictal  See you back in 6 months

## 2019-11-21 DIAGNOSIS — G4733 Obstructive sleep apnea (adult) (pediatric): Secondary | ICD-10-CM | POA: Diagnosis not present

## 2019-11-22 DIAGNOSIS — I10 Essential (primary) hypertension: Secondary | ICD-10-CM | POA: Diagnosis not present

## 2019-11-22 DIAGNOSIS — E785 Hyperlipidemia, unspecified: Secondary | ICD-10-CM | POA: Diagnosis not present

## 2019-11-22 DIAGNOSIS — M25511 Pain in right shoulder: Secondary | ICD-10-CM | POA: Diagnosis not present

## 2019-11-22 DIAGNOSIS — N183 Chronic kidney disease, stage 3 unspecified: Secondary | ICD-10-CM | POA: Diagnosis not present

## 2019-11-22 DIAGNOSIS — E1169 Type 2 diabetes mellitus with other specified complication: Secondary | ICD-10-CM | POA: Diagnosis not present

## 2019-11-23 ENCOUNTER — Telehealth: Payer: Self-pay | Admitting: Neurology

## 2019-11-23 MED ORDER — TOPIRAMATE 50 MG PO TABS: 100.0000 mg | ORAL_TABLET | Freq: Two times a day (BID) | ORAL | 3 refills | Status: DC

## 2019-11-23 NOTE — Telephone Encounter (Signed)
Events noted. Will increase the dosing prescription.

## 2019-11-23 NOTE — Telephone Encounter (Signed)
Pt has called to report that the twice a day 75mg  of the topiramate   Did not help, pt is now going to increase to the 100mg  twice a day of the topiramate.  This is FYI pt has not asked for a call back

## 2019-11-23 NOTE — Addendum Note (Signed)
Addended by: Suzzanne Cloud on: 11/23/2019 02:50 PM   Modules accepted: Orders

## 2019-11-29 DIAGNOSIS — M25511 Pain in right shoulder: Secondary | ICD-10-CM | POA: Diagnosis not present

## 2019-12-02 DIAGNOSIS — I10 Essential (primary) hypertension: Secondary | ICD-10-CM | POA: Diagnosis not present

## 2019-12-02 DIAGNOSIS — Z01818 Encounter for other preprocedural examination: Secondary | ICD-10-CM | POA: Diagnosis not present

## 2019-12-02 DIAGNOSIS — M75101 Unspecified rotator cuff tear or rupture of right shoulder, not specified as traumatic: Secondary | ICD-10-CM | POA: Diagnosis not present

## 2019-12-09 ENCOUNTER — Encounter (HOSPITAL_COMMUNITY): Payer: Self-pay

## 2019-12-09 ENCOUNTER — Other Ambulatory Visit: Payer: Self-pay

## 2019-12-09 ENCOUNTER — Encounter (HOSPITAL_COMMUNITY)
Admission: RE | Admit: 2019-12-09 | Discharge: 2019-12-09 | Disposition: A | Discharge status: Home / Self Care | Payer: Medicare Other | Source: Ambulatory Visit | Attending: Specialist | Admitting: Specialist

## 2019-12-09 DIAGNOSIS — Z01812 Encounter for preprocedural laboratory examination: Secondary | ICD-10-CM | POA: Diagnosis not present

## 2019-12-09 LAB — CBC
HCT: 44.2 % (ref 39.0–52.0)
Hemoglobin: 14.5 g/dL (ref 13.0–17.0)
MCH: 32.6 pg (ref 26.0–34.0)
MCHC: 32.8 g/dL (ref 30.0–36.0)
MCV: 99.3 fL (ref 80.0–100.0)
Platelets: 267 10*3/uL (ref 150–400)
RBC: 4.45 MIL/uL (ref 4.22–5.81)
RDW: 13.3 % (ref 11.5–15.5)
WBC: 6.4 10*3/uL (ref 4.0–10.5)
nRBC: 0 % (ref 0.0–0.2)

## 2019-12-09 LAB — BASIC METABOLIC PANEL
Anion gap: 10 (ref 5–15)
BUN: 32 mg/dL — ABNORMAL HIGH (ref 8–23)
CO2: 20 mmol/L — ABNORMAL LOW (ref 22–32)
Calcium: 9.6 mg/dL (ref 8.9–10.3)
Chloride: 108 mmol/L (ref 98–111)
Creatinine, Ser: 1.24 mg/dL (ref 0.61–1.24)
GFR calc Af Amer: >60 mL/min (ref >60)
GFR calc non Af Amer: 56 mL/min — ABNORMAL LOW (ref >60)
Glucose, Bld: 146 mg/dL — ABNORMAL HIGH (ref 70–99)
Potassium: 4.4 mmol/L (ref 3.5–5.1)
Sodium: 138 mmol/L (ref 135–145)

## 2019-12-09 NOTE — Progress Notes (Signed)
BMP done 12/09/19 faxed via epic to Dr Tonita Cong.

## 2019-12-09 NOTE — Progress Notes (Signed)
DUE TO COVID-19 ONLY ONE VISITOR IS ALLOWED TO COME WITH YOU AND STAY IN THE WAITING ROOM ONLY DURING PRE OP AND PROCEDURE DAY OF SURGERY. THE 1 VISITOR  MAY VISIT WITH YOU AFTER SURGERY IN YOUR PRIVATE ROOM DURING VISITING HOURS ONLY!  YOU NEED TO HAVE A COVID 19 TEST ON_8/21/21 ______ @_______ , THIS TEST MUST BE DONE 0900am BEFORE SURGERY,  COVID TESTING SITE [address]  [id number], IT IS ON THE RIGHT GOING OUT [address] APPROXIMATELY  2 MINUTES PAST [employer] ON THE RIGHT. ONCE YOUR COVID TEST IS COMPLETED,  PLEASE BEGIN THE QUARANTINE INSTRUCTIONS AS OUTLINED IN YOUR HANDOUT.                Christian Ross  12/09/2019   Your procedure is scheduled on:       12/14/2019   Report to [facility] Main  Entrance   Report to admitting at     0800 AM     Call this number if you have problems the morning of surgery [phone]    Remember: Do not eat food , candy gum or mints :After Midnight. You may have clear liquids from midnight until  0700am     CLEAR LIQUID DIET   Foods Allowed                                                                       Coffee and tea, regular and decaf                              Plain Jell-O any favor except red or purple                                            Fruit ices (not with fruit pulp)                                      Iced Popsicles                                     Carbonated beverages, regular and diet                                    Cranberry, grape and apple juices Sports drinks like Gatorade Lightly seasoned clear broth or consume(fat free) Sugar, honey syrup   _____________________________________________________________________    BRUSH YOUR TEETH MORNING OF SURGERY AND RINSE YOUR MOUTH OUT, NO CHEWING GUM CANDY OR MINTS.     Take these medicines the morning of surgery with A SIP OF WATER:  Lamictal, metoprolol, topamax  DO NOT TAKE ANY DIABETIC MEDICATIONS DAY OF  YOUR SURGERY  You may not have any metal on your body including hair pins and              piercings  Do not wear jewelry, make-up, lotions, powders or perfumes, deodorant             Do not wear nail polish on your fingernails.  Do not shave  48 hours prior to surgery.              Men may shave face and neck.   Do not bring valuables to the hospital. [place].  Contacts, dentures or bridgework may not be worn into surgery.  Leave suitcase in the car. After surgery it may be brought to your room.     Patients discharged the day of surgery will not be allowed to drive home. IF YOU ARE HAVING SURGERY AND GOING HOME THE SAME DAY, YOU MUST HAVE AN ADULT TO DRIVE YOU HOME AND BE WITH YOU FOR 24 HOURS. YOU MAY GO HOME BY TAXI OR UBER OR ORTHERWISE, BUT AN ADULT MUST ACCOMPANY YOU HOME AND STAY WITH YOU FOR 24 HOURS.  Name and phone number of your driver:  Special Instructions: N/A              Please read over the following fact sheets you were given: _____________________________________________________________________  [facility] - Preparing for Surgery Before surgery, you can play an important role.  Because skin is not sterile, your skin needs to be as free of germs as possible.  You can reduce the number of germs on your skin by washing with CHG (chlorahexidine gluconate) soap before surgery.  CHG is an antiseptic cleaner which kills germs and bonds with the skin to continue killing germs even after washing. Please DO NOT use if you have an allergy to CHG or antibacterial soaps.  If your skin becomes reddened/irritated stop using the CHG and inform your nurse when you arrive at Short Stay. Do not shave (including legs and underarms) for at least 48 hours prior to the first CHG shower.  You may shave your face/neck. Please follow these instructions carefully:  1.  Shower with CHG Soap the night before surgery and  the  morning of Surgery.  2.  If you choose to wash your hair, wash your hair first as usual with your  normal  shampoo.  3.  After you shampoo, rinse your hair and body thoroughly to remove the  shampoo.                           4.  Use CHG as you would any other liquid soap.  You can apply chg directly  to the skin and wash                       Gently with a scrungie or clean washcloth.  5.  Apply the CHG Soap to your body ONLY FROM THE NECK DOWN.   Do not use on face/ open                           Wound or open sores. Avoid contact with eyes, ears mouth and genitals (private parts).  Wash face,  Genitals (private parts) with your normal soap.             6.  Wash thoroughly, paying special attention to the area where your surgery  will be performed.  7.  Thoroughly rinse your body with warm water from the neck down.  8.  DO NOT shower/wash with your normal soap after using and rinsing off  the CHG Soap.                9.  Pat yourself dry with a clean towel.            10.  Wear clean pajamas.            11.  Place clean sheets on your bed the night of your first shower and do not  sleep with pets. Day of Surgery : Do not apply any lotions/deodorants the morning of surgery.  Please wear clean clothes to the hospital/surgery center.  FAILURE TO FOLLOW THESE INSTRUCTIONS MAY RESULT IN THE CANCELLATION OF YOUR SURGERY PATIENT SIGNATURE_________________________________  NURSE SIGNATURE__________________________________  ________________________________________________________________________

## 2019-12-10 ENCOUNTER — Other Ambulatory Visit (HOSPITAL_COMMUNITY)
Admission: RE | Admit: 2019-12-10 | Discharge: 2019-12-10 | Disposition: A | Discharge status: Home / Self Care | Payer: Medicare Other | Source: Ambulatory Visit | Attending: Specialist | Admitting: Specialist

## 2019-12-10 DIAGNOSIS — Z01812 Encounter for preprocedural laboratory examination: Secondary | ICD-10-CM | POA: Insufficient documentation

## 2019-12-10 DIAGNOSIS — Z20822 Contact with and (suspected) exposure to covid-19: Secondary | ICD-10-CM | POA: Insufficient documentation

## 2019-12-10 LAB — SARS CORONAVIRUS 2 (TAT 6-24 HRS): SARS Coronavirus 2: NEGATIVE

## 2019-12-12 ENCOUNTER — Ambulatory Visit: Payer: Self-pay | Admitting: Orthopedic Surgery

## 2019-12-12 NOTE — H&P (View-Only) (Signed)
Christian Ross is an 79 y.o. male.   Chief Complaint: R shoulder pain HPI: Reported by patient. Visit For: Follow up (to discuss MRI) Location: right; shoulder Duration: 4 weeks Context: fall  Past Medical History:  Diagnosis Date  . Allergy   . Arthritis   . Borderline diabetes    no meds / elevated HgA1C  . Cancer ([place])    skin pre cancer nose  . Cough May 2016   new x52mo, denies fever and shortness of breath  . Diabetes mellitus without complication ([place])    type 2   . Diverticulitis   . Diverticulosis   . Erectile dysfunction   . Hemorrhoids   . History of skin cancer    basal cell removed left arm 09/07/14  . Hx of colonic polyps   . Hyperlipidemia   . Hypertension    orthostatic  . Hypertriglyceridemia   . Insulin resistance   . Rotator cuff tear    left  . Sleep apnea    cpap setting at 6   . 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 Vitamin D deficiency     Past Surgical History:  Procedure Laterality Date  . COLONOSCOPY W/ BIOPSIES AND POLYPECTOMY  multiple, 2005, 08, 14  . [place]   right / for [place]  . NASAL SINUS SURGERY    . RECTAL BIOPSY    . SHOULDER OPEN ROTATOR CUFF REPAIR Left 09/14/2014   Procedure: LEFT SHOULDER MINI OPEN ROTATOR CUFF REPAIR AND SUBACROMIAL DECOMPRESSION ;  Surgeon: Susa Day, MD;  Location: [facility];  Service: Orthopedics;  Laterality: Left;    Family History  Problem Relation Age of Onset  . Colon cancer Neg Hx   . Colon polyps Neg Hx   . Esophageal cancer Neg Hx   . Rectal cancer Neg Hx   . Stomach cancer Neg Hx    Social History:  reports that he has never smoked. He has never used smokeless tobacco. He reports current alcohol use. He reports that he does not use drugs.  Allergies:  Allergies  Allergen Reactions  . Lisinopril Cough    (Not in a hospital admission)   No results found for this or any previous visit (from the past 48 hour(s)). No results found.  Review of  Systems  Constitutional: Negative.   HENT: Negative.   Eyes: Negative.   Respiratory: Negative.   Cardiovascular: Negative.   Gastrointestinal: Negative.   Endocrine: Negative.   Genitourinary: Negative.   Musculoskeletal: Positive for arthralgias.  Neurological: Negative.     There were no vitals taken for this visit. Physical Exam Constitutional:      Appearance: Normal appearance.  HENT:     Head: Normocephalic.     Right Ear: External ear normal.     Left Ear: External ear normal.     Nose: Nose normal.     Mouth/Throat:     Pharynx: Oropharynx is clear.  Cardiovascular:     Rate and Rhythm: Normal rate and regular rhythm.     Pulses: Normal pulses.     Heart sounds: Normal heart sounds.  Pulmonary:     Effort: Pulmonary effort is normal.     Breath sounds: Normal breath sounds.  Abdominal:     Palpations: Abdomen is soft.  Musculoskeletal:     Cervical back: Normal range of motion.     Comments: Patient is a 79 year old male.  Constitutional: General Appearance: healthy-appearing and NAD.  Psychiatric: Mood and Affect: normal mood and affect.  Cardiovascular System: Arterial Pulses Right: radial normal and brachial normal. Varicosities Right: no varicosities.  C-Spine/Neck: Active Range of Motion: flexion normal, extension normal, and no pain elicited on motion.  Shoulders: Inspection Right: no misalignment, atrophy, erythema, swelling, or scapular winging. Bony Palpation Right: no tenderness of the sternoclavicular joint, the coracoid process, the acromioclavicular joint, the bicipital groove, or the scapula. Soft Tissue Palpation Right: tenderness of the supraspinatus and the subacromial bursa. Active Range of Motion Right: limited. Special Tests Right: Speed's test negative and Neer's test positive. Stability Right: no laxity, sulcus sign negative, and anterior apprehension test negative. Strength Right: abduction 5/5, adduction 5/5, flexion 5/5, and extension  5/5.  Skin: Right Upper Extremity: normal.  Neurological System: Biceps Reflex Right: normal (2). Brachioradialis Reflex Right: normal (2). Triceps Reflex Right: normal (2). Sensation on the Right: C5 normal, C6 normal, and C7 normal.  Unable to actively abduct  Neurological:     Mental Status: He is alert.    Massive retracted tear of the rotator cuff is noted both supraspinatus and infraspinatus.  Assessment/Plan Impression:  Patient demonstrates acute massive retracted tear of the supraspinatus infraspinatus  Plan:  We discussed repair it is a significant retraction although this is fairly acute and he was doing well prior to that. We discussed repair.  An extensive discussion concerning the pathology relevant anatomy and treatment options. After that discussion we mutually agreed to proceed with repair of the rotator cuff utilizing arthroscopic assistance if possible. The risks and benefits of that procedure were discussed including bleeding, infection, suboptimal range of motion, deep venous thrombosis, pulmonary embolism, anesthetic complications etc. in addition we discussed the postoperative course to include approximately 4 weeks of passive range of motion followed by 4 weeks of active range of motion followed by 4-12 weeks of progressive strengthening exercises. In addition we discussed protective activities to reduce the risk of a reinjury including impingement activities with elbow above the shoulder as well as reaching and repetitive circular motion activities. The hospital stay will either be as a outpatient with a regional block versus overnight depending upon the extent of the procedure and any challenging health issues with a first postoperative visit 2 weeks following the surgery.   We discussed the possibility of inability to repair this tendon. Possible patch graft. Possible need for hemiarthroplasty in the future   Plan R shoulder mini-open RCR, SAD, possible patch  graft  Cecilie Kicks, PA-C for Dr. Tonita Cong 12/12/2019, 12:39 PM

## 2019-12-12 NOTE — H&P (Signed)
Christian Ross is an 79 y.o. male.   Chief Complaint: R shoulder pain HPI: Reported by patient. Visit For: Follow up (to discuss MRI) Location: right; shoulder Duration: 4 weeks Context: fall  Past Medical History:  Diagnosis Date  . Allergy   . Arthritis   . Borderline diabetes    no meds / elevated HgA1C  . Cancer ([place])    skin pre cancer nose  . Cough May 2016   new x35mo, denies fever and shortness of breath  . Diabetes mellitus without complication ([place])    type 2   . Diverticulitis   . Diverticulosis   . Erectile dysfunction   . Hemorrhoids   . History of skin cancer    basal cell removed left arm 09/07/14  . Hx of colonic polyps   . Hyperlipidemia   . Hypertension    orthostatic  . Hypertriglyceridemia   . Insulin resistance   . Rotator cuff tear    left  . Sleep apnea    cpap setting at 6   . 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 Vitamin D deficiency     Past Surgical History:  Procedure Laterality Date  . COLONOSCOPY W/ BIOPSIES AND POLYPECTOMY  multiple, 2005, 08, 14  . [place]   right / for [place]  . NASAL SINUS SURGERY    . RECTAL BIOPSY    . SHOULDER OPEN ROTATOR CUFF REPAIR Left 09/14/2014   Procedure: LEFT SHOULDER MINI OPEN ROTATOR CUFF REPAIR AND SUBACROMIAL DECOMPRESSION ;  Surgeon: Susa Day, MD;  Location: [facility];  Service: Orthopedics;  Laterality: Left;    Family History  Problem Relation Age of Onset  . Colon cancer Neg Hx   . Colon polyps Neg Hx   . Esophageal cancer Neg Hx   . Rectal cancer Neg Hx   . Stomach cancer Neg Hx    Social History:  reports that he has never smoked. He has never used smokeless tobacco. He reports current alcohol use. He reports that he does not use drugs.  Allergies:  Allergies  Allergen Reactions  . Lisinopril Cough    (Not in a hospital admission)   No results found for this or any previous visit (from the past 48 hour(s)). No results found.  Review of  Systems  Constitutional: Negative.   HENT: Negative.   Eyes: Negative.   Respiratory: Negative.   Cardiovascular: Negative.   Gastrointestinal: Negative.   Endocrine: Negative.   Genitourinary: Negative.   Musculoskeletal: Positive for arthralgias.  Neurological: Negative.     There were no vitals taken for this visit. Physical Exam Constitutional:      Appearance: Normal appearance.  HENT:     Head: Normocephalic.     Right Ear: External ear normal.     Left Ear: External ear normal.     Nose: Nose normal.     Mouth/Throat:     Pharynx: Oropharynx is clear.  Cardiovascular:     Rate and Rhythm: Normal rate and regular rhythm.     Pulses: Normal pulses.     Heart sounds: Normal heart sounds.  Pulmonary:     Effort: Pulmonary effort is normal.     Breath sounds: Normal breath sounds.  Abdominal:     Palpations: Abdomen is soft.  Musculoskeletal:     Cervical back: Normal range of motion.     Comments: Patient is a 79 year old male.  Constitutional: General Appearance: healthy-appearing and NAD.  Psychiatric: Mood and Affect: normal mood and affect.  Cardiovascular System: Arterial Pulses Right: radial normal and brachial normal. Varicosities Right: no varicosities.  C-Spine/Neck: Active Range of Motion: flexion normal, extension normal, and no pain elicited on motion.  Shoulders: Inspection Right: no misalignment, atrophy, erythema, swelling, or scapular winging. Bony Palpation Right: no tenderness of the sternoclavicular joint, the coracoid process, the acromioclavicular joint, the bicipital groove, or the scapula. Soft Tissue Palpation Right: tenderness of the supraspinatus and the subacromial bursa. Active Range of Motion Right: limited. Special Tests Right: Speed's test negative and Neer's test positive. Stability Right: no laxity, sulcus sign negative, and anterior apprehension test negative. Strength Right: abduction 5/5, adduction 5/5, flexion 5/5, and extension  5/5.  Skin: Right Upper Extremity: normal.  Neurological System: Biceps Reflex Right: normal (2). Brachioradialis Reflex Right: normal (2). Triceps Reflex Right: normal (2). Sensation on the Right: C5 normal, C6 normal, and C7 normal.  Unable to actively abduct  Neurological:     Mental Status: He is alert.    Massive retracted tear of the rotator cuff is noted both supraspinatus and infraspinatus.  Assessment/Plan Impression:  Patient demonstrates acute massive retracted tear of the supraspinatus infraspinatus  Plan:  We discussed repair it is a significant retraction although this is fairly acute and he was doing well prior to that. We discussed repair.  An extensive discussion concerning the pathology relevant anatomy and treatment options. After that discussion we mutually agreed to proceed with repair of the rotator cuff utilizing arthroscopic assistance if possible. The risks and benefits of that procedure were discussed including bleeding, infection, suboptimal range of motion, deep venous thrombosis, pulmonary embolism, anesthetic complications etc. in addition we discussed the postoperative course to include approximately 4 weeks of passive range of motion followed by 4 weeks of active range of motion followed by 4-12 weeks of progressive strengthening exercises. In addition we discussed protective activities to reduce the risk of a reinjury including impingement activities with elbow above the shoulder as well as reaching and repetitive circular motion activities. The hospital stay will either be as a outpatient with a regional block versus overnight depending upon the extent of the procedure and any challenging health issues with a first postoperative visit 2 weeks following the surgery.   We discussed the possibility of inability to repair this tendon. Possible patch graft. Possible need for hemiarthroplasty in the future   Plan R shoulder mini-open RCR, SAD, possible patch  graft  Cecilie Kicks, PA-C for Dr. Tonita Cong 12/12/2019, 12:39 PM

## 2019-12-12 NOTE — Progress Notes (Signed)
rerequested most recent hgba1C and clearance note from office of DR Orland Mustard at [facility].

## 2019-12-13 ENCOUNTER — Encounter (HOSPITAL_COMMUNITY): Payer: Self-pay | Admitting: Specialist

## 2019-12-13 NOTE — Progress Notes (Signed)
Anesthesia Review:  PCP: DR Tina Griffiths- LOV- 11/22/19 on chart with HGBA1c- results- oon chart  6.8 Cardiologist : Chest x-ray : EKG :06/15/19 Echo : Stress test: Cardiac Cath :  Activity level:  Sleep Study/ CPAP : Fasting Blood Sugar :      / Checks Blood Sugar -- times a day:   Blood Thinner/ Instructions /Last Dose: ASA / Instructions/ Last Dose :

## 2019-12-14 ENCOUNTER — Encounter (HOSPITAL_COMMUNITY): Payer: Self-pay | Admitting: Specialist

## 2019-12-14 ENCOUNTER — Ambulatory Visit (HOSPITAL_COMMUNITY)
Admission: RE | Admit: 2019-12-14 | Discharge: 2019-12-14 | Disposition: A | Discharge status: Home / Self Care | Payer: Medicare Other | Attending: Specialist | Admitting: Specialist

## 2019-12-14 ENCOUNTER — Ambulatory Visit (HOSPITAL_COMMUNITY): Payer: Medicare Other | Admitting: Anesthesiology

## 2019-12-14 ENCOUNTER — Encounter (HOSPITAL_COMMUNITY)
Admission: RE | Disposition: A | Discharge status: Home / Self Care | Payer: Self-pay | Source: Home / Self Care | Attending: Specialist

## 2019-12-14 ENCOUNTER — Ambulatory Visit (HOSPITAL_COMMUNITY): Payer: Medicare Other | Admitting: Physician Assistant

## 2019-12-14 ENCOUNTER — Other Ambulatory Visit: Payer: Self-pay

## 2019-12-14 DIAGNOSIS — R519 Headache, unspecified: Secondary | ICD-10-CM | POA: Diagnosis not present

## 2019-12-14 DIAGNOSIS — G4730 Sleep apnea, unspecified: Secondary | ICD-10-CM | POA: Insufficient documentation

## 2019-12-14 DIAGNOSIS — E119 Type 2 diabetes mellitus without complications: Secondary | ICD-10-CM | POA: Insufficient documentation

## 2019-12-14 DIAGNOSIS — E785 Hyperlipidemia, unspecified: Secondary | ICD-10-CM | POA: Diagnosis not present

## 2019-12-14 DIAGNOSIS — Z86010 Personal history of colonic polyps: Secondary | ICD-10-CM | POA: Insufficient documentation

## 2019-12-14 DIAGNOSIS — M1990 Unspecified osteoarthritis, unspecified site: Secondary | ICD-10-CM | POA: Diagnosis not present

## 2019-12-14 DIAGNOSIS — E8881 Metabolic syndrome: Secondary | ICD-10-CM | POA: Diagnosis not present

## 2019-12-14 DIAGNOSIS — Z85828 Personal history of other malignant neoplasm of skin: Secondary | ICD-10-CM | POA: Diagnosis not present

## 2019-12-14 DIAGNOSIS — I10 Essential (primary) hypertension: Secondary | ICD-10-CM | POA: Insufficient documentation

## 2019-12-14 DIAGNOSIS — Z888 Allergy status to other drugs, medicaments and biological substances status: Secondary | ICD-10-CM | POA: Insufficient documentation

## 2019-12-14 DIAGNOSIS — W19XXXA Unspecified fall, initial encounter: Secondary | ICD-10-CM | POA: Diagnosis not present

## 2019-12-14 DIAGNOSIS — E559 Vitamin D deficiency, unspecified: Secondary | ICD-10-CM | POA: Insufficient documentation

## 2019-12-14 DIAGNOSIS — S46011A Strain of muscle(s) and tendon(s) of the rotator cuff of right shoulder, initial encounter: Secondary | ICD-10-CM | POA: Diagnosis not present

## 2019-12-14 DIAGNOSIS — Z7984 Long term (current) use of oral hypoglycemic drugs: Secondary | ICD-10-CM | POA: Diagnosis not present

## 2019-12-14 DIAGNOSIS — K5790 Diverticulosis of intestine, part unspecified, without perforation or abscess without bleeding: Secondary | ICD-10-CM | POA: Diagnosis not present

## 2019-12-14 DIAGNOSIS — E781 Pure hyperglyceridemia: Secondary | ICD-10-CM | POA: Diagnosis not present

## 2019-12-14 DIAGNOSIS — S43421A Sprain of right rotator cuff capsule, initial encounter: Secondary | ICD-10-CM | POA: Diagnosis not present

## 2019-12-14 DIAGNOSIS — G8918 Other acute postprocedural pain: Secondary | ICD-10-CM | POA: Diagnosis not present

## 2019-12-14 DIAGNOSIS — M75101 Unspecified rotator cuff tear or rupture of right shoulder, not specified as traumatic: Secondary | ICD-10-CM | POA: Diagnosis not present

## 2019-12-14 HISTORY — PX: SHOULDER ARTHROSCOPY WITH ROTATOR CUFF REPAIR AND SUBACROMIAL DECOMPRESSION: SHX5686

## 2019-12-14 LAB — GLUCOSE, CAPILLARY
Glucose-Capillary: 157 mg/dL — ABNORMAL HIGH (ref 70–99)
Glucose-Capillary: 115 mg/dL — ABNORMAL HIGH (ref 70–99)

## 2019-12-14 SURGERY — SHOULDER ARTHROSCOPY WITH ROTATOR CUFF REPAIR AND SUBACROMIAL DECOMPRESSION
Anesthesia: General | Site: Shoulder | Laterality: Right

## 2019-12-14 MED ORDER — ORAL CARE MOUTH RINSE: 15.0000 mL | Freq: Once | OROMUCOSAL | Status: AC

## 2019-12-14 MED ORDER — OXYCODONE HCL 5 MG/5ML PO SOLN: 5.0000 mg | Freq: Once | ORAL | Status: DC | PRN

## 2019-12-14 MED ORDER — BUPIVACAINE LIPOSOME 1.3 % IJ SUSP
INTRAMUSCULAR | Status: DC | PRN
  Administered 2019-12-14: 10 mL via PERINEURAL

## 2019-12-14 MED ORDER — PROMETHAZINE HCL 25 MG/ML IJ SOLN: 6.2500 mg | INTRAMUSCULAR | Status: DC | PRN

## 2019-12-14 MED ORDER — ACETAMINOPHEN 500 MG PO TABS
1000.0000 mg | ORAL_TABLET | Freq: Once | ORAL | Status: AC
  Administered 2019-12-14: 1000 mg via ORAL
  Filled 2019-12-14: qty 2

## 2019-12-14 MED ORDER — ROCURONIUM BROMIDE 10 MG/ML (PF) SYRINGE
PREFILLED_SYRINGE | INTRAVENOUS | Status: DC | PRN
  Administered 2019-12-14: 60 mg via INTRAVENOUS

## 2019-12-14 MED ORDER — CEFAZOLIN SODIUM-DEXTROSE 2-4 GM/100ML-% IV SOLN
2.0000 g | INTRAVENOUS | Status: AC
  Administered 2019-12-14: 2 g via INTRAVENOUS
  Filled 2019-12-14: qty 100

## 2019-12-14 MED ORDER — PROPOFOL 10 MG/ML IV BOLUS
INTRAVENOUS | Status: DC | PRN
  Administered 2019-12-14: 160 mg via INTRAVENOUS

## 2019-12-14 MED ORDER — FENTANYL CITRATE (PF) 100 MCG/2ML IJ SOLN
50.0000 ug | INTRAMUSCULAR | Status: DC
  Administered 2019-12-14: 50 ug via INTRAVENOUS
  Filled 2019-12-14: qty 2

## 2019-12-14 MED ORDER — CHLORHEXIDINE GLUCONATE 0.12 % MT SOLN
15.0000 mL | Freq: Once | OROMUCOSAL | Status: AC
  Administered 2019-12-14: 15 mL via OROMUCOSAL

## 2019-12-14 MED ORDER — SODIUM CHLORIDE 0.9 % IV SOLN
INTRAVENOUS | Status: AC
  Filled 2019-12-14: qty 500000

## 2019-12-14 MED ORDER — DEXAMETHASONE SODIUM PHOSPHATE 10 MG/ML IJ SOLN
INTRAMUSCULAR | Status: DC | PRN
  Administered 2019-12-14: 4 mg via INTRAVENOUS

## 2019-12-14 MED ORDER — SUGAMMADEX SODIUM 200 MG/2ML IV SOLN
INTRAVENOUS | Status: DC | PRN
  Administered 2019-12-14: 200 mg via INTRAVENOUS

## 2019-12-14 MED ORDER — DOCUSATE SODIUM 100 MG PO CAPS: 100.0000 mg | ORAL_CAPSULE | Freq: Two times a day (BID) | ORAL | 2 refills | Status: DC

## 2019-12-14 MED ORDER — BUPIVACAINE-EPINEPHRINE (PF) 0.5% -1:200000 IJ SOLN
INTRAMUSCULAR | Status: AC
  Filled 2019-12-14: qty 30

## 2019-12-14 MED ORDER — POLYETHYLENE GLYCOL 3350 17 G PO PACK: 17.0000 g | PACK | Freq: Every day | ORAL | 0 refills | Status: DC

## 2019-12-14 MED ORDER — ONDANSETRON HCL 4 MG/2ML IJ SOLN
INTRAMUSCULAR | Status: DC | PRN
  Administered 2019-12-14: 4 mg via INTRAVENOUS

## 2019-12-14 MED ORDER — SODIUM CHLORIDE 0.9 % IV SOLN
INTRAVENOUS | Status: DC | PRN
  Administered 2019-12-14: 500 mL

## 2019-12-14 MED ORDER — BUPIVACAINE-EPINEPHRINE (PF) 0.5% -1:200000 IJ SOLN
INTRAMUSCULAR | Status: DC | PRN
  Administered 2019-12-14: 15 mL via PERINEURAL

## 2019-12-14 MED ORDER — LACTATED RINGERS IV SOLN: INTRAVENOUS | Status: DC

## 2019-12-14 MED ORDER — OXYCODONE HCL 5 MG PO TABS: 5.0000 mg | ORAL_TABLET | Freq: Once | ORAL | Status: DC | PRN

## 2019-12-14 MED ORDER — PROPOFOL 1000 MG/100ML IV EMUL
INTRAVENOUS | Status: AC
  Filled 2019-12-14: qty 100

## 2019-12-14 MED ORDER — FENTANYL CITRATE (PF) 100 MCG/2ML IJ SOLN: 25.0000 ug | INTRAMUSCULAR | Status: DC | PRN

## 2019-12-14 MED ORDER — BUPIVACAINE-EPINEPHRINE 0.5% -1:200000 IJ SOLN
INTRAMUSCULAR | Status: DC | PRN
  Administered 2019-12-14: 10 mL

## 2019-12-14 MED ORDER — MIDAZOLAM HCL 2 MG/2ML IJ SOLN
1.0000 mg | INTRAMUSCULAR | Status: DC
  Filled 2019-12-14: qty 2

## 2019-12-14 MED ORDER — PROPOFOL 10 MG/ML IV BOLUS
INTRAVENOUS | Status: AC
  Filled 2019-12-14: qty 20

## 2019-12-14 MED ORDER — CEPHALEXIN 500 MG PO CAPS: 500.0000 mg | ORAL_CAPSULE | Freq: Four times a day (QID) | ORAL | 1 refills | Status: DC

## 2019-12-14 MED ORDER — PHENYLEPHRINE HCL-NACL 10-0.9 MG/250ML-% IV SOLN
INTRAVENOUS | Status: DC | PRN
  Administered 2019-12-14: 35 ug/min via INTRAVENOUS

## 2019-12-14 MED ORDER — LIDOCAINE 2% (20 MG/ML) 5 ML SYRINGE
INTRAMUSCULAR | Status: DC | PRN
  Administered 2019-12-14: 100 mg via INTRAVENOUS

## 2019-12-14 MED ORDER — OXYCODONE-ACETAMINOPHEN 5-325 MG PO TABS
1.0000 | ORAL_TABLET | ORAL | 0 refills | Status: DC | PRN
Start: 2019-12-14 — End: 2020-07-30

## 2019-12-14 SURGICAL SUPPLY — 75 items
AID PSTN UNV HD RSTRNT DISP (MISCELLANEOUS) ×1
ANCH SUT 2 19.1 W/FIBERTAPE (Anchor) ×1 IMPLANT
ANCH SUT 2 SWLK 19.1 CLS EYLT (Anchor) ×1 IMPLANT
ANCHOR NDL 9/16 CIR SZ 8 (NEEDLE) IMPLANT
ANCHOR NEEDLE 9/16 CIR SZ 8 (NEEDLE) ×3 IMPLANT
ANCHOR SL BIO 4.75 W/FIBERTAPE (Anchor) ×2 IMPLANT
ANCHOR SWIVELOCK BIO 4.75X19.1 (Anchor) ×2 IMPLANT
BAG DECANTER FOR FLEXI CONT (MISCELLANEOUS) ×2 IMPLANT
BLADE EXCALIBUR 4.0MM X 13CM (MISCELLANEOUS)
BLADE EXCALIBUR 4.0X13 (MISCELLANEOUS) ×1 IMPLANT
BLADE SURG SZ11 CARB STEEL (BLADE) ×1 IMPLANT
CANNULA ACUFO 5X76 (CANNULA) ×1 IMPLANT
CLEANER TIP ELECTROSURG 2X2 (MISCELLANEOUS) IMPLANT
CLOSURE WOUND 1/2 X4 (GAUZE/BANDAGES/DRESSINGS) ×1
COVER SURGICAL LIGHT HANDLE (MISCELLANEOUS) ×3 IMPLANT
COVER WAND RF STERILE (DRAPES) IMPLANT
DECANTER SPIKE VIAL GLASS SM (MISCELLANEOUS) ×2 IMPLANT
DISSECTOR  3.8MM X 13CM (MISCELLANEOUS)
DISSECTOR 3.5MM X 13CM (MISCELLANEOUS) IMPLANT
DISSECTOR 3.8MM X 13CM (MISCELLANEOUS) IMPLANT
DRAPE POUCH INSTRU U-SHP 10X18 (DRAPES) ×1 IMPLANT
DRAPE STERI 35X30 U-POUCH (DRAPES) ×3 IMPLANT
DRSG AQUACEL AG ADV 3.5X 4 (GAUZE/BANDAGES/DRESSINGS) ×2 IMPLANT
DRSG AQUACEL AG ADV 3.5X 6 (GAUZE/BANDAGES/DRESSINGS) IMPLANT
DRSG PAD ABDOMINAL 8X10 ST (GAUZE/BANDAGES/DRESSINGS) IMPLANT
DURAPREP 26ML APPLICATOR (WOUND CARE) ×3 IMPLANT
ELECT NDL TIP 2.8 STRL (NEEDLE) ×1 IMPLANT
ELECT NEEDLE TIP 2.8 STRL (NEEDLE) ×3 IMPLANT
ELECT REM PT RETURN 15FT ADLT (MISCELLANEOUS) ×3 IMPLANT
FILTER STRAW (MISCELLANEOUS) ×1 IMPLANT
GLOVE BIOGEL PI IND STRL 7.5 (GLOVE) ×1 IMPLANT
GLOVE BIOGEL PI IND STRL 8 (GLOVE) ×1 IMPLANT
GLOVE BIOGEL PI INDICATOR 7.5 (GLOVE) ×2
GLOVE BIOGEL PI INDICATOR 8 (GLOVE) ×2
GLOVE SURG SS PI 7.5 STRL IVOR (GLOVE) ×6 IMPLANT
GLOVE SURG SS PI 8.0 STRL IVOR (GLOVE) ×6 IMPLANT
GOWN STRL REUS W/TWL XL LVL3 (GOWN DISPOSABLE) ×6 IMPLANT
KIT BASIN OR (CUSTOM PROCEDURE TRAY) ×6 IMPLANT
KIT TURNOVER KIT A (KITS) IMPLANT
MANIFOLD NEPTUNE II (INSTRUMENTS) ×3 IMPLANT
NDL SCORPION MULTI FIRE (NEEDLE) IMPLANT
NDL SPNL 18GX3.5 QUINCKE PK (NEEDLE) ×1 IMPLANT
NEEDLE SCORPION MULTI FIRE (NEEDLE) ×3 IMPLANT
NEEDLE SPNL 18GX3.5 QUINCKE PK (NEEDLE) ×3 IMPLANT
PACK SHOULDER (CUSTOM PROCEDURE TRAY) ×3 IMPLANT
PENCIL SMOKE EVACUATOR (MISCELLANEOUS) IMPLANT
PORT APPOLLO RF 90DEGREE MULTI (SURGICAL WAND) IMPLANT
PROTECTOR NERVE ULNAR (MISCELLANEOUS) ×3 IMPLANT
RESTRAINT HEAD UNIVERSAL NS (MISCELLANEOUS) ×2 IMPLANT
SLING ARM IMMOBILIZER LRG (SOFTGOODS) ×2 IMPLANT
SLING ARM IMMOBILIZER MED (SOFTGOODS) IMPLANT
SLING ULTRA II L (ORTHOPEDIC SUPPLIES) IMPLANT
STRIP CLOSURE SKIN 1/2X4 (GAUZE/BANDAGES/DRESSINGS) ×1 IMPLANT
SUCTION FRAZIER HANDLE 12FR (TUBING) ×3
SUCTION TUBE FRAZIER 12FR DISP (TUBING) ×1 IMPLANT
SUT ETHIBOND NAB CT1 #1 30IN (SUTURE) IMPLANT
SUT ETHILON 4 0 PS 2 18 (SUTURE) ×3 IMPLANT
SUT FIBERWIRE #2 38 T-5 BLUE (SUTURE)
SUT PROLENE 3 0 PS 2 (SUTURE) ×3 IMPLANT
SUT TIGER TAPE 7 IN WHITE (SUTURE) IMPLANT
SUT VIC AB 0 CT1 27 (SUTURE) ×3
SUT VIC AB 0 CT1 27XBRD ANTBC (SUTURE) ×1 IMPLANT
SUT VIC AB 1-0 CT2 27 (SUTURE) ×2 IMPLANT
SUT VIC AB 2-0 CT1 27 (SUTURE) ×3
SUT VIC AB 2-0 CT1 TAPERPNT 27 (SUTURE) IMPLANT
SUT VIC AB 2-0 CT2 27 (SUTURE) IMPLANT
SUT VICRYL 0 UR6 27IN ABS (SUTURE) ×3 IMPLANT
SUTURE FIBERWR #2 38 T-5 BLUE (SUTURE) IMPLANT
SYR 20ML LL LF (SYRINGE) ×3 IMPLANT
TAPE FIBER 2MM 7IN #2 BLUE (SUTURE) ×4 IMPLANT
TOWEL OR 17X26 10 PK STRL BLUE (TOWEL DISPOSABLE) ×3 IMPLANT
TUBING ARTHROSCOPY IRRIG 16FT (MISCELLANEOUS) ×1 IMPLANT
TUBING CONNECTING 10 (TUBING) ×3 IMPLANT
TUBING CONNECTING 10' (TUBING) ×1
WIPE CHG CHLORHEXIDINE 2% (PERSONAL CARE ITEMS) ×3 IMPLANT

## 2019-12-14 NOTE — Anesthesia Preprocedure Evaluation (Addendum)
Anesthesia Evaluation  Patient identified by MRN, date of birth, ID band Patient awake    Reviewed: Allergy & Precautions, NPO status , Patient's Chart, lab work & pertinent test results, reviewed documented beta blocker date and time   History of Anesthesia Complications Negative for: history of anesthetic complications  Airway Mallampati: II  TM Distance: >3 FB Neck ROM: Full    Dental no notable dental hx.    Pulmonary sleep apnea and Continuous Positive Airway Pressure Ventilation ,    Pulmonary exam normal        Cardiovascular hypertension, Pt. on home beta blockers and Pt. on medications Normal cardiovascular exam     Neuro/Psych  Headaches, negative psych ROS   GI/Hepatic negative GI ROS, Neg liver ROS,   Endo/Other  diabetes, Type 2, Oral Hypoglycemic Agents  Renal/GU negative Renal ROS  negative genitourinary   Musculoskeletal  (+) Arthritis ,   Abdominal   Peds  Hematology negative hematology ROS (+)   Anesthesia Other Findings Day of surgery medications reviewed with patient.  Reproductive/Obstetrics negative OB ROS                            Anesthesia Physical Anesthesia Plan  ASA: II  Anesthesia Plan: General   Post-op Pain Management: GA combined w/ Regional for post-op pain   Induction: Intravenous  PONV Risk Score and Plan: 2 and Treatment may vary due to age or medical condition, Ondansetron and Dexamethasone  Airway Management Planned: Oral ETT  Additional Equipment: None  Intra-op Plan:   Post-operative Plan: Extubation in OR  Informed Consent: I have reviewed the patients History and Physical, chart, labs and discussed the procedure including the risks, benefits and alternatives for the proposed anesthesia with the patient or authorized representative who has indicated his/her understanding and acceptance.     Dental advisory given  Plan Discussed  with: CRNA  Anesthesia Plan Comments:        Anesthesia Quick Evaluation

## 2019-12-14 NOTE — Discharge Instructions (Signed)

## 2019-12-14 NOTE — Anesthesia Procedure Notes (Signed)
Procedure Name: Intubation Date/Time: 12/14/2019 10:57 AM Performed by: Lavina Hamman, CRNA Pre-anesthesia Checklist: Patient identified, Emergency Drugs available, Suction available, Patient being monitored and Timeout performed Patient Re-evaluated:Patient Re-evaluated prior to induction Oxygen Delivery Method: Circle system utilized Preoxygenation: Pre-oxygenation with 100% oxygen Induction Type: IV induction Ventilation: Mask ventilation without difficulty Laryngoscope Size: Mac and 4 Grade View: Grade II Tube type: Oral Tube size: 7.5 mm Number of attempts: 1 Airway Equipment and Method: Stylet Placement Confirmation: ETT inserted through vocal cords under direct vision,  positive ETCO2,  CO2 detector and breath sounds checked- equal and bilateral Secured at: 23 cm Tube secured with: Tape Dental Injury: Teeth and Oropharynx as per pre-operative assessment  Comments: ATOI

## 2019-12-14 NOTE — Progress Notes (Signed)
Assisted Dr. Howze with right, ultrasound guided, interscalene  block. Side rails up, monitors on throughout procedure. See vital signs in flow sheet. Tolerated Procedure well. °

## 2019-12-14 NOTE — Op Note (Signed)
NAME: Christian Ross, PENDRY MEDICAL RECORD DX:[id number] ACCOUNT [id number] DATE OF BIRTH:08/02/1940 FACILITY: [facility] LOCATION: [facility] PHYSICIAN:Durwood Dittus Windy Kalata, MD  OPERATIVE REPORT  DATE OF PROCEDURE:  12/14/2019  PREOPERATIVE DIAGNOSIS:  Massive tear of the rotator cuff, right shoulder.  POSTOPERATIVE DIAGNOSIS:  Massive tear of the rotator cuff, right shoulder.  PROCEDURE PERFORMED:  Mini open rotator cuff repair, right shoulder.  ANESTHESIA:  General and right upper extremity block.  ASSISTANT:  Lacie Draft, PA  HISTORY:  A 79 year old male who had a fall at the beginning of July onto his right shoulder, sustaining a rotator cuff tear by MRI.  Massive retraction.  No fatty atrophy was noted.  No prior problems with his shoulder prior to the fall and with  inability to abduct, felt this demonstrated an acute tear.  He was indicated for open rotator cuff repair.  Risks and benefits discussed including bleeding, infection, damage to neurovascular structures, no change in symptoms, worsening symptoms, DVT,  PE, anesthetic complications, need for patch graft, inability to repair, etc.  TECHNIQUE:  With the patient in supine beach chair position after induction of adequate general anesthesia, 2 grams Kefzol, the right shoulder and upper extremity was prepped and draped in the usual sterile fashion.  A surgical marker was utilized to  delineate the acromion, AC joint and coracoid.  A 3 cm incision was made over the anterolateral aspect of the acromion through the skin only.  Subcutaneous tissue was dissected with Cardiolite to achieve hemostasis.  Raphae between anterolateral heads  were identified, divided in line with skin incision.  A self-retaining retractor was placed.  I digitally lysed subacromial adhesions.  A small spur off the anterolateral aspect of the acromion was removed with a 3 mm Kerrison.  CA ligament was retained.   I copiously irrigated the wound and the joint and  copious portions of clear synovial fluid was evacuated from the joint.  Inspection revealed an absent supraspinatus and infraspinatus.  The subscap was intact.  The biceps tendon was not visualized  proximal to the groove.  After copious irrigation, we made multiple attempts to mobilize the cuff.  Grasping with an Allis of both the supraspinatus and the infraspinatus.  The supraspinatus demonstrated no advance ability beyond one portion, which was  advanced to the mid portion of the humeral head, but it fragmented.  Reattempt at grasping additional portions of the tendon and advancing was unsuccessful.  We turned our attention towards the infraspinatus.  There was a portion of the infraspinatus was  torn and posteriorly retracted.  I mobilized this.  The tendon was fairly thin as well.  A portion of it was still attached though to the posterior lateral aspect of the humeral head.  I used an awl and fashioned a hole just lateral to the articular  surface posteriorly from the midline and I inserted a suture anchor.  An excellent resistance to pullout.  I used TigerTape.  In the portion of the infraspinatus cuff I threaded both through the leading edge of the tendon.  Pulled that.  We then advanced  the tendon and secured it to a 2nd SwiveLock at the greater tuberosity after palpating a hole with an awl.  Had fashioned with a bare rongeur a bed to cancellous tissue.  I then inserted it into the 2nd SwiveLock in a double row fashion, and secured it  without undue tension.  Redundant suture was removed.  I copiously irrigated the wound.  Again, the infraspinatus was attenuated as well.  I made another attempt at identifying and attempting to retrieve any portion of the supraspinatus that could be  advanced and secured but was unsuccessful.  Copiously irrigated the wound then.  Following this, we decided then at that point in time to proceed with closure.  At the initial entry into the joint, we performed a  synovectomy as well.  There was  hypertrophic bursa there.  I repaired the raphae with 0 Vicryl interrupted figure-of-eight sutures, subcu with 2-0 and skin with Prolene.  Sterile dressing applied, placed in a sling, extubated without difficulty and transported to the recovery room in  satisfactory condition.  The patient tolerated the procedure well.  No complications.  Minimal blood loss.  CN/NUANCE  D:12/14/2019 T:12/14/2019 JOB:[id number]

## 2019-12-14 NOTE — Anesthesia Postprocedure Evaluation (Signed)
Anesthesia Post Note  Patient: Christian Ross  Procedure(s) Performed: Right shoulder mini open rotator cuff repair, subacromial decompression, (Right Shoulder)     Patient location during evaluation: PACU Anesthesia Type: General Level of consciousness: awake and sedated Pain management: pain level controlled Vital Signs Assessment: post-procedure vital signs reviewed and stable Respiratory status: spontaneous breathing Cardiovascular status: stable Postop Assessment: no apparent nausea or vomiting Anesthetic complications: no Comments: Patient may be d/c to home following procedure. Block is working, patient uses their CPAP and responsible adult will be staying with them.   No complications documented.  Last Vitals:  Vitals:   12/14/19 1000 12/14/19 1201  BP: 114/78 102/67  Pulse: 60 61  Resp: 16 19  Temp:  36.4 C  SpO2: 96% 100%    Last Pain:  Vitals:   12/14/19 1201  TempSrc:   PainSc: 0-No pain                 Huston Foley

## 2019-12-14 NOTE — Brief Op Note (Signed)
12/14/2019  11:46 AM  PATIENT:  Christian Ross  79 y.o. male  PRE-OPERATIVE DIAGNOSIS:  Right shoulder rotator cuff tear  POST-OPERATIVE DIAGNOSIS:  Right shoulder rotator cuff tear  PROCEDURE:  Procedure(s) with comments: Right shoulder mini open rotator cuff repair, subacromial decompression, possible patch graft (Right) - 2 hrs Choice with Block  SURGEON:  Surgeon(s) and Role:    Susa Day, MD - Primary  PHYSICIAN ASSISTANT:   ASSISTANTS: Bissell   ANESTHESIA:   general  EBL:  50 mL   BLOOD ADMINISTERED:none  DRAINS: none   LOCAL MEDICATIONS USED:  MARCAINE     SPECIMEN:  No Specimen  DISPOSITION OF SPECIMEN:  N/A  COUNTS:  YES  TOURNIQUET:  * No tourniquets in log *  DICTATION: .Other Dictation: Dictation Number  [id number]  PLAN OF CARE: Discharge to home after PACU  PATIENT DISPOSITION:  PACU - hemodynamically stable.   Delay start of Pharmacological VTE agent (>24hrs) due to surgical blood loss or risk of bleeding: n0

## 2019-12-14 NOTE — Interval H&P Note (Signed)
History and Physical Interval Note:  12/14/2019 10:02 AM  [name]  has presented today for surgery, with the diagnosis of Right shoulder rotator cuff tear.  The various methods of treatment have been discussed with the patient and family. After consideration of risks, benefits and other options for treatment, the patient has consented to  Procedure(s) with comments: Right shoulder mini open rotator cuff repair, subacromial decompression, possible patch graft (Right) - 2 hrs Choice with Block as a surgical intervention.  The patient's history has been reviewed, patient examined, no change in status, stable for surgery.  I have reviewed the patient's chart and labs.  Questions were answered to the patient's satisfaction.     Christian Ross

## 2019-12-14 NOTE — Anesthesia Procedure Notes (Signed)
Anesthesia Regional Block: Interscalene brachial plexus block   Pre-Anesthetic Checklist: ,, timeout performed, Correct Patient, Correct Site, Correct Laterality, Correct Procedure, Correct Position, site marked, Risks and benefits discussed, pre-op evaluation,  At surgeon's request and post-op pain management  Laterality: Right  Prep: Maximum Sterile Barrier Precautions used, chloraprep       Needles:  Injection technique: Single-shot  Needle Type: Echogenic Stimulator Needle     Needle Length: 4cm  Needle Gauge: 22     Additional Needles:   Procedures:,,,, ultrasound used (permanent image in chart),,,,  Narrative:  Start time: 12/14/2019 9:37 AM End time: 12/14/2019 9:40 AM Injection made incrementally with aspirations every 5 mL.  Performed by: Personally  Anesthesiologist: Brennan Bailey, MD  Additional Notes: Risks, benefits, and alternative discussed. Patient gave consent for procedure. Patient prepped and draped in sterile fashion. Sedation administered, patient remains easily responsive to voice. Relevant anatomy identified with ultrasound guidance. Local anesthetic given in 5cc increments with no signs or symptoms of intravascular injection. No pain or paraesthesias with injection. Patient monitored throughout procedure with signs of LAST or immediate complications. Tolerated well. Ultrasound image placed in chart.  Tawny Asal, MD

## 2019-12-14 NOTE — Transfer of Care (Signed)
Immediate Anesthesia Transfer of Care Note  Patient: Christian Ross  Procedure(s) Performed: Procedure(s) with comments: Right shoulder mini open rotator cuff repair, subacromial decompression, (Right) - 2 hrs Choice with Block  Patient Location: PACU  Anesthesia Type:GA combined with regional for post-op pain  Level of Consciousness:  sedated, patient cooperative and responds to stimulation  Airway & Oxygen Therapy:Patient Spontanous Breathing and Patient connected to face mask oxgen  Post-op Assessment:  Report given to PACU RN and Post -op Vital signs reviewed and stable  Post vital signs:  Reviewed and stable  Last Vitals:  Vitals:   12/14/19 1000 12/14/19 1201  BP: 114/78 102/67  Pulse: 60 61  Resp: 16 19  Temp:  36.4 C  SpO2: 50% 539%    Complications: No apparent anesthesia complications

## 2019-12-20 ENCOUNTER — Encounter (HOSPITAL_COMMUNITY): Payer: Self-pay | Admitting: Specialist

## 2020-01-09 DIAGNOSIS — M25511 Pain in right shoulder: Secondary | ICD-10-CM | POA: Diagnosis not present

## 2020-01-17 DIAGNOSIS — M25511 Pain in right shoulder: Secondary | ICD-10-CM | POA: Diagnosis not present

## 2020-01-21 ENCOUNTER — Other Ambulatory Visit: Payer: Self-pay | Admitting: Cardiovascular Disease

## 2020-01-24 MED ORDER — ICOSAPENT ETHYL 1 G PO CAPS: 2.0000 g | ORAL_CAPSULE | Freq: Two times a day (BID) | ORAL | 1 refills | Status: DC

## 2020-01-25 DIAGNOSIS — M25511 Pain in right shoulder: Secondary | ICD-10-CM | POA: Diagnosis not present

## 2020-01-30 DIAGNOSIS — M25511 Pain in right shoulder: Secondary | ICD-10-CM | POA: Diagnosis not present

## 2020-02-02 DIAGNOSIS — M25511 Pain in right shoulder: Secondary | ICD-10-CM | POA: Diagnosis not present

## 2020-02-06 DIAGNOSIS — M25511 Pain in right shoulder: Secondary | ICD-10-CM | POA: Diagnosis not present

## 2020-02-09 DIAGNOSIS — M25511 Pain in right shoulder: Secondary | ICD-10-CM | POA: Diagnosis not present

## 2020-02-13 DIAGNOSIS — M25511 Pain in right shoulder: Secondary | ICD-10-CM | POA: Diagnosis not present

## 2020-02-16 DIAGNOSIS — M25511 Pain in right shoulder: Secondary | ICD-10-CM | POA: Diagnosis not present

## 2020-02-20 DIAGNOSIS — M25511 Pain in right shoulder: Secondary | ICD-10-CM | POA: Diagnosis not present

## 2020-02-23 DIAGNOSIS — M25511 Pain in right shoulder: Secondary | ICD-10-CM | POA: Diagnosis not present

## 2020-02-27 DIAGNOSIS — M25511 Pain in right shoulder: Secondary | ICD-10-CM | POA: Diagnosis not present

## 2020-03-01 DIAGNOSIS — M25511 Pain in right shoulder: Secondary | ICD-10-CM | POA: Diagnosis not present

## 2020-03-08 DIAGNOSIS — M25511 Pain in right shoulder: Secondary | ICD-10-CM | POA: Diagnosis not present

## 2020-03-12 DIAGNOSIS — M25511 Pain in right shoulder: Secondary | ICD-10-CM | POA: Diagnosis not present

## 2020-03-14 DIAGNOSIS — L578 Other skin changes due to chronic exposure to nonionizing radiation: Secondary | ICD-10-CM | POA: Diagnosis not present

## 2020-03-14 DIAGNOSIS — D485 Neoplasm of uncertain behavior of skin: Secondary | ICD-10-CM | POA: Diagnosis not present

## 2020-03-14 DIAGNOSIS — Z85828 Personal history of other malignant neoplasm of skin: Secondary | ICD-10-CM | POA: Diagnosis not present

## 2020-03-14 DIAGNOSIS — C4441 Basal cell carcinoma of skin of scalp and neck: Secondary | ICD-10-CM | POA: Diagnosis not present

## 2020-03-14 DIAGNOSIS — L570 Actinic keratosis: Secondary | ICD-10-CM | POA: Diagnosis not present

## 2020-03-14 DIAGNOSIS — Z86018 Personal history of other benign neoplasm: Secondary | ICD-10-CM | POA: Diagnosis not present

## 2020-03-14 DIAGNOSIS — B353 Tinea pedis: Secondary | ICD-10-CM | POA: Diagnosis not present

## 2020-03-14 DIAGNOSIS — L821 Other seborrheic keratosis: Secondary | ICD-10-CM | POA: Diagnosis not present

## 2020-03-14 DIAGNOSIS — D225 Melanocytic nevi of trunk: Secondary | ICD-10-CM | POA: Diagnosis not present

## 2020-03-14 DIAGNOSIS — L814 Other melanin hyperpigmentation: Secondary | ICD-10-CM | POA: Diagnosis not present

## 2020-03-20 DIAGNOSIS — M25511 Pain in right shoulder: Secondary | ICD-10-CM | POA: Diagnosis not present

## 2020-03-26 DIAGNOSIS — M25511 Pain in right shoulder: Secondary | ICD-10-CM | POA: Diagnosis not present

## 2020-03-29 DIAGNOSIS — M25511 Pain in right shoulder: Secondary | ICD-10-CM | POA: Diagnosis not present

## 2020-04-02 DIAGNOSIS — M25511 Pain in right shoulder: Secondary | ICD-10-CM | POA: Diagnosis not present

## 2020-04-06 DIAGNOSIS — M25511 Pain in right shoulder: Secondary | ICD-10-CM | POA: Diagnosis not present

## 2020-04-10 DIAGNOSIS — M25511 Pain in right shoulder: Secondary | ICD-10-CM | POA: Diagnosis not present

## 2020-04-12 DIAGNOSIS — M25511 Pain in right shoulder: Secondary | ICD-10-CM | POA: Diagnosis not present

## 2020-04-16 DIAGNOSIS — M25511 Pain in right shoulder: Secondary | ICD-10-CM | POA: Diagnosis not present

## 2020-04-19 DIAGNOSIS — M25511 Pain in right shoulder: Secondary | ICD-10-CM | POA: Diagnosis not present

## 2020-04-26 DIAGNOSIS — M25511 Pain in right shoulder: Secondary | ICD-10-CM | POA: Diagnosis not present

## 2020-04-30 DIAGNOSIS — M25511 Pain in right shoulder: Secondary | ICD-10-CM | POA: Diagnosis not present

## 2020-05-03 DIAGNOSIS — M25511 Pain in right shoulder: Secondary | ICD-10-CM | POA: Diagnosis not present

## 2020-05-09 DIAGNOSIS — M25511 Pain in right shoulder: Secondary | ICD-10-CM | POA: Diagnosis not present

## 2020-05-10 DIAGNOSIS — C4441 Basal cell carcinoma of skin of scalp and neck: Secondary | ICD-10-CM | POA: Diagnosis not present

## 2020-05-14 DIAGNOSIS — M25511 Pain in right shoulder: Secondary | ICD-10-CM | POA: Diagnosis not present

## 2020-05-17 DIAGNOSIS — M25511 Pain in right shoulder: Secondary | ICD-10-CM | POA: Diagnosis not present

## 2020-05-21 ENCOUNTER — Ambulatory Visit: Payer: Medicare Other | Admitting: Neurology

## 2020-05-21 ENCOUNTER — Other Ambulatory Visit: Payer: Self-pay | Admitting: Cardiovascular Disease

## 2020-05-21 DIAGNOSIS — M25511 Pain in right shoulder: Secondary | ICD-10-CM | POA: Diagnosis not present

## 2020-05-22 DIAGNOSIS — H52223 Regular astigmatism, bilateral: Secondary | ICD-10-CM | POA: Diagnosis not present

## 2020-05-22 DIAGNOSIS — H2513 Age-related nuclear cataract, bilateral: Secondary | ICD-10-CM | POA: Diagnosis not present

## 2020-05-22 DIAGNOSIS — E119 Type 2 diabetes mellitus without complications: Secondary | ICD-10-CM | POA: Diagnosis not present

## 2020-05-22 DIAGNOSIS — H524 Presbyopia: Secondary | ICD-10-CM | POA: Diagnosis not present

## 2020-05-22 DIAGNOSIS — H35033 Hypertensive retinopathy, bilateral: Secondary | ICD-10-CM | POA: Diagnosis not present

## 2020-05-22 DIAGNOSIS — H43812 Vitreous degeneration, left eye: Secondary | ICD-10-CM | POA: Diagnosis not present

## 2020-05-22 DIAGNOSIS — H0100A Unspecified blepharitis right eye, upper and lower eyelids: Secondary | ICD-10-CM | POA: Diagnosis not present

## 2020-05-24 DIAGNOSIS — M25511 Pain in right shoulder: Secondary | ICD-10-CM | POA: Diagnosis not present

## 2020-05-28 DIAGNOSIS — M25511 Pain in right shoulder: Secondary | ICD-10-CM | POA: Diagnosis not present

## 2020-05-31 DIAGNOSIS — M25511 Pain in right shoulder: Secondary | ICD-10-CM | POA: Diagnosis not present

## 2020-06-04 ENCOUNTER — Other Ambulatory Visit: Payer: Self-pay | Admitting: Cardiovascular Disease

## 2020-06-05 DIAGNOSIS — M25511 Pain in right shoulder: Secondary | ICD-10-CM | POA: Diagnosis not present

## 2020-06-07 DIAGNOSIS — M25511 Pain in right shoulder: Secondary | ICD-10-CM | POA: Diagnosis not present

## 2020-06-11 DIAGNOSIS — M25511 Pain in right shoulder: Secondary | ICD-10-CM | POA: Diagnosis not present

## 2020-06-12 ENCOUNTER — Other Ambulatory Visit: Payer: Self-pay

## 2020-06-12 ENCOUNTER — Other Ambulatory Visit: Payer: Medicare Other | Admitting: *Deleted

## 2020-06-12 DIAGNOSIS — E785 Hyperlipidemia, unspecified: Secondary | ICD-10-CM | POA: Diagnosis not present

## 2020-06-12 DIAGNOSIS — R739 Hyperglycemia, unspecified: Secondary | ICD-10-CM

## 2020-06-12 DIAGNOSIS — E781 Pure hyperglyceridemia: Secondary | ICD-10-CM

## 2020-06-12 LAB — BASIC METABOLIC PANEL
BUN/Creatinine Ratio: 17 (ref 10–24)
BUN: 23 mg/dL (ref 8–27)
CO2: 18 mmol/L — ABNORMAL LOW (ref 20–29)
Calcium: 9.4 mg/dL (ref 8.6–10.2)
Chloride: 106 mmol/L (ref 96–106)
Creatinine, Ser: 1.38 mg/dL — ABNORMAL HIGH (ref 0.76–1.27)
GFR calc Af Amer: 57 mL/min/{1.73_m2} — ABNORMAL LOW (ref >59)
GFR calc non Af Amer: 49 mL/min/{1.73_m2} — ABNORMAL LOW (ref >59)
Glucose: 145 mg/dL — ABNORMAL HIGH (ref 65–99)
Potassium: 4.3 mmol/L (ref 3.5–5.2)
Sodium: 140 mmol/L (ref 134–144)

## 2020-06-12 LAB — LIPID PANEL
Chol/HDL Ratio: 6.5 ratio — ABNORMAL HIGH (ref 0.0–5.0)
Cholesterol, Total: 203 mg/dL — ABNORMAL HIGH (ref 100–199)
HDL: 31 mg/dL — ABNORMAL LOW (ref >39)
LDL Chol Calc (NIH): 134 mg/dL — ABNORMAL HIGH (ref 0–99)
Triglycerides: 212 mg/dL — ABNORMAL HIGH (ref 0–149)
VLDL Cholesterol Cal: 38 mg/dL (ref 5–40)

## 2020-06-12 LAB — HEPATIC FUNCTION PANEL
ALT: 27 IU/L (ref 0–44)
AST: 25 IU/L (ref 0–40)
Albumin: 4.4 g/dL (ref 3.7–4.7)
Alkaline Phosphatase: 46 IU/L (ref 44–121)
Bilirubin Total: 0.4 mg/dL (ref 0.0–1.2)
Bilirubin, Direct: 0.14 mg/dL (ref 0.00–0.40)
Total Protein: 7.3 g/dL (ref 6.0–8.5)

## 2020-06-13 ENCOUNTER — Telehealth: Payer: Self-pay

## 2020-06-13 DIAGNOSIS — E781 Pure hyperglyceridemia: Secondary | ICD-10-CM

## 2020-06-13 NOTE — Telephone Encounter (Signed)
Appt scheduled for 3/7 at 9:30

## 2020-06-13 NOTE — Telephone Encounter (Signed)
The patient has been notified of the result and verbalized understanding.  All questions (if any) were answered.  AMB Ref to Lipid Clinic placed per Dr. Elmarie Shiley request. Will send to PharmD to schedule. Patient aware someone will contact him shortly with an appointment.  Wilma Flavin, RN 06/13/2020 8:48 AM

## 2020-06-13 NOTE — Telephone Encounter (Signed)
-----   Message from Thayer Headings, MD sent at 06/13/2020  5:52 AM EST ----- Liver enz are stable Lipid levels - cholesterol and triglycerides are elevated  Has he been seen in the lipid clinic. - I would highly recommend

## 2020-06-15 DIAGNOSIS — M25511 Pain in right shoulder: Secondary | ICD-10-CM | POA: Diagnosis not present

## 2020-06-18 DIAGNOSIS — M25511 Pain in right shoulder: Secondary | ICD-10-CM | POA: Diagnosis not present

## 2020-06-19 DIAGNOSIS — E785 Hyperlipidemia, unspecified: Secondary | ICD-10-CM | POA: Diagnosis not present

## 2020-06-19 DIAGNOSIS — G4733 Obstructive sleep apnea (adult) (pediatric): Secondary | ICD-10-CM | POA: Diagnosis not present

## 2020-06-19 DIAGNOSIS — G44059 Short lasting unilateral neuralgiform headache with conjunctival injection and tearing (SUNCT), not intractable: Secondary | ICD-10-CM | POA: Diagnosis not present

## 2020-06-19 DIAGNOSIS — I700 Atherosclerosis of aorta: Secondary | ICD-10-CM | POA: Diagnosis not present

## 2020-06-19 DIAGNOSIS — E1169 Type 2 diabetes mellitus with other specified complication: Secondary | ICD-10-CM | POA: Diagnosis not present

## 2020-06-19 DIAGNOSIS — N183 Chronic kidney disease, stage 3 unspecified: Secondary | ICD-10-CM | POA: Diagnosis not present

## 2020-06-19 DIAGNOSIS — I10 Essential (primary) hypertension: Secondary | ICD-10-CM | POA: Diagnosis not present

## 2020-06-19 DIAGNOSIS — Z0000 Encounter for general adult medical examination without abnormal findings: Secondary | ICD-10-CM | POA: Diagnosis not present

## 2020-06-25 ENCOUNTER — Ambulatory Visit (INDEPENDENT_AMBULATORY_CARE_PROVIDER_SITE_OTHER): Payer: Medicare Other | Admitting: Pharmacist

## 2020-06-25 ENCOUNTER — Other Ambulatory Visit: Payer: Self-pay

## 2020-06-25 ENCOUNTER — Encounter: Payer: Self-pay | Admitting: Pharmacist

## 2020-06-25 DIAGNOSIS — R7989 Other specified abnormal findings of blood chemistry: Secondary | ICD-10-CM | POA: Insufficient documentation

## 2020-06-25 DIAGNOSIS — N183 Chronic kidney disease, stage 3 unspecified: Secondary | ICD-10-CM | POA: Insufficient documentation

## 2020-06-25 DIAGNOSIS — E785 Hyperlipidemia, unspecified: Secondary | ICD-10-CM

## 2020-06-25 DIAGNOSIS — E1169 Type 2 diabetes mellitus with other specified complication: Secondary | ICD-10-CM | POA: Insufficient documentation

## 2020-06-25 DIAGNOSIS — H35039 Hypertensive retinopathy, unspecified eye: Secondary | ICD-10-CM | POA: Insufficient documentation

## 2020-06-25 DIAGNOSIS — J309 Allergic rhinitis, unspecified: Secondary | ICD-10-CM | POA: Insufficient documentation

## 2020-06-25 DIAGNOSIS — I700 Atherosclerosis of aorta: Secondary | ICD-10-CM | POA: Insufficient documentation

## 2020-06-25 NOTE — Progress Notes (Signed)
Patient ID: Christian Ross                 DOB: 12/04/1940                    MRN: [id number]     HPI: Christian Ross is a 80 y.o. male patient referred to lipid clinic by Dr Acie Fredrickson. PMH is significant for HTN, HLD, and DM (A1c 7.1).  Last seen by Dr. Acie Fredrickson on 06/15/19 and has upcoming yearly appointment next month.  Routine lab work showed increased LDL and triglycerides and patient was referred to lipid clinic.  Patient presents today in good spirits.  Reports he has always struggled controlling his triglycerides even while on Vascepa and fenofibrate.  Has previously been on atorvastatin and rosuvastatin however patient is wary of statin side effects due to his recent history of joint pain.  Managed by [facility].  Patient reports he has been working on improving his diet.  Has been checking his blood sugar 4-5 times a week and fasting levels have been between 100-120.  Has eliminated sodas from his diet but will still have sweet tea, juices, and milk. Wife does purchase processed foods such as frozen pizzas and frozen hamburger. Does not drink alcohol.     Current Medications: Vascepa 2g BID, fenofibrate 145mg  daily Risk Factors: HTN, DM, CAD LDL goal: <70  Labs: LDL 134, HDL 31, Trigs 212, TC 203 ((06/12/20) on vascepa 2g BID, fenofibrate 145mg )  Past Medical History:  Diagnosis Date  . Allergy   . Arthritis   . Cancer ([place])    skin pre cancer nose  . Cough May 2016   new x48mo, denies fever and shortness of breath  . Diabetes mellitus without complication ([place])    type 2   . Diverticulitis   . Erectile dysfunction   . Hemorrhoids   . History of skin cancer    basal cell removed left arm 09/07/14  . Hx of colonic polyps   . Hyperlipidemia   . Hypertension    orthostatic  . Hypertriglyceridemia   . Rotator cuff tear    left  . Sleep apnea    cpap setting at 4   . 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 Vitamin D deficiency     Current Outpatient  Medications on File Prior to Visit  Medication Sig Dispense Refill  . Ascorbic Acid (VITAMIN C PO) Take 3 g by mouth every morning.  (Patient not taking: Reported on 12/07/2019)    . Ascorbic Acid (VITAMIN C) 1000 MG tablet Take 3,000 mg by mouth daily.    Marland Kitchen aspirin 81 MG tablet Take 81 mg by mouth every evening.     Jolyne Loa Grape-Goldenseal (BERBERINE COMPLEX PO) Take 2 capsules by mouth daily.    . Berberine Chloride POWD Take 800 mg by mouth every morning.  (Patient not taking: Reported on 12/07/2019)    . cephALEXin (KEFLEX) 500 MG capsule Take 1 capsule (500 mg total) by mouth 4 (four) times daily. 6 capsule 1  . docusate sodium (COLACE) 100 MG capsule Take 1 capsule (100 mg total) by mouth 2 (two) times daily. 60 capsule 2  . fenofibrate (TRICOR) 145 MG tablet TAKE 1 TABLET DAILY 90 tablet 0  . glipiZIDE (GLUCOTROL XL) 10 MG 24 hr tablet Take 10 mg by mouth daily with breakfast.     . icosapent Ethyl (VASCEPA) 1 g capsule Take 2 capsules (2 g total)  by mouth 2 (two) times daily. 360 capsule 1  . lamoTRIgine (LAMICTAL) 25 MG tablet Take 2 tablets (50 mg total) by mouth 2 (two) times daily. 360 tablet 3  . Lecithin 1200 MG CAPS Take 2,400 mg by mouth every morning.     . metoprolol tartrate (LOPRESSOR) 25 MG tablet Take 1 tablet (25 mg total) by mouth 2 (two) times daily. Please make overdue appt with Dr. Acie Fredrickson before anymore refills. Thank you 1st attempt 60 tablet 0  . mometasone (NASONEX) 50 MCG/ACT nasal spray Place 2 sprays into the nose daily as needed (allergies). (Patient not taking: Reported on 12/07/2019)    . OVER THE COUNTER MEDICATION Take 2 tablets by mouth every morning. [place]    . oxyCODONE-acetaminophen (PERCOCET) 5-325 MG tablet Take 1-2 tablets by mouth every 4 (four) hours as needed for severe pain. 40 tablet 0  . polyethylene glycol (MIRALAX / GLYCOLAX) 17 g packet Take 17 g by mouth daily. 14 each 0  . Saw Palmetto, Serenoa repens, (SAW PALMETTO PO) Take 2  capsules by mouth in the morning and at bedtime.     Marland Kitchen tobramycin-dexamethasone (TOBRADEX) ophthalmic solution SHAKE LQ AND INT 1 GTT IN OU TID (Patient not taking: Reported on 12/07/2019)    . topiramate (TOPAMAX) 50 MG tablet Take 2 tablets (100 mg total) by mouth 2 (two) times daily. 360 tablet 3  . TURMERIC PO Take 3 capsules by mouth in the morning and at bedtime.     Marland Kitchen Ubiquinol 200 MG CAPS Take 400 mg by mouth in the morning and at bedtime.     . [DISCONTINUED] irbesartan (AVAPRO) 150 MG tablet Take 1 tablet (150 mg total) by mouth daily. 90 tablet 3   No current facility-administered medications on file prior to visit.    Allergies  Allergen Reactions  . Lisinopril Cough    Assessment/Plan:  1. Hyperlipidemia - Patient LDL 134 which is above goal of <70 and triglycerides are 212 which is above goal of <150.    Discussed diet changes with patient.  Recommended reducing/eliminating processed foods and sugary beverages such as juices.  Recommended increasing vegetable consumption.    Patient needs ~50% LDL lowering and since is skeptical of statins, next best option would be PCSK9i.  Using demo pen, educated patient on storage, site selection and administration.  Explained mechanism of action and showed education videos which helped him understand.  Will complete PA and contact patient.  Repeat lipid panel scheduled for late May.  Karren Cobble, PharmD, BCACP, [place], [place] [address] [place], [place] [address] Phone: [phone]; Fax: [phone] 06/25/2020 3:25 PM

## 2020-06-25 NOTE — Patient Instructions (Addendum)
It was great meeting you today!  We would like your LDL (bad cholesterol) to be less than 70 and your triglycerides less than 150  Continue fenofibrate 145mg  once a day and your Vascepa 2 grams twice a day  We will start you on a new medication you will inject once every 2 weeks and I will call you when it is approved  We will then recheck your cholesterol in 2-3 months  Continue healthy eating and try to cut down on processed and sugary foods  Please call with any questions!  Karren Cobble, PharmD, BCACP, [place], [place] [address] [place], [place] [address] Phone: [phone]; Fax: [phone] 06/25/2020 10:02 AM

## 2020-06-26 ENCOUNTER — Telehealth: Payer: Self-pay | Admitting: Pharmacist

## 2020-06-26 MED ORDER — EVOLOCUMAB 140 MG/ML ~~LOC~~ SOAJ: 1.0000 mL | SUBCUTANEOUS | 3 refills | Status: DC

## 2020-06-26 NOTE — Telephone Encounter (Signed)
PA for Repatha approved through 06/25/21.  Called patient and he is aware.  Rx sent to pharmacy

## 2020-07-20 ENCOUNTER — Other Ambulatory Visit: Payer: Self-pay | Admitting: Cardiovascular Disease

## 2020-07-28 ENCOUNTER — Encounter: Payer: Self-pay | Admitting: Cardiovascular Disease

## 2020-07-28 NOTE — Progress Notes (Signed)
[name] Date of Birth  10/05/1940             1. Hyperlipidemia 2.Hypertension  Notes prior to 2014:   Christian Ross seems to be doing well.  He continues to have elevations in his triglyceride levels.  He is tolerating the crestor now.    Sept. 15, 2014:   Still active flying, not exercising as much as he should.  No CP or dyspnea.  Sept. 24, 2015:  Christian Ross is doing well.  Has lost 10 lbs since  I last saw him.  Exercising more.  HbA1C  Has improved.   He is no longer flying because he is on Lamactil.    Sept. 26, 2016:     Doing well from a cardiac standpoint On Flomax for BPH.  No cardiac issues.   Sept. 19, 2017  Doing well from a cardiac standpoint. Had some foot issues.   Was in a boot for several months  Now has orthotic arch supports in his shoes.  Glucose has been slightly elevated.    October 20, 2016:  Christian Ross is seen today for follow up .  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 25, 2019:  No longer is flying . He is off the meds that prevented him from passing his medical  No CP or dyspnea Knows that he needs to exercise more His medical doctor is following his DM and lipids   Feb. 25, 2020: He was seen today for follow-up of his chest pain and hyperlipidemia. Has a question about Vascepa  He had recent labs drawn drawn at his primary medical office.  His last cholesterol was 213.  His HDL is 31.  The LDL is 125.  The triglyceride level is 281.  Open A1c is 7.7.  Hemoglobin is 13.2.  TSH is 3.79. Still eating lots of carbs and fats Is not walking    Feb. 24, 2021 Christian Ross is seen for follow up of his CP, HTN, HLD We started Vascepa at his last visit  Recent Trigs are still markedly elevated - 344 Chol = 188,  LDL is 101 BMP is stable  Eating more sweets than he should.  Is not exercising much   July 30, 2020: Christian Ross is seen today for follow up of his CP, HTN, HLD  No CP or dyspnea  Was just  started on Repatha   Current Outpatient Medications on File Prior to Visit  Medication Sig Dispense Refill  . Ascorbic Acid (VITAMIN C PO) Take 3 g by mouth every morning.  (Patient not taking: Reported on 12/07/2019)    . Ascorbic Acid (VITAMIN C) 1000 MG tablet Take 3,000 mg by mouth daily.    Marland Kitchen aspirin 81 MG tablet Take 81 mg by mouth every evening.     Jolyne Loa Grape-Goldenseal (BERBERINE COMPLEX PO) Take 2 capsules by mouth daily.    . Berberine Chloride POWD Take 800 mg by mouth every morning.  (Patient not taking: Reported on 12/07/2019)    . cephALEXin (KEFLEX) 500 MG capsule Take 1 capsule (500 mg total) by mouth 4 (four) times daily. 6 capsule 1  . docusate sodium (COLACE) 100 MG capsule Take 1 capsule (100 mg total) by mouth 2 (two) times daily. 60 capsule 2  . Evolocumab 140 MG/ML SOAJ Inject 1 mL into the skin every 14 (fourteen) days. 6 mL 3  . fenofibrate (TRICOR) 145 MG tablet TAKE  1 TABLET DAILY 90 tablet 0  . glipiZIDE (GLUCOTROL XL) 10 MG 24 hr tablet Take 10 mg by mouth daily with breakfast.     . lamoTRIgine (LAMICTAL) 25 MG tablet Take 2 tablets (50 mg total) by mouth 2 (two) times daily. 360 tablet 3  . Lecithin 1200 MG CAPS Take 2,400 mg by mouth every morning.     . metoprolol tartrate (LOPRESSOR) 25 MG tablet Take 1 tablet (25 mg total) by mouth 2 (two) times daily. Please make overdue appt with Dr. Acie Fredrickson before anymore refills. Thank you 1st attempt 60 tablet 0  . mometasone (NASONEX) 50 MCG/ACT nasal spray Place 2 sprays into the nose daily as needed (allergies). (Patient not taking: Reported on 12/07/2019)    . OVER THE COUNTER MEDICATION Take 2 tablets by mouth every morning. [place]    . oxyCODONE-acetaminophen (PERCOCET) 5-325 MG tablet Take 1-2 tablets by mouth every 4 (four) hours as needed for severe pain. 40 tablet 0  . polyethylene glycol (MIRALAX / GLYCOLAX) 17 g packet Take 17 g by mouth daily. 14 each 0  . Saw Palmetto, Serenoa repens, (SAW  PALMETTO PO) Take 2 capsules by mouth in the morning and at bedtime.     Marland Kitchen tobramycin-dexamethasone (TOBRADEX) ophthalmic solution SHAKE LQ AND INT 1 GTT IN OU TID (Patient not taking: Reported on 12/07/2019)    . topiramate (TOPAMAX) 50 MG tablet Take 2 tablets (100 mg total) by mouth 2 (two) times daily. 360 tablet 3  . TURMERIC PO Take 3 capsules by mouth in the morning and at bedtime.     Marland Kitchen Ubiquinol 200 MG CAPS Take 400 mg by mouth in the morning and at bedtime.     Marland Kitchen VASCEPA 1 g capsule TAKE 2 CAPSULES TWICE A DAY 360 capsule 3  . [DISCONTINUED] irbesartan (AVAPRO) 150 MG tablet Take 1 tablet (150 mg total) by mouth daily. 90 tablet 3   No current facility-administered medications on file prior to visit.    Allergies  Allergen Reactions  . Lisinopril Cough    Past Medical History:  Diagnosis Date  . Allergy   . Arthritis   . Cancer ([place])    skin pre cancer nose  . Cough May 2016   new x14mo, denies fever and shortness of breath  . Diabetes mellitus without complication ([place])    type 2   . Diverticulitis   . Erectile dysfunction   . Hemorrhoids   . History of skin cancer    basal cell removed left arm 09/07/14  . Hx of colonic polyps   . Hyperlipidemia   . Hypertension    orthostatic  . Hypertriglyceridemia   . Rotator cuff tear    left  . Sleep apnea    cpap setting at 4   . 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 Vitamin D deficiency     Past Surgical History:  Procedure Laterality Date  . COLONOSCOPY W/ BIOPSIES AND POLYPECTOMY  multiple, 2005, 08, 14  . [place]   right / for [place]  . NASAL SINUS SURGERY    . RECTAL BIOPSY    . SHOULDER ARTHROSCOPY WITH ROTATOR CUFF REPAIR AND SUBACROMIAL DECOMPRESSION Right 12/14/2019   Procedure: Right shoulder mini open rotator cuff repair, subacromial decompression,;  Surgeon: Susa Day, MD;  Location: [facility];  Service: Orthopedics;  Laterality: Right;  2 hrs Choice with Block  .  SHOULDER OPEN ROTATOR CUFF REPAIR Left 09/14/2014  Procedure: LEFT SHOULDER MINI OPEN ROTATOR CUFF REPAIR AND SUBACROMIAL DECOMPRESSION ;  Surgeon: Susa Day, MD;  Location: [facility];  Service: Orthopedics;  Laterality: Left;    Social History   Tobacco Use  Smoking Status Never Smoker  Smokeless Tobacco Never Used    Social History   Substance and Sexual Activity  Alcohol Use Yes   Comment: rarely    Family History  Problem Relation Age of Onset  . Colon cancer Neg Hx   . Colon polyps Neg Hx   . Esophageal cancer Neg Hx   . Rectal cancer Neg Hx   . Stomach cancer Neg Hx     Reviw of Systems:  Reviewed in the HPI.  All other systems are negative.  Physical Exam: There were no vitals taken for this visit.  GEN:  Well nourished, well developed in no acute distress HEENT: Normal NECK: No JVD; No carotid bruits LYMPHATICS: No lymphadenopathy CARDIAC: RRR , no murmurs, rubs, gallops RESPIRATORY:  Clear to auscultation without rales, wheezing or rhonchi  ABDOMEN: Soft, non-tender, non-distended MUSCULOSKELETAL:  No edema; No deformity  SKIN: Warm and dry NEUROLOGIC:  Alert and oriented x 3    ECG: .  July 30, 2020: Normal sinus rhythm at 65.  No ST or T wave changes.  Assessment / Plan:   1. Hyperlipidemia-      he is now been seen in the lipid clinic.  He is now on Repatha.  He is also on TriCor and Vascepa. Further recommendations per her pharmacist in the lipid clinic.    2.Hypertension-      blood pressure looks good.  Continue current medications.  I will see him again in 1 year.   Mertie Moores, MD  07/28/2020 6:58 PM    [facility] [place],  [place], [place]  [address] Pager [phone] Phone: [phone]; Fax: [phone]

## 2020-07-30 ENCOUNTER — Encounter: Payer: Self-pay | Admitting: Cardiovascular Disease

## 2020-07-30 ENCOUNTER — Ambulatory Visit (INDEPENDENT_AMBULATORY_CARE_PROVIDER_SITE_OTHER): Payer: Medicare Other | Admitting: Cardiovascular Disease

## 2020-07-30 ENCOUNTER — Other Ambulatory Visit: Payer: Self-pay

## 2020-07-30 VITALS — BP 124/76 | HR 65 | Ht >= 80 in | Wt 212.4 lb

## 2020-07-30 DIAGNOSIS — I10 Essential (primary) hypertension: Secondary | ICD-10-CM | POA: Diagnosis not present

## 2020-07-30 DIAGNOSIS — E782 Mixed hyperlipidemia: Secondary | ICD-10-CM | POA: Diagnosis not present

## 2020-07-30 MED ORDER — FENOFIBRATE 145 MG PO TABS
145.0000 mg | ORAL_TABLET | Freq: Every day | ORAL | 3 refills | Status: DC
Start: 2020-07-30 — End: 2021-08-26

## 2020-07-30 MED ORDER — METOPROLOL TARTRATE 25 MG PO TABS
25.0000 mg | ORAL_TABLET | Freq: Two times a day (BID) | ORAL | 3 refills | Status: DC
Start: 2020-07-30 — End: 2020-07-30

## 2020-07-30 MED ORDER — METOPROLOL TARTRATE 25 MG PO TABS: 25.0000 mg | ORAL_TABLET | Freq: Two times a day (BID) | ORAL | 3 refills | Status: DC

## 2020-07-30 MED ORDER — FENOFIBRATE 145 MG PO TABS
145.0000 mg | ORAL_TABLET | Freq: Every day | ORAL | 3 refills | Status: DC
Start: 2020-07-30 — End: 2020-07-30

## 2020-07-30 NOTE — Patient Instructions (Signed)

## 2020-08-01 NOTE — Progress Notes (Signed)
PATIENT: Christian Ross DOB: 01/15/1941  REASON FOR VISIT: follow up HISTORY FROM: patient  HISTORY OF PRESENT ILLNESS: Today 08/02/20  [name] is a 80 year old male with history of diabetes, and SUNCT headache.  Last seen in July 2021, having increased pain to the left eyebrow, Topamax was increased to 100 mg twice daily, continued Lamictal 50 mg twice daily. Reviewed labs from 06/12/20, Hepatic Function Panel was normal, BMP showed glucose 145, creatinine 1.38 (stable since 2018).  Is currently taking Topamax 50 mg AM/25 mg PM.  He does not remember ever increasing Topamax.  Remains on Lamictal 50 mg twice daily.  Since last seen, denies any pain from the SUNCT headache, not even a twitch.  Overall doing excellent, no changes to overall health.  Here today for evaluation unaccompanied.  Tolerates medications well, denies side effect.  Update 11/16/2019 SS: [name] is a 80 year old male with history of diabetes and history of SUNCT headache.  He remains on Topamax 50 mg twice daily, Lamictal 50 mg twice a day.  Pain has been well controlled up until about 2 months ago, has pain above his left eyebrow with touch, such as washing his face, is otherwise okay.  Pain is level 6/10.  Had a fall recently, injured his right shoulder.  Presents today for evaluation unaccompanied.  HISTORY 11/10/2018 Dr. Jannifer Franklin: [name] is a 80 year old right-handed white male with a history of diabetes and history of SUNCT headache.  The patient was to come off of the Lamictal on the last visit but he never tapered the medication.  He remains on 50 mg twice daily.  The patient is also on 50 mg twice daily of the Topamax.  He has not had any twinges of pain whatsoever.  He recently had some adjustments to his diabetic medications secondary to elevated blood sugars.  Otherwise, he has done well medically.  REVIEW OF SYSTEMS: Out of a complete 14 system review of symptoms, the patient complains only of the  following symptoms, and all other reviewed systems are negative.  Facial pain  ALLERGIES: Allergies  Allergen Reactions  . Lisinopril Cough    HOME MEDICATIONS: Outpatient Medications Prior to Visit  Medication Sig Dispense Refill  . Ascorbic Acid (VITAMIN C PO) Take 3 g by mouth every morning.    . Ascorbic Acid (VITAMIN C) 1000 MG tablet Take 3,000 mg by mouth daily.    Marland Kitchen aspirin 81 MG tablet Take 81 mg by mouth every evening.    . Berberine Chloride POWD Take 800 mg by mouth every morning.    . Evolocumab 140 MG/ML SOAJ Inject 1 mL into the skin every 14 (fourteen) days. 6 mL 3  . fenofibrate (TRICOR) 145 MG tablet Take 1 tablet (145 mg total) by mouth daily. 90 tablet 3  . glipiZIDE (GLUCOTROL XL) 10 MG 24 hr tablet Take 10 mg by mouth daily with breakfast.     . Glucosamine-Chondroit-Vit C-Mn (GLUCOSAMINE 1500 COMPLEX) CAPS Take 2 tablets by mouth daily.    . Lecithin 1200 MG CAPS Take 2,400 mg by mouth every morning.     . metoprolol tartrate (LOPRESSOR) 25 MG tablet Take 1 tablet (25 mg total) by mouth 2 (two) times daily. 180 tablet 3  . mometasone (NASONEX) 50 MCG/ACT nasal spray Place 2 sprays into the nose daily as needed (allergies).    Marland Kitchen OVER THE COUNTER MEDICATION Take 2 tablets by mouth every morning. [place]    . Saw Palmetto, Serenoa repens, (  SAW PALMETTO PO) Take 2 capsules by mouth in the morning and at bedtime.     . TURMERIC PO Take 3 capsules by mouth in the morning and at bedtime.     Marland Kitchen Ubiquinol 200 MG CAPS Take 400 mg by mouth in the morning and at bedtime.     Marland Kitchen VASCEPA 1 g capsule TAKE 2 CAPSULES TWICE A DAY 360 capsule 3  . lamoTRIgine (LAMICTAL) 25 MG tablet Take 2 tablets (50 mg total) by mouth 2 (two) times daily. 360 tablet 3  . topiramate (TOPAMAX) 25 MG tablet Take 2 tablets by mouth 2 (two) times daily. 2 tabs in am , 1 tabs in pm    . polyethylene glycol (MIRALAX / GLYCOLAX) 17 g packet Take 17 g by mouth daily. 14 each 0   No  facility-administered medications prior to visit.    PAST MEDICAL HISTORY: Past Medical History:  Diagnosis Date  . Allergy   . Arthritis   . Cancer ([place])    skin pre cancer nose  . Cough May 2016   new x11mo, denies fever and shortness of breath  . Diabetes mellitus without complication ([place])    type 2   . Diverticulitis   . Erectile dysfunction   . Hemorrhoids   . History of skin cancer    basal cell removed left arm 09/07/14  . Hx of colonic polyps   . Hyperlipidemia   . Hypertension    orthostatic  . Hypertriglyceridemia   . Rotator cuff tear    left  . Sleep apnea    cpap setting at 4   . 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 Vitamin D deficiency     PAST SURGICAL HISTORY: Past Surgical History:  Procedure Laterality Date  . COLONOSCOPY W/ BIOPSIES AND POLYPECTOMY  multiple, 2005, 08, 14  . [place]   right / for [place]  . NASAL SINUS SURGERY    . RECTAL BIOPSY    . SHOULDER ARTHROSCOPY WITH ROTATOR CUFF REPAIR AND SUBACROMIAL DECOMPRESSION Right 12/14/2019   Procedure: Right shoulder mini open rotator cuff repair, subacromial decompression,;  Surgeon: Susa Day, MD;  Location: [facility];  Service: Orthopedics;  Laterality: Right;  2 hrs Choice with Block  . SHOULDER OPEN ROTATOR CUFF REPAIR Left 09/14/2014   Procedure: LEFT SHOULDER MINI OPEN ROTATOR CUFF REPAIR AND SUBACROMIAL DECOMPRESSION ;  Surgeon: Susa Day, MD;  Location: [facility];  Service: Orthopedics;  Laterality: Left;    FAMILY HISTORY: Family History  Problem Relation Age of Onset  . Colon cancer Neg Hx   . Colon polyps Neg Hx   . Esophageal cancer Neg Hx   . Rectal cancer Neg Hx   . Stomach cancer Neg Hx     SOCIAL HISTORY: Social History   Socioeconomic History  . Marital status: Married    Spouse name: Not on file  . Number of children: 2  . Years of education: college  . Highest education level: Not on file  Occupational History  . Occupation:  Futures trader: RETIRED  Tobacco Use  . Smoking status: Never Smoker  . Smokeless tobacco: Never Used  Vaping Use  . Vaping Use: Never used  Substance and Sexual Activity  . Alcohol use: Yes    Comment: rarely  . Drug use: No  . Sexual activity: Never  Other Topics Concern  . Not on file  Social History Narrative   Lives at  home w/ wife   Patient drinks a couple of sodas a week.   Patient is right handed.    Social Determinants of Health   Financial Resource Strain: Not on file  Food Insecurity: Not on file  Transportation Needs: Not on file  Physical Activity: Not on file  Stress: Not on file  Social Connections: Not on file  Intimate Partner Violence: Not on file   PHYSICAL EXAM  Vitals:   08/02/20 0810  BP: 130/81  Pulse: 73  Weight: 213 lb (96.6 kg)  Height: 5\' 11"  (1.803 m)   Body mass index is 29.71 kg/m.  Generalized: Well developed, in no acute distress  Neurological examination  Mentation: Alert oriented to time, place, history taking. Follows all commands speech and language fluent Cranial nerve II-XII: Pupils were equal round reactive to light. Extraocular movements were full, visual field were full on confrontational test. Facial sensation and strength were normal. Right shoulder shrug limited. Motor: Good strength overall, no weakness noted  Sensory: Sensory testing is intact to soft touch on all 4 extremities. No evidence of extinction is noted. Left V1 not sensitive to touch.  Coordination: Cerebellar testing reveals good finger-nose-finger and heel-to-shin bilaterally.  Gait and station: Gait is normal, and steady Reflexes: Normal    DIAGNOSTIC DATA (LABS, IMAGING, TESTING) - I reviewed patient records, labs, notes, testing and imaging myself where available.  Lab Results  Component Value Date   WBC 6.4 12/09/2019   HGB 14.5 12/09/2019   HCT 44.2 12/09/2019   MCV 99.3 12/09/2019   PLT 267 12/09/2019      Component Value Date/Time   NA  140 06/12/2020 0806   K 4.3 06/12/2020 0806   CL 106 06/12/2020 0806   CO2 18 (L) 06/12/2020 0806   GLUCOSE 145 (H) 06/12/2020 0806   GLUCOSE 146 (H) 12/09/2019 0944   BUN 23 06/12/2020 0806   CREATININE 1.38 (H) 06/12/2020 0806   CALCIUM 9.4 06/12/2020 0806   PROT 7.3 06/12/2020 0806   ALBUMIN 4.4 06/12/2020 0806   AST 25 06/12/2020 0806   ALT 27 06/12/2020 0806   ALKPHOS 46 06/12/2020 0806   BILITOT 0.4 06/12/2020 0806   GFRNONAA 49 (L) 06/12/2020 0806   GFRAA 57 (L) 06/12/2020 0806   Lab Results  Component Value Date   CHOL 203 (H) 06/12/2020   HDL 31 (L) 06/12/2020   LDLCALC 134 (H) 06/12/2020   LDLDIRECT 98.0 01/15/2015   TRIG 212 (H) 06/12/2020   CHOLHDL 6.5 (H) 06/12/2020   Lab Results  Component Value Date   HGBA1C 7.6 (H) 07/23/2016   No results found for: VITAMINB12 No results found for: TSH    ASSESSMENT AND PLAN 80 y.o. year old male  has a past medical history of Allergy, Arthritis, Cancer ([place]), Cough (May 2016), Diabetes mellitus without complication ([place]), Diverticulitis, Erectile dysfunction, Hemorrhoids, History of skin cancer, colonic polyps, Hyperlipidemia, Hypertension, Hypertriglyceridemia, Rotator cuff tear, Sleep apnea, SUNCT (05/14/12), and Vitamin D deficiency. here with:  1. SUNCT headaches  -Pain remains under excellent control -Decrease Topamax 25 mg twice daily -Continue Lamictal 50 mg twice daily -Call for increasing pain -Follow-up in 1 year or sooner if needed  I spent 20 minutes of face-to-face and non-face-to-face time with patient.  This included previsit chart review, lab review, study review, order entry, electronic health record documentation, patient education.  Butler Denmark, AGNP-C, DNP 08/02/2020, 8:37 AM [facility] [address], [place], [place] [address] [phone]

## 2020-08-02 ENCOUNTER — Encounter: Payer: Self-pay | Admitting: Neurology

## 2020-08-02 ENCOUNTER — Ambulatory Visit (INDEPENDENT_AMBULATORY_CARE_PROVIDER_SITE_OTHER): Payer: Medicare Other | Admitting: Neurology

## 2020-08-02 ENCOUNTER — Other Ambulatory Visit: Payer: Self-pay

## 2020-08-02 VITALS — BP 130/81 | HR 73 | Ht 71.0 in | Wt 213.0 lb

## 2020-08-02 DIAGNOSIS — G44059 Short lasting unilateral neuralgiform headache with conjunctival injection and tearing (SUNCT), not intractable: Secondary | ICD-10-CM | POA: Diagnosis not present

## 2020-08-02 MED ORDER — TOPIRAMATE 25 MG PO TABS: 25.0000 mg | ORAL_TABLET | Freq: Two times a day (BID) | ORAL | 3 refills | Status: DC

## 2020-08-02 MED ORDER — LAMOTRIGINE 25 MG PO TABS: 50.0000 mg | ORAL_TABLET | Freq: Two times a day (BID) | ORAL | 3 refills | Status: DC

## 2020-08-02 NOTE — Progress Notes (Signed)
I have read the note, and I agree with the clinical assessment and plan.  Vuk Skillern K Nealy Karapetian   

## 2020-08-02 NOTE — Patient Instructions (Signed)
Decrease Topamax 25 mg twice daily Keep Lamictal at current dosing Call for any return of pain  If you want to further reduce, call me  See you back in 1 year

## 2020-09-04 DIAGNOSIS — H0100A Unspecified blepharitis right eye, upper and lower eyelids: Secondary | ICD-10-CM | POA: Diagnosis not present

## 2020-09-04 DIAGNOSIS — H1132 Conjunctival hemorrhage, left eye: Secondary | ICD-10-CM | POA: Diagnosis not present

## 2020-09-10 ENCOUNTER — Other Ambulatory Visit: Payer: Medicare Other

## 2020-09-18 ENCOUNTER — Other Ambulatory Visit: Payer: Medicare Other | Admitting: *Deleted

## 2020-09-18 ENCOUNTER — Other Ambulatory Visit: Payer: Self-pay

## 2020-09-18 DIAGNOSIS — E785 Hyperlipidemia, unspecified: Secondary | ICD-10-CM

## 2020-09-18 LAB — LIPID PANEL
Chol/HDL Ratio: 2.6 ratio (ref 0.0–5.0)
Cholesterol, Total: 91 mg/dL — ABNORMAL LOW (ref 100–199)
HDL: 35 mg/dL — ABNORMAL LOW (ref >39)
LDL Chol Calc (NIH): 27 mg/dL (ref 0–99)
Triglycerides: 178 mg/dL — ABNORMAL HIGH (ref 0–149)
VLDL Cholesterol Cal: 29 mg/dL (ref 5–40)

## 2020-10-19 DIAGNOSIS — H6122 Impacted cerumen, left ear: Secondary | ICD-10-CM | POA: Diagnosis not present

## 2020-11-15 ENCOUNTER — Telehealth: Payer: Self-pay | Admitting: Neurology

## 2020-11-15 NOTE — Telephone Encounter (Signed)
Pt is asking for a call to discuss him being down to 3 days worth of his topiramate (TOPAMAX) 25 MG tablet.  Please call

## 2020-11-15 NOTE — Telephone Encounter (Signed)
Called patient and he said that he was down to 3 days of his topamax.  A one year prescription was sent in to [facility] 08/02/2020.  Received first 90 day fill.  I advised patient to call [facility] to ask for refill.  If there are any problems he will call us back.  Patient denied further questions, verbalized understanding and expressed appreciation for the phone call.

## 2020-11-26 DIAGNOSIS — G4733 Obstructive sleep apnea (adult) (pediatric): Secondary | ICD-10-CM | POA: Diagnosis not present

## 2020-11-26 DIAGNOSIS — R1030 Lower abdominal pain, unspecified: Secondary | ICD-10-CM | POA: Diagnosis not present

## 2020-11-26 DIAGNOSIS — I10 Essential (primary) hypertension: Secondary | ICD-10-CM | POA: Diagnosis not present

## 2020-12-26 DIAGNOSIS — I10 Essential (primary) hypertension: Secondary | ICD-10-CM | POA: Diagnosis not present

## 2020-12-26 DIAGNOSIS — E785 Hyperlipidemia, unspecified: Secondary | ICD-10-CM | POA: Diagnosis not present

## 2020-12-26 DIAGNOSIS — N183 Chronic kidney disease, stage 3 unspecified: Secondary | ICD-10-CM | POA: Diagnosis not present

## 2020-12-26 DIAGNOSIS — E1169 Type 2 diabetes mellitus with other specified complication: Secondary | ICD-10-CM | POA: Diagnosis not present

## 2021-03-05 DIAGNOSIS — G4733 Obstructive sleep apnea (adult) (pediatric): Secondary | ICD-10-CM | POA: Diagnosis not present

## 2021-03-12 ENCOUNTER — Telehealth: Payer: Self-pay | Admitting: Psychiatry

## 2021-03-12 ENCOUNTER — Telehealth: Payer: Self-pay | Admitting: Neurology

## 2021-03-12 NOTE — Progress Notes (Signed)
   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 He had been well controlled on Topamax and Lamictal for several years.  Interval History: Returns today because twinges are returning. They are triggered by touching his face, or winking with his left eye. This causes a quick electrical shock over his left eye lasting a second at a time. This is worst in the morning when he first gets up. No lacrimation or redness of his left eye (had this previously with more prolonged episodes).  He increased Topamax to 50 mg BID yesterday and hasn't noticed much of a difference yet. Is taking Lamictal 50 mg BID.    Current Headache Regimen: Preventative: Topamax 50 mg BID, Lamictal 50 mg BID   Prior Therapies                                  Topamax 150 mg BID Lamictal 100 mg BID  Physical Exam:   Vital Signs: BP 112/76   Pulse 74   Ht 5' 11.5" (1.816 m)   Wt 212 lb (96.2 kg)   SpO2 97%   BMI 29.16 kg/m  GENERAL:  well appearing, in no acute distress, alert  SKIN:  Color, texture, turgor normal. No rashes or lesions HEAD:  Normocephalic/atraumatic. RESP: normal respiratory effort MSK:  No gross joint deformities.   NEUROLOGICAL: Mental Status: Alert, oriented to person, place and time, Follows commands, and Speech fluent and appropriate. Cranial Nerves: PERRL, face symmetric, no dysarthria, hearing grossly intact Motor: moves all extremities equally Gait: normal-based.  IMPRESSION: 80 year old male with a history of diabetes who presents for follow up of SUNCT. He has been having an increase in twinges and would like to increase his medication . Will titrate Lamictal back up to 100 mg BID. Discussed that we can increase Topamax further if he does not have relief on the higher dose of Lamictal.  PLAN: -Increase Lamictal by 25 mg per week until up to 100 mg BID -Continue Topamax 50 mg BID for  now   Follow-up: 3 months  I spent a total of 18 minutes on the date of the service. Headache education was done.  Discussed medication side effects, adverse reactions and drug interactions. Written educational materials and patient instructions outlining all of the above were given.  Genia Harold, MD 03/13/21 9:31 AM

## 2021-03-12 NOTE — Telephone Encounter (Signed)
Called pt back. Relayed Dr. Georgina Peer message. He will take additional Topamax 25mg  tonight. He will discuss further w/ MD tomorrow. He will check in at 830am.

## 2021-03-12 NOTE — Telephone Encounter (Signed)
Pt called, having SUNCT for 3 days. Asking if can increased lamoTRIgine (LAMICTAL) 25 MG tablet and topiramate (TOPAMAX) 25 MG tablet.   Have reassigned Dr. Tobey Grim pt to Dr. Billey Gosling for SUNCT on 03/13/21 at 9:00 am.

## 2021-03-12 NOTE — Telephone Encounter (Signed)
He can take an extra 25 mg tablet of topiramate tonight and we can discuss his medications more in detail at his appointment tomorrow

## 2021-03-13 ENCOUNTER — Encounter: Payer: Self-pay | Admitting: Psychiatry

## 2021-03-13 ENCOUNTER — Other Ambulatory Visit: Payer: Self-pay

## 2021-03-13 ENCOUNTER — Ambulatory Visit (INDEPENDENT_AMBULATORY_CARE_PROVIDER_SITE_OTHER): Payer: Medicare Other | Admitting: Psychiatry

## 2021-03-13 VITALS — BP 112/76 | HR 74 | Ht 71.5 in | Wt 212.0 lb

## 2021-03-13 DIAGNOSIS — G44059 Short lasting unilateral neuralgiform headache with conjunctival injection and tearing (SUNCT), not intractable: Secondary | ICD-10-CM

## 2021-03-13 NOTE — Patient Instructions (Addendum)
Continue Topamax 50 mg twice a day for now Increase Lamictal to 50 mg in the morning and 75 mg in the evening for one week. Then increase to 75 mg twice a day for one week. Then take 75 mg in the morning and 100 mg at bedtime for one week. Then increase to 100 mg twice a day. Stop at any dose that improves your pain.

## 2021-03-19 DIAGNOSIS — C4441 Basal cell carcinoma of skin of scalp and neck: Secondary | ICD-10-CM | POA: Diagnosis not present

## 2021-03-19 DIAGNOSIS — L570 Actinic keratosis: Secondary | ICD-10-CM | POA: Diagnosis not present

## 2021-03-19 DIAGNOSIS — L814 Other melanin hyperpigmentation: Secondary | ICD-10-CM | POA: Diagnosis not present

## 2021-03-19 DIAGNOSIS — D225 Melanocytic nevi of trunk: Secondary | ICD-10-CM | POA: Diagnosis not present

## 2021-03-19 DIAGNOSIS — L821 Other seborrheic keratosis: Secondary | ICD-10-CM | POA: Diagnosis not present

## 2021-03-19 DIAGNOSIS — C44321 Squamous cell carcinoma of skin of nose: Secondary | ICD-10-CM | POA: Diagnosis not present

## 2021-03-19 DIAGNOSIS — Z85828 Personal history of other malignant neoplasm of skin: Secondary | ICD-10-CM | POA: Diagnosis not present

## 2021-03-19 DIAGNOSIS — D485 Neoplasm of uncertain behavior of skin: Secondary | ICD-10-CM | POA: Diagnosis not present

## 2021-03-19 DIAGNOSIS — Z86018 Personal history of other benign neoplasm: Secondary | ICD-10-CM | POA: Diagnosis not present

## 2021-03-19 DIAGNOSIS — L578 Other skin changes due to chronic exposure to nonionizing radiation: Secondary | ICD-10-CM | POA: Diagnosis not present

## 2021-03-19 DIAGNOSIS — Z23 Encounter for immunization: Secondary | ICD-10-CM | POA: Diagnosis not present

## 2021-05-31 DIAGNOSIS — M25551 Pain in right hip: Secondary | ICD-10-CM | POA: Diagnosis not present

## 2021-06-06 DIAGNOSIS — C44321 Squamous cell carcinoma of skin of nose: Secondary | ICD-10-CM | POA: Diagnosis not present

## 2021-06-20 DIAGNOSIS — C4441 Basal cell carcinoma of skin of scalp and neck: Secondary | ICD-10-CM | POA: Diagnosis not present

## 2021-06-25 DIAGNOSIS — H1045 Other chronic allergic conjunctivitis: Secondary | ICD-10-CM | POA: Diagnosis not present

## 2021-06-25 DIAGNOSIS — H2513 Age-related nuclear cataract, bilateral: Secondary | ICD-10-CM | POA: Diagnosis not present

## 2021-06-25 DIAGNOSIS — H35033 Hypertensive retinopathy, bilateral: Secondary | ICD-10-CM | POA: Diagnosis not present

## 2021-06-25 DIAGNOSIS — E119 Type 2 diabetes mellitus without complications: Secondary | ICD-10-CM | POA: Diagnosis not present

## 2021-07-03 DIAGNOSIS — E1169 Type 2 diabetes mellitus with other specified complication: Secondary | ICD-10-CM | POA: Diagnosis not present

## 2021-07-03 DIAGNOSIS — N183 Chronic kidney disease, stage 3 unspecified: Secondary | ICD-10-CM | POA: Diagnosis not present

## 2021-07-03 DIAGNOSIS — R946 Abnormal results of thyroid function studies: Secondary | ICD-10-CM | POA: Diagnosis not present

## 2021-07-03 DIAGNOSIS — E785 Hyperlipidemia, unspecified: Secondary | ICD-10-CM | POA: Diagnosis not present

## 2021-07-03 DIAGNOSIS — Z0000 Encounter for general adult medical examination without abnormal findings: Secondary | ICD-10-CM | POA: Diagnosis not present

## 2021-07-08 DIAGNOSIS — M1611 Unilateral primary osteoarthritis, right hip: Secondary | ICD-10-CM | POA: Diagnosis not present

## 2021-07-09 ENCOUNTER — Telehealth: Payer: Self-pay

## 2021-07-09 NOTE — Telephone Encounter (Signed)
? ?  Pre-operative Risk Assessment  ?  ?Patient Name: ED MANDICH  ?DOB: Daishaun Ayre 06, 1942 ?MRN: [id number]  ? ?  ? ?Request for Surgical Clearance   ? ?Procedure:   Right total hip arthroplasty ? ?Date of Surgery:  Clearance TBD                              ?   ?Surgeon:  Dr. Rod Can ?Surgeon's Group or Practice Name:  [facility] ?Phone number:  [phone] ?Fax number:  [phone] ?  ?Type of Clearance Requested:   ?- Medical  ?- Pharmacy:  Hold Aspirin   ?  ?Type of Anesthesia:  Spinal ?  ?Additional requests/questions:     ? ?Signed, ?Shammond Arave   ?07/09/2021, 5:35 PM  ? ?

## 2021-07-10 ENCOUNTER — Telehealth: Payer: Self-pay | Admitting: *Deleted

## 2021-07-10 DIAGNOSIS — G4733 Obstructive sleep apnea (adult) (pediatric): Secondary | ICD-10-CM | POA: Diagnosis not present

## 2021-07-10 DIAGNOSIS — E785 Hyperlipidemia, unspecified: Secondary | ICD-10-CM | POA: Diagnosis not present

## 2021-07-10 DIAGNOSIS — M25559 Pain in unspecified hip: Secondary | ICD-10-CM | POA: Diagnosis not present

## 2021-07-10 DIAGNOSIS — I700 Atherosclerosis of aorta: Secondary | ICD-10-CM | POA: Diagnosis not present

## 2021-07-10 DIAGNOSIS — E1169 Type 2 diabetes mellitus with other specified complication: Secondary | ICD-10-CM | POA: Diagnosis not present

## 2021-07-10 DIAGNOSIS — G44059 Short lasting unilateral neuralgiform headache with conjunctival injection and tearing (SUNCT), not intractable: Secondary | ICD-10-CM | POA: Diagnosis not present

## 2021-07-10 DIAGNOSIS — N183 Chronic kidney disease, stage 3 unspecified: Secondary | ICD-10-CM | POA: Diagnosis not present

## 2021-07-10 DIAGNOSIS — Z0000 Encounter for general adult medical examination without abnormal findings: Secondary | ICD-10-CM | POA: Diagnosis not present

## 2021-07-10 DIAGNOSIS — I10 Essential (primary) hypertension: Secondary | ICD-10-CM | POA: Diagnosis not present

## 2021-07-10 NOTE — Telephone Encounter (Signed)
Not on anticoagulation 

## 2021-07-10 NOTE — Telephone Encounter (Signed)
MED REC AND CONSENT ARE DONE.  ? ?  ?Patient Consent for Virtual Visit  ? ? ?   ? ?Christian Ross has provided verbal consent on 07/10/2021 for a virtual visit (video or telephone). ? ? ?CONSENT FOR VIRTUAL VISIT FOR:  Christian Ross  ?By participating in this virtual visit I agree to the following: ? ?I hereby voluntarily request, consent and authorize [facility] and its employed or contracted physicians, physician assistants, nurse practitioners or other licensed health care professionals (the Practitioner), to provide me with telemedicine health care services (the ?Services") as deemed necessary by the treating Practitioner. I acknowledge and consent to receive the Services by the Practitioner via telemedicine. I understand that the telemedicine visit will involve communicating with the Practitioner through live audiovisual communication technology and the disclosure of certain medical information by electronic transmission. I acknowledge that I have been given the opportunity to request an in-person assessment or other available alternative prior to the telemedicine visit and am voluntarily participating in the telemedicine visit. ? ?I understand that I have the right to withhold or withdraw my consent to the use of telemedicine in the course of my care at any time, without affecting my right to future care or treatment, and that the Practitioner or I may terminate the telemedicine visit at any time. I understand that I have the right to inspect all information obtained and/or recorded in the course of the telemedicine visit and may receive copies of available information for a reasonable fee.  I understand that some of the potential risks of receiving the Services via telemedicine include:  ?Delay or interruption in medical evaluation due to technological equipment failure or disruption; ?Information transmitted may not be sufficient (e.g. poor resolution of images) to allow for appropriate medical  decision making by the Practitioner; and/or  ?In rare instances, security protocols could fail, causing a breach of personal health information. ? ?Furthermore, I acknowledge that it is my responsibility to provide information about my medical history, conditions and care that is complete and accurate to the best of my ability. I acknowledge that Practitioner's advice, recommendations, and/or decision may be based on factors not within their control, such as incomplete or inaccurate data provided by me or distortions of diagnostic images or specimens that may result from electronic transmissions. I understand that the practice of medicine is not an exact science and that Practitioner makes no warranties or guarantees regarding treatment outcomes. I acknowledge that a copy of this consent can be made available to me via my patient portal ([facility]), or I can request a printed copy by calling the office of [facility].   ? ?I understand that my insurance will be billed for this visit.  ? ?I have read or had this consent read to me. ?I understand the contents of this consent, which adequately explains the benefits and risks of the Services being provided via telemedicine.  ?I have been provided ample opportunity to ask questions regarding this consent and the Services and have had my questions answered to my satisfaction. ?I give my informed consent for the services to be provided through the use of telemedicine in my medical care ? ? ? ?

## 2021-07-10 NOTE — Telephone Encounter (Signed)
Patient's aspirin is not prescribed by cardiology.  He will need to receive recommendations on holding aspirin by prescribing provider. ? ?Preoperative team, please contact this patient and set up a phone call appointment for further cardiac evaluation.  Thank you for your help. ? ?Jossie Ng. Deronte Solis NP-C ? ?  ?07/10/2021, 11:01 AM ?[facility] ?[address] ?Office [phone] Fax [phone] ? ?

## 2021-07-10 NOTE — Telephone Encounter (Signed)
Pt agreeable to tele pre op aptp 07/12/21 @ 9 am. Med rec and consent are done.  ?

## 2021-07-12 ENCOUNTER — Ambulatory Visit (INDEPENDENT_AMBULATORY_CARE_PROVIDER_SITE_OTHER): Payer: Medicare Other | Admitting: General Practice

## 2021-07-12 ENCOUNTER — Other Ambulatory Visit: Payer: Self-pay

## 2021-07-12 DIAGNOSIS — Z01810 Encounter for preprocedural cardiovascular examination: Secondary | ICD-10-CM

## 2021-07-12 NOTE — Progress Notes (Signed)
? ?Virtual Visit via Telephone Note  ? ?This visit type was conducted due to national recommendations for restrictions regarding the COVID-19 Pandemic (e.g. social distancing) in an effort to limit this patient's exposure and mitigate transmission in our community.  Due to his co-morbid illnesses, this patient is at least at moderate risk for complications without adequate follow up.  This format is felt to be most appropriate for this patient at this time.  The patient did not have access to video technology/had technical difficulties with video requiring transitioning to audio format only (telephone).  All issues noted in this document were discussed and addressed.  No physical exam could be performed with this format.  Please refer to the patient's chart for his  consent to telehealth for [facility]. ?Evaluation Performed:  Preoperative cardiovascular risk assessment ? ?This visit type was conducted due to national recommendations for restrictions regarding the COVID-19 Pandemic (e.g. social distancing).  This format is felt to be most appropriate for this patient at this time.  All issues noted in this document were discussed and addressed.  No physical exam was performed (except for noted visual exam findings with Video Visits).  Please refer to the patient's chart (MyChart message for video visits and phone note for telephone visits) for the patient's consent to telehealth for [facility]. ?_____________  ? ?Date:  07/12/2021  ? ?Patient ID:  Christian Ross, Christian Ross 79-Jan-1942, MRN [id number] ?Patient Location:  ?Home ?Provider location:   ?Office ? ?Primary Care Provider:  Juanell Fairly, MD (Inactive) ?Primary Cardiologist:  Mertie Moores, MD ? ?Chief Complaint  ?  ?81 y.o. y/o male with a h/o HTN, hyperlipidemia, who is pending right total hip arthroplasty, and presents today for telephonic preoperative cardiovascular risk assessment. ? ?Past Medical History  ?  ?Past Medical History:  ?Diagnosis  Date  ? Allergy   ? Arthritis   ? Cancer [facility])   ? skin pre cancer nose  ? Cough May 2016  ? new x20mo denies fever and shortness of breath  ? 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 Diverticulitis   ? Erectile dysfunction   ? Hemorrhoids   ? History of skin cancer   ? basal cell removed left arm 09/07/14  ? Hx of colonic polyps   ? Hyperlipidemia   ? Hypertension   ? orthostatic  ? Hypertriglyceridemia   ? Rotator cuff tear   ? left  ? Sleep apnea   ? cpap setting at 4   ? SUNCT 05/14/12  ? SUNCT followed by Dr. WJannifer Franklin ? Vitamin D deficiency   ? ?Past Surgical History:  ?Procedure Laterality Date  ? COLONOSCOPY W/ BIOPSIES AND POLYPECTOMY  multiple, 2005, 08, 14  ? KNEE SURGERY  1960  ? right / for [place] ? NASAL SINUS SURGERY    ? RECTAL BIOPSY    ? SHOULDER ARTHROSCOPY WITH ROTATOR CUFF REPAIR AND SUBACROMIAL DECOMPRESSION Right 12/14/2019  ? Procedure: Right shoulder mini open rotator cuff repair, subacromial decompression,;  Surgeon: BSusa Day MD;  Location: [facility];  Service: Orthopedics;  Laterality: Right;  2 hrs ?Choice with Block  ? SHOULDER OPEN ROTATOR CUFF REPAIR Left 09/14/2014  ? Procedure: LEFT SHOULDER MINI OPEN ROTATOR CUFF REPAIR AND SUBACROMIAL DECOMPRESSION ;  Surgeon: JSusa Day MD;  Location: [facility];  Service: Orthopedics;  Laterality: Left;  ? ? ?Allergies ? ?Allergies  ?Allergen Reactions  ? Lisinopril Cough  ? ? ?History of Present  Illness  ?  ?Christian Ross is a 81 y.o. male who presents via audio/video conferencing for a telehealth visit today.  Pt was last seen in cardiology clinic on 07/30/2020, by Dr. Acie Fredrickson.  At that time Christian Ross was doing well .  he is now pending right total hip arthroplasty.  Since his last visit, he remains stable from a cardiac standpoint. ? ?Today he denies chest pain, shortness of breath, lower extremity edema, fatigue, palpitations, melena, hematuria, hemoptysis, diaphoresis, weakness, presyncope, syncope,  orthopnea, and PND. ? ?Home Medications  ?  ?Prior to Admission medications   ?Medication Sig Start Date End Date Taking? Authorizing Provider  ?Ascorbic Acid (VITAMIN C PO) Take 3 g by mouth every morning. ?Patient not taking: Reported on 07/10/2021    [provider]  ?Ascorbic Acid (VITAMIN C) 1000 MG tablet Take 3,000 mg by mouth daily.    [provider]  ?aspirin 81 MG tablet Take 81 mg by mouth every evening.    [provider]  ?Berberine Chloride POWD Take 800 mg by mouth every morning.    [provider]  ?fenofibrate (TRICOR) 145 MG tablet Take 1 tablet (145 mg total) by mouth daily. 07/30/20   Nahser, Wonda Cheng, MD  ?glipiZIDE (GLUCOTROL XL) 10 MG 24 hr tablet Take 10 mg by mouth daily with breakfast.  11/08/18   [provider]  ?Glucosamine-Chondroit-Vit C-Mn (GLUCOSAMINE 1500 COMPLEX) CAPS Take 2 tablets by mouth daily.    [provider]  ?lamoTRIgine (LAMICTAL) 25 MG tablet Take 2 tablets (50 mg total) by mouth 2 (two) times daily. ?Patient taking differently: Take 100 mg by mouth 2 (two) times daily. 08/02/20   Suzzanne Cloud, NP  ?Lecithin 1200 MG CAPS Take 2,400 mg by mouth every morning.     [provider]  ?metoprolol tartrate (LOPRESSOR) 25 MG tablet Take 1 tablet (25 mg total) by mouth 2 (two) times daily. 07/30/20   Nahser, Wonda Cheng, MD  ?mometasone (NASONEX) 50 MCG/ACT nasal spray Place 2 sprays into the nose daily as needed (allergies).    [provider]  ?OVER THE COUNTER MEDICATION Take 2 tablets by mouth every morning. [employer], Historical, MD  ?Saw Palmetto, Serenoa repens, (SAW PALMETTO PO) Take 2 capsules by mouth in the morning and at bedtime.     [provider]  ?topiramate (TOPAMAX) 25 MG tablet Take 1 tablet (25 mg total) by mouth 2 (two) times daily. 08/02/20   Suzzanne Cloud, NP  ?TURMERIC PO Take 2 capsules by mouth in the morning and at bedtime.    [provider]  ?Ubiquinol  200 MG CAPS Take 400 mg by mouth in the morning and at bedtime.     [provider]  ?VASCEPA 1 g capsule TAKE 2 CAPSULES TWICE A DAY 07/20/20   Nahser, Wonda Cheng, MD  ?irbesartan (AVAPRO) 150 MG tablet Take 1 tablet (150 mg total) by mouth daily. 06/24/11 06/26/11  Nahser, Wonda Cheng, MD  ? ? ?Physical Exam  ?  ?Vital Signs:  Christian Ross does not have vital signs available for review today. ? ?Given telephonic nature of communication, physical exam is limited. ?AAOx3. NAD. Normal affect.  Speech and respirations are unlabored. ? ?Accessory Clinical Findings  ?  ?None ? ?Assessment & Plan  ?  ?1.  Preoperative Cardiovascular Risk Assessment: ? ?  ? ?Primary Cardiologist: Mertie Moores, MD ? ?Chart reviewed as part of pre-operative protocol coverage.  Given past medical history and time since last visit, based on ACC/AHA guidelines, Christian Ross would be at acceptable risk for the planned procedure without further cardiovascular testing.  ? ?Patient was advised that if he develops new symptoms prior to surgery to contact our office to arrange a follow-up appointment.  He verbalized understanding. ? ?Patient's aspirin is not prescribed by cardiology.  He will need to receive recommendations on holding aspirin by prescribing provider. ? ?His RCRI is a class I risk, 0.4% risk of major cardiac event.  He is able to complete greater than 4 METS of physical activity. ? ? ?COVID-19 Education: ?The signs and symptoms of COVID-19 were discussed with the patient and how to seek care for testing (follow up with PCP or arrange E-visit).  The importance of social distancing was discussed today. ? ?Patient Risk:   ?After full review of this patient's history and clinical status, I feel that he is at least moderate risk for cardiac complications at this time, thus necessitating a telehealth visit sooner than our first available in office visit. ? ?Time:   ?Today, I have spent 5 minutes with the patient with telehealth  technology discussing medical history, symptoms, and management plan.  I spent greater than 10 minutes prior to the phone call reviewing the patient's past medical history and medications. ? ? ?Christian Ross

## 2021-07-15 ENCOUNTER — Other Ambulatory Visit: Payer: Self-pay | Admitting: Cardiovascular Disease

## 2021-07-15 MED ORDER — ICOSAPENT ETHYL 1 G PO CAPS: 2.0000 g | ORAL_CAPSULE | Freq: Two times a day (BID) | ORAL | 3 refills | Status: DC

## 2021-07-29 NOTE — Telephone Encounter (Signed)
? ?  Primary Cardiologist: Mertie Moores, MD ? ?Chart reviewed as part of pre-operative protocol coverage. Given past medical history and time since last visit, based on ACC/AHA guidelines, Christian Ross would be at acceptable risk for the planned procedure without further cardiovascular testing.  ? ?Please additionally see preoperative cardiovascular exam from 07/12/21.  ? ?I spoke with patient on 07/29/21 to clarify the question of the indication for taking aspirin. He states he has no idea why he is taking aspirin. I advised that it is possible that it was prescribed for aortic atherosclerosis. He is not aware of any other indication for taking aspirin. I advised that he may hold his aspirin for 7 days prior to surgery and resume as soon as he is hemodynamically stable.  ? ?Emmaline Life, NP-C ? ?  ?07/29/2021, 12:04 PM ?[facility] ?[address] ?Office [phone] Fax [phone] ? ? ?

## 2021-07-29 NOTE — Telephone Encounter (Signed)
Christian Ross from [facility] states the patient's PCP wants cardiology to give approval to hold aspirin. Phone: [phone], okay to leave call back number. Fax: [phone] ? ?

## 2021-08-06 NOTE — Progress Notes (Signed)
? ? ?Patient: Christian Ross ?Date of Birth: 07-02-1940 ? ?Reason for Visit: Follow up for left SUNCT headache  ?History from: Patient ?Primary Neurologist: Dr. Billey Gosling  ? ?ASSESSMENT AND PLAN ?81 y.o. year old male  ? ?Left sided SUNCT headache ? ?-Pain under excellent control ?-Reduce dose of Lamictal back to 75 mg twice daily ?-Continue Topamax 50 mg twice daily ?-Call for dose adjustment, follow-up 6 months or sooner if needed ? ?HISTORY OF PRESENT ILLNESS: ?Today 08/07/21 ?Mr. Mckowen here today for follow-up.  On Lamictal and Topamax. Taking Lamictal 100 mg twice daily, Topamax 50 mg twice daily. Having hardly any pain at all, he has to search for it. Doesn't have any side effect from medications. Having right hip replacement in 2 weeks. PCP follows labs.  ? ?HISTORY  ?03/13/2021 Dr. Billey Gosling: Returns today because twinges are returning. They are triggered by touching his face, or winking with his left eye. This causes a quick electrical shock over his left eye lasting a second at a time. This is worst in the morning when he first gets up. No lacrimation or redness of his left eye (had this previously with more prolonged episodes). ?  ?He increased Topamax to 50 mg BID yesterday and hasn't noticed much of a difference yet. Is taking Lamictal 50 mg BID. ? ?REVIEW OF SYSTEMS: Out of a complete 14 system review of symptoms, the patient complains only of the following symptoms, and all other reviewed systems are negative. ? ?See HPI ? ?ALLERGIES: ?Allergies  ?Allergen Reactions  ? Lisinopril Cough  ? ? ?HOME MEDICATIONS: ?Outpatient Medications Prior to Visit  ?Medication Sig Dispense Refill  ? Ascorbic Acid (VITAMIN C PO) Take 3 g by mouth every morning.    ? Ascorbic Acid (VITAMIN C) 1000 MG tablet Take 3,000 mg by mouth daily.    ? aspirin 81 MG tablet Take 81 mg by mouth every evening.    ? Berberine Chloride POWD Take 800 mg by mouth every morning.    ? fenofibrate (TRICOR) 145 MG tablet Take 1 tablet (145  mg total) by mouth daily. 90 tablet 3  ? glipiZIDE (GLUCOTROL XL) 10 MG 24 hr tablet Take 10 mg by mouth daily with breakfast.     ? Glucosamine-Chondroit-Vit C-Mn (GLUCOSAMINE 1500 COMPLEX) CAPS Take 2 tablets by mouth daily.    ? icosapent Ethyl (VASCEPA) 1 g capsule Take 2 capsules (2 g total) by mouth 2 (two) times daily. 360 capsule 3  ? Lecithin 1200 MG CAPS Take 2,400 mg by mouth every morning.     ? metoprolol tartrate (LOPRESSOR) 25 MG tablet Take 1 tablet (25 mg total) by mouth 2 (two) times daily. 180 tablet 3  ? mometasone (NASONEX) 50 MCG/ACT nasal spray Place 2 sprays into the nose daily as needed (allergies).    ? OVER THE COUNTER MEDICATION Take 2 tablets by mouth every morning. [place]    ? Saw Palmetto, Serenoa repens, (SAW PALMETTO PO) Take 2 capsules by mouth in the morning and at bedtime.     ? TURMERIC PO Take 2 capsules by mouth in the morning and at bedtime.    ? Ubiquinol 200 MG CAPS Take 400 mg by mouth in the morning and at bedtime.     ? lamoTRIgine (LAMICTAL) 25 MG tablet Take 2 tablets (50 mg total) by mouth 2 (two) times daily. (Patient taking differently: Take 100 mg by mouth 2 (two) times daily.) 360 tablet 3  ? topiramate (TOPAMAX) 25 MG  tablet Take 1 tablet (25 mg total) by mouth 2 (two) times daily. 180 tablet 3  ? ?No facility-administered medications prior to visit.  ? ? ?PAST MEDICAL HISTORY: ?Past Medical History:  ?Diagnosis Date  ? Allergy   ? Arthritis   ? Cancer [facility])   ? skin pre cancer nose  ? Cough May 2016  ? new x59mo denies fever and shortness of breath  ? Diabetes mellitus without complication ([place]   ? type 2   ? Diverticulitis   ? Erectile dysfunction   ? Hemorrhoids   ? History of skin cancer   ? basal cell removed left arm 09/07/14  ? Hx of colonic polyps   ? Hyperlipidemia   ? Hypertension   ? orthostatic  ? Hypertriglyceridemia   ? Rotator cuff tear   ? left  ? Sleep apnea   ? cpap setting at 4   ? SUNCT 05/14/12  ? SUNCT followed by Dr. WJannifer Franklin ? Vitamin  D deficiency   ? ? ?PAST SURGICAL HISTORY: ?Past Surgical History:  ?Procedure Laterality Date  ? COLONOSCOPY W/ BIOPSIES AND POLYPECTOMY  multiple, 2005, 08, 14  ? KNEE SURGERY  1960  ? right / for o[place] ? NASAL SINUS SURGERY    ? RECTAL BIOPSY    ? SHOULDER ARTHROSCOPY WITH ROTATOR CUFF REPAIR AND SUBACROMIAL DECOMPRESSION Right 12/14/2019  ? Procedure: Right shoulder mini open rotator cuff repair, subacromial decompression,;  Surgeon: BSusa Day MD;  Location: [facility];  Service: Orthopedics;  Laterality: Right;  2 hrs ?Choice with Block  ? SHOULDER OPEN ROTATOR CUFF REPAIR Left 09/14/2014  ? Procedure: LEFT SHOULDER MINI OPEN ROTATOR CUFF REPAIR AND SUBACROMIAL DECOMPRESSION ;  Surgeon: JSusa Day MD;  Location: [facility];  Service: Orthopedics;  Laterality: Left;  ? ? ?FAMILY HISTORY: ?Family History  ?Problem Relation Age of Onset  ? Colon cancer Neg Hx   ? Colon polyps Neg Hx   ? Esophageal cancer Neg Hx   ? Rectal cancer Neg Hx   ? Stomach cancer Neg Hx   ? ? ?SOCIAL HISTORY: ?Social History  ? ?Socioeconomic History  ? Marital status: Married  ?  Spouse name: Not on file  ? Number of children: 2  ? Years of education: college  ? Highest education level: Not on file  ?Occupational History  ? Occupation: PInsurance underwriter ?  Employer: RETIRED  ?Tobacco Use  ? Smoking status: Never  ? Smokeless tobacco: Never  ?Vaping Use  ? Vaping Use: Never used  ?Substance and Sexual Activity  ? Alcohol use: Yes  ?  Comment: rarely  ? Drug use: No  ? Sexual activity: Never  ?Other Topics Concern  ? Not on file  ?Social History Narrative  ? Lives at home w/ wife  ? Patient drinks a couple of sodas a week.  ? Patient is right handed.   ? ?Social Determinants of Health  ? ?Financial Resource Strain: Not on file  ?Food Insecurity: Not on file  ?Transportation Needs: Not on file  ?Physical Activity: Not on file  ?Stress: Not on file  ?Social Connections: Not on file  ?Intimate Partner Violence: Not on file  ? ? ?PHYSICAL  EXAM ? ?Vitals:  ? 08/07/21 0903  ?BP: 120/72  ?Pulse: 64  ?Weight: 207 lb (93.9 kg)  ?Height: '5\' 11"'$  (1.803 m)  ? ?Body mass index is 28.87 kg/m?. ? ?Generalized: Well developed, in no acute distress  ?Neurological examination  ?Mentation: Alert oriented to time,  place, history taking. Follows all commands speech and language fluent ?Cranial nerve II-XII: Pupils were equal round reactive to light. Extraocular movements were full, visual field were full on confrontational test. Facial sensation and strength were normal. Head turning and shoulder shrug  were normal and symmetric. ?Motor: The motor testing reveals 5 over 5 strength of all 4 extremities. Good symmetric motor tone is noted throughout.  ?Sensory: Sensory testing is intact to soft touch on all 4 extremities. No evidence of extinction is noted.  ?Coordination: Cerebellar testing reveals good finger-nose-finger and heel-to-shin bilaterally.  ?Gait and station: Gait is slightly wide-based, independent ?Reflexes: Deep tendon reflexes are symmetric and normal bilaterally.  ? ?DIAGNOSTIC DATA (LABS, IMAGING, TESTING) ?- I reviewed patient records, labs, notes, testing and imaging myself where available. ? ?Lab Results  ?Component Value Date  ? WBC 6.4 12/09/2019  ? HGB 14.5 12/09/2019  ? HCT 44.2 12/09/2019  ? MCV 99.3 12/09/2019  ? PLT 267 12/09/2019  ? ?   ?Component Value Date/Time  ? NA 140 06/12/2020 0806  ? K 4.3 06/12/2020 0806  ? CL 106 06/12/2020 0806  ? CO2 18 (L) 06/12/2020 0806  ? GLUCOSE 145 (H) 06/12/2020 0806  ? GLUCOSE 146 (H) 12/09/2019 0944  ? BUN 23 06/12/2020 0806  ? CREATININE 1.38 (H) 06/12/2020 0177  ? CALCIUM 9.4 06/12/2020 0806  ? PROT 7.3 06/12/2020 0806  ? ALBUMIN 4.4 06/12/2020 0806  ? AST 25 06/12/2020 0806  ? ALT 27 06/12/2020 0806  ? ALKPHOS 46 06/12/2020 0806  ? BILITOT 0.4 06/12/2020 0806  ? GFRNONAA 49 (L) 06/12/2020 0806  ? GFRAA 57 (L) 06/12/2020 0806  ? ?Lab Results  ?Component Value Date  ? CHOL 91 (L) 09/18/2020  ? HDL 35  (L) 09/18/2020  ? [place] 27 09/18/2020  ? LDLDIRECT 98.0 01/15/2015  ? TRIG 178 (H) 09/18/2020  ? CHOLHDL 2.6 09/18/2020  ? ?Lab Results  ?Component Value Date  ? HGBA1C 7.6 (H) 07/23/2016  ? ?No results

## 2021-08-07 ENCOUNTER — Ambulatory Visit (INDEPENDENT_AMBULATORY_CARE_PROVIDER_SITE_OTHER): Payer: Medicare Other | Admitting: Neurology

## 2021-08-07 ENCOUNTER — Other Ambulatory Visit: Payer: Self-pay | Admitting: Cardiovascular Disease

## 2021-08-07 VITALS — BP 120/72 | HR 64 | Ht 71.0 in | Wt 207.0 lb

## 2021-08-07 DIAGNOSIS — G44059 Short lasting unilateral neuralgiform headache with conjunctival injection and tearing (SUNCT), not intractable: Secondary | ICD-10-CM

## 2021-08-07 MED ORDER — TOPIRAMATE 50 MG PO TABS: 50.0000 mg | ORAL_TABLET | Freq: Two times a day (BID) | ORAL | 3 refills | Status: DC

## 2021-08-07 MED ORDER — LAMOTRIGINE 25 MG PO TABS: 75.0000 mg | ORAL_TABLET | Freq: Two times a day (BID) | ORAL | 3 refills | Status: DC

## 2021-08-07 NOTE — Patient Instructions (Signed)
Try to cut back Lamictal to 75 mg twice daily  ?Continue Topamax 50 mg twice daily ?Call for any dose adjustment  ?Return back in 6 months ?

## 2021-08-07 NOTE — Progress Notes (Signed)
Sent message, via epic in basket, requesting orders in epic from surgeon.  

## 2021-08-09 ENCOUNTER — Ambulatory Visit: Payer: Self-pay | Admitting: Student

## 2021-08-09 DIAGNOSIS — E1165 Type 2 diabetes mellitus with hyperglycemia: Secondary | ICD-10-CM

## 2021-08-12 ENCOUNTER — Ambulatory Visit: Payer: Self-pay | Admitting: Student

## 2021-08-12 NOTE — H&P (Signed)
TOTAL HIP ADMISSION H&P ? ?Patient is admitted for right total hip arthroplasty. ? ?Subjective: ? ?Chief Complaint: right hip pain ? ?HPI: Christian Ross, 81 y.o. male, has a history of pain and functional disability in the right hip(s) due to arthritis and patient has failed non-surgical conservative treatments for greater than 12 weeks to include NSAID's and/or analgesics, flexibility and strengthening excercises, use of assistive devices, and activity modification.  Onset of symptoms was gradual starting 5 years ago with rapidlly worsening course since that time.The patient noted no past surgery on the right hip(s).  Patient currently rates pain in the right hip at 6 out of 10 with activity. Patient has night pain, worsening of pain with activity and weight bearing, pain that interfers with activities of daily living, and pain with passive range of motion. Patient has evidence of subchondral cysts, subchondral sclerosis, periarticular osteophytes, and joint space narrowing by imaging studies. This condition presents safety issues increasing the risk of falls. There is no current active infection. ? ?Patient Active Problem List  ? Diagnosis Date Noted  ? Allergic rhinitis 06/25/2020  ? Chronic kidney disease, stage 3 unspecified ([place]) 06/25/2020  ? Hardening of the aorta (main artery of the heart) ([place]) 06/25/2020  ? Hypertensive retinopathy 06/25/2020  ? Other specified abnormal findings of blood chemistry 06/25/2020  ? Type 2 diabetes mellitus with other specified complication ([place]) 17/91/5056  ? Sleep apnea   ? Insulin resistance   ? Hypertriglyceridemia   ? Hypertension   ? History of skin cancer   ? Diverticulosis   ? Diverticulitis   ? Cancer [facility])   ? Borderline diabetes   ? Arthritis   ? Left rotator cuff tear 09/14/2014  ? Rotator cuff tear 09/14/2014  ? Cough 08/20/2014  ? SUNCT (short unilateral neuralgiform headache, conjunctival inj/tear) 04/07/2013  ? Hyperglycemia 01/03/2013  ? Personal history  of colonic polyps 01/20/2011  ? Diverticulosis of colon 01/20/2011  ? Hyperlipidemia 01/02/2011  ? HTN (hypertension) 01/02/2011  ? ?Past Medical History:  ?Diagnosis Date  ? Allergy   ? Arthritis   ? Cancer [facility])   ? skin pre cancer nose  ? Cough May 2016  ? new x40mo denies fever and shortness of breath  ? Diabetes [place]   ? type 2   ? Diverticulitis   ? Erectile dysfunction   ? Hemorrhoids   ? History of skin cancer   ? basal cell removed left arm 09/07/14  ? Hx of colonic polyps   ? Hyperlipidemia   ? Hypertension   ? orthostatic  ? Hypertriglyceridemia   ? Rotator cuff tear   ? left  ? Sleep apnea   ? cpap setting at 4   ? SUNCT 05/14/12  ? SUNCT followed by Dr. WJannifer Franklin ? Vitamin D deficiency   ?  ?Past Surgical History:  ?Procedure Laterality Date  ? COLONOSCOPY W/ BIOPSIES AND POLYPECTOMY  multiple, 2005, 08, 14  ? KNEE SURGERY  1960  ? right / for o[place] ? NASAL SINUS SURGERY    ? RECTAL BIOPSY    ? SHOULDER ARTHROSCOPY WITH ROTATOR CUFF REPAIR AND SUBACROMIAL DECOMPRESSION Right 12/14/2019  ? Procedure: Right shoulder mini open rotator cuff repair, subacromial decompression,;  Surgeon: BSusa Day MD;  Location: [facility];  Service: Orthopedics;  Laterality: Right;  2 hrs ?Choice with Block  ? SHOULDER OPEN ROTATOR CUFF REPAIR Left 09/14/2014  ? Procedure: LEFT SHOULDER MINI OPEN ROTATOR CUFF REPAIR AND SUBACROMIAL DECOMPRESSION ;  Surgeon: Susa Day, MD;  Location: [facility];  Service: Orthopedics;  Laterality: Left;  ?  ?Current Outpatient Medications  ?Medication Sig Dispense Refill Last Dose  ? Ascorbic Acid (VITAMIN C) 1000 MG tablet Take 1,000 mg by mouth daily.     ? aspirin 81 MG tablet Take 81 mg by mouth every evening.     ? Berberine Chloride POWD Take 800 mg by mouth every morning. 2 caps every morning     ? fenofibrate (TRICOR) 145 MG tablet Take 1 tablet (145 mg total) by mouth daily. 90 tablet 3   ? glipiZIDE (GLUCOTROL XL) 10 MG 24 hr tablet Take 10  mg by mouth daily with breakfast.      ? Glucosamine-Chondroit-Vit C-Mn (GLUCOSAMINE 1500 COMPLEX) CAPS Take 2 tablets by mouth daily.     ? icosapent Ethyl (VASCEPA) 1 g capsule Take 2 capsules (2 g total) by mouth 2 (two) times daily. 360 capsule 3   ? lamoTRIgine (LAMICTAL) 25 MG tablet Take 3 tablets (75 mg total) by mouth 2 (two) times daily. 540 tablet 3   ? Lecithin 1200 MG CAPS Take 2,400 mg by mouth every morning.      ? metoprolol tartrate (LOPRESSOR) 25 MG tablet TAKE 1 TABLET TWICE A DAY 180 tablet 3   ? mometasone (NASONEX) 50 MCG/ACT nasal spray Place 2 sprays into the nose daily as needed (allergies).     ? OVER THE COUNTER MEDICATION Take 2 capsules by mouth every morning. [place]     ? Saw Palmetto, Serenoa repens, (SAW PALMETTO PO) Take 2 capsules by mouth in the morning and at bedtime.      ? topiramate (TOPAMAX) 50 MG tablet Take 1 tablet (50 mg total) by mouth 2 (two) times daily. 180 tablet 3   ? TURMERIC PO Take 2 capsules by mouth in the morning and at bedtime.     ? Ubiquinol 200 MG CAPS Take 400 mg by mouth in the morning and at bedtime.      ? ?No current facility-administered medications for this visit.  ? ?Allergies  ?Allergen Reactions  ? Lisinopril Cough  ?  ?Social History  ? ?Tobacco Use  ? Smoking status: Never  ? Smokeless tobacco: Never  ?Substance Use Topics  ? Alcohol use: Yes  ?  Comment: rarely  ?  ?Family History  ?Problem Relation Age of Onset  ? Colon cancer Neg Hx   ? Colon polyps Neg Hx   ? Esophageal cancer Neg Hx   ? Rectal cancer Neg Hx   ? Stomach cancer Neg Hx   ?  ? ?Review of Systems  ?Musculoskeletal:  Positive for arthralgias, gait problem and myalgias.  ?All other systems reviewed and are negative. ? ?Objective: ? ?Physical Exam ?Constitutional:   ?   Appearance: Normal appearance.  ?HENT:  ?   Head: Normocephalic and atraumatic.  ?   Nose: Nose normal.  ?   Mouth/Throat:  ?   Mouth: Mucous membranes are moist.  ?   Pharynx: Oropharynx is clear.  ?Eyes:  ?    Extraocular Movements: Extraocular movements intact.  ?Cardiovascular:  ?   Rate and Rhythm: Normal rate and regular rhythm.  ?   Pulses: Normal pulses.  ?   Heart sounds: Normal heart sounds.  ?Pulmonary:  ?   Effort: Pulmonary effort is normal.  ?   Breath sounds: Normal breath sounds.  ?Abdominal:  ?   General: Abdomen is flat.  ?   Palpations:  Abdomen is soft.  ?Genitourinary: ?   Comments: deferred ?Musculoskeletal:  ?   Cervical back: Normal range of motion and neck supple.  ?   Right hip: Tenderness present. Decreased range of motion.  ?   Left hip: Normal.  ?Neurological:  ?   Mental Status: He is alert.  ? ? ?Vital signs in last 24 hours: ?'@VSRANGES'$ @ ? ?Labs: ? ? ?Estimated body mass index is 28.87 kg/m? as calculated from the following: ?  Height as of 08/07/21: '5\' 11"'$  (1.803 m). ?  Weight as of 08/07/21: 93.9 kg. ? ? ?Imaging Review ?Plain radiographs demonstrate severe degenerative joint disease of the right hip(s). The bone quality appears to be adequate for age and reported activity level. ? ? ? ? ? ?Assessment/Plan: ? ?End stage arthritis, right hip(s) ? ?The patient history, physical examination, clinical judgement of the provider and imaging studies are consistent with end stage degenerative joint disease of the right hip(s) and total hip arthroplasty is deemed medically necessary. The treatment options including medical management, injection therapy, arthroscopy and arthroplasty were discussed at length. The risks and benefits of total hip arthroplasty were presented and reviewed. The risks due to aseptic loosening, infection, stiffness, dislocation/subluxation,  thromboembolic complications and other imponderables were discussed.  The patient acknowledged the explanation, agreed to proceed with the plan and consent was signed. Patient is being admitted for inpatient treatment for surgery, pain control, PT, OT, prophylactic antibiotics, VTE prophylaxis, progressive ambulation and ADL's and discharge  planning.The patient is planning to be discharged home with HEP. ? ?Therapy Plans: HEP. Discussed the importance of hip abductor strengthening with 150 standing side leg raises and non- pounding exer

## 2021-08-12 NOTE — Progress Notes (Addendum)
Anesthesia Review: ? ?PCP: DR London Pepper  [place] 07/22/21 on chart  LOV 07/03/21  ?Cardiologist : dr Mertie Moores Clearance  Christen Bame, NP- dated 07/09/21 on chart  ?LOV 3/24/23Coletta Memos, NP  ?Neurology- for SUNCT on 08/07/21 with  Cristal Generous  ?Chest x-ray : ?EKG : 4.26.23  ?Echo : ?Stress test: ?Cardiac Cath :  ?Activity level: can do a flight of stairs without difficulty  ?Sleep Study/ CPAP : has cpap  ?Fasting Blood Sugar :      / Checks Blood Sugar -- times a day:   ?Blood Thinner/ Instructions /Last Dose: ?ASA / Instructions/ Last Dose :   ?81 MG ASPIRIN  ?DM- TYPE 2 ?Checks glucose can be 2-3 x daily or 2-3 times per week  ?HGBA1C- 08/14/21 -  6.0  ?In regards to preop instructions pt stated he will probably do a [place]el instead of the G2 lower sugar.   ?BMP done 08/14/21 routed to DR Swinteck on 08/14/21.  ? ?

## 2021-08-12 NOTE — Progress Notes (Signed)
DUE TO COVID-19 ONLY  2  VISITOR IS ALLOWED TO COME WITH YOU AND STAY IN THE WAITING ROOM ONLY DURING PRE OP AND PROCEDURE DAY OF SURGERY.   4  VISITOR  MAY VISIT WITH YOU AFTER SURGERY IN YOUR PRIVATE ROOM DURING VISITING HOURS ONLY! ?YOU MAY HAVE ONE PERSON SPEND THE NITE WITH YOU IN YOUR ROOM AFTER SURGERY.   ? ? ? ? Your procedure is scheduled on:  ?  08/22/2021.  ? Report to [facility] Main  Entrance ? ? Report to admitting at      0515          AM ?DO NOT [place], PICTURE ID OR WALLET DAY OF SURGERY.  ?  ? ? Call this number if you have problems the morning of surgery [phone]  ? ? REMEMBER: NO  SOLID FOODS , CANDY, GUM OR MINTS AFTER MIDNITE THE NITE BEFORE SURGERY .       Marland Kitchen CLEAR LIQUIDS UNTIL     0430AM            DAY OF SURGERY.      PLEASE FINISH  G2 LOWER SUGAR  DRINK PER SURGEON ORDER  WHICH NEEDS TO BE COMPLETED AT    0430AM       MORNING OF SURGERY.   ? ? ? ? ?CLEAR LIQUID DIET ? ? ?Foods Allowed      ?WATER ?BLACK COFFEE ( SUGAR OK, NO MILK, CREAM OR CREAMER) REGULAR AND DECAF  ?TEA ( SUGAR OK NO MILK, CREAM, OR CREAMER) REGULAR AND DECAF  ?PLAIN JELLO ( NO RED)  ?FRUIT ICES ( NO RED, NO FRUIT PULP)  ?POPSICLES ( NO RED)  ?JUICE- APPLE, WHITE GRAPE AND WHITE CRANBERRY  ?SPORT DRINK LIKE GATORADE ( NO RED)  ?CLEAR BROTH ( VEGETABLE , CHICKEN OR BEEF)                                                               ? ?    ? ?BRUSH YOUR TEETH MORNING OF SURGERY AND RINSE YOUR MOUTH OUT, NO CHEWING GUM CANDY OR MINTS. ?  ? ? Take these medicines the morning of surgery with A SIP OF WATER:  LAMICTAL, METOPROLOL, TOPAMAX  ? ? ?DO NOT TAKE ANY DIABETIC MEDICATIONS DAY OF YOUR SURGERY ?                  ?            You may not have any metal on your body including hair pins and  ?            piercings  Do not wear jewelry, make-up, lotions, powders or perfumes, deodorant ?            Do not wear nail polish on your fingernails.   ?           IF YOU ARE A MALE AND WANT TO SHAVE UNDER  ARMS OR LEGS PRIOR TO SURGERY YOU MUST DO SO AT LEAST 48 HOURS PRIOR TO SURGERY.  ?            Men may shave face and neck. ? ? Do not bring valuables to the hospital. [facility] NOT ?  RESPONSIBLE   FOR VALUABLES. ? Contacts, dentures or bridgework may not be worn into surgery. ? Leave suitcase in the car. After surgery it may be brought to your room. ? ?  ? Patients discharged the day of surgery will not be allowed to drive home. IF YOU ARE HAVING SURGERY AND GOING HOME THE SAME DAY, YOU MUST HAVE AN ADULT TO DRIVE YOU HOME AND BE WITH YOU FOR 24 HOURS. YOU MAY GO HOME BY TAXI OR UBER OR ORTHERWISE, BUT AN ADULT MUST ACCOMPANY YOU HOME AND STAY WITH YOU FOR 24 HOURS. ?  ? ?            Please read over the following fact sheets you were given: ?_____________________________________________________________________ ? ?[facility] - Preparing for Surgery ?Before surgery, you can play an important role.  Because skin is not sterile, your skin needs to be as free of germs as possible.  You can reduce the number of germs on your skin by washing with CHG (chlorahexidine gluconate) soap before surgery.  CHG is an antiseptic cleaner which kills germs and bonds with the skin to continue killing germs even after washing. ?Please DO NOT use if you have an allergy to CHG or antibacterial soaps.  If your skin becomes reddened/irritated stop using the CHG and inform your nurse when you arrive at Short Stay. ?Do not shave (including legs and underarms) for at least 48 hours prior to the first CHG shower.  You may shave your face/neck. ?Please follow these instructions carefully: ? 1.  Shower with CHG Soap the night before surgery and the  morning of Surgery. ? 2.  If you choose to wash your hair, wash your hair first as usual with your  normal  shampoo. ? 3.  After you shampoo, rinse your hair and body thoroughly to remove the  shampoo.                           4.  Use CHG as you would any other liquid soap.  You  can apply chg directly  to the skin and wash  ?                     Gently with a scrungie or clean washcloth. ? 5.  Apply the CHG Soap to your body ONLY FROM THE NECK DOWN.   Do not use on face/ open      ?                     Wound or open sores. Avoid contact with eyes, ears mouth and genitals (private parts).  ?                     Production manager,  Genitals (private parts) with your normal soap. ?            6.  Wash thoroughly, paying special attention to the area where your surgery  will be performed. ? 7.  Thoroughly rinse your body with warm water from the neck down. ? 8.  DO NOT shower/wash with your normal soap after using and rinsing off  the CHG Soap. ?               9.  Pat yourself dry with a clean towel. ?           10.  Wear clean pajamas. ?  11.  Place clean sheets on your bed the night of your first shower and do not  sleep with pets. ?Day of Surgery : ?Do not apply any lotions/deodorants the morning of surgery.  Please wear clean clothes to the hospital/surgery center. ? ?FAILURE TO FOLLOW THESE INSTRUCTIONS MAY RESULT IN THE CANCELLATION OF YOUR SURGERY ?PATIENT SIGNATURE_________________________________ ? ?NURSE SIGNATURE__________________________________ ? ?________________________________________________________________________  ? ? ?           ?

## 2021-08-12 NOTE — H&P (View-Only) (Signed)
TOTAL HIP ADMISSION H&P ? ?Patient is admitted for right total hip arthroplasty. ? ?Subjective: ? ?Chief Complaint: right hip pain ? ?HPI: DIRK VANAMAN, 81 y.o. male, has a history of pain and functional disability in the right hip(s) due to arthritis and patient has failed non-surgical conservative treatments for greater than 12 weeks to include NSAID's and/or analgesics, flexibility and strengthening excercises, use of assistive devices, and activity modification.  Onset of symptoms was gradual starting 5 years ago with rapidlly worsening course since that time.The patient noted no past surgery on the right hip(s).  Patient currently rates pain in the right hip at 6 out of 10 with activity. Patient has night pain, worsening of pain with activity and weight bearing, pain that interfers with activities of daily living, and pain with passive range of motion. Patient has evidence of subchondral cysts, subchondral sclerosis, periarticular osteophytes, and joint space narrowing by imaging studies. This condition presents safety issues increasing the risk of falls. There is no current active infection. ? ?Patient Active Problem List  ? Diagnosis Date Noted  ? Allergic rhinitis 06/25/2020  ? Chronic kidney disease, stage 3 unspecified ([place]) 06/25/2020  ? Hardening of the aorta (main artery of the heart) ([place]) 06/25/2020  ? Hypertensive retinopathy 06/25/2020  ? Other specified abnormal findings of blood chemistry 06/25/2020  ? Type 2 diabetes mellitus with other specified complication ([place]) 76/54/6503  ? Sleep apnea   ? Insulin resistance   ? Hypertriglyceridemia   ? Hypertension   ? History of skin cancer   ? Diverticulosis   ? Diverticulitis   ? Cancer [facility])   ? Borderline diabetes   ? Arthritis   ? Left rotator cuff tear 09/14/2014  ? Rotator cuff tear 09/14/2014  ? Cough 08/20/2014  ? SUNCT (short unilateral neuralgiform headache, conjunctival inj/tear) 04/07/2013  ? Hyperglycemia 01/03/2013  ? Personal history  of colonic polyps 01/20/2011  ? Diverticulosis of colon 01/20/2011  ? Hyperlipidemia 01/02/2011  ? HTN (hypertension) 01/02/2011  ? ?Past Medical History:  ?Diagnosis Date  ? Allergy   ? Arthritis   ? Cancer [facility])   ? skin pre cancer nose  ? Cough May 2016  ? new x69mo denies fever and shortness of breath  ? Diabetes mellitus without complication ([place]   ? type 2   ? Diverticulitis   ? Erectile dysfunction   ? Hemorrhoids   ? History of skin cancer   ? basal cell removed left arm 09/07/14  ? Hx of colonic polyps   ? Hyperlipidemia   ? Hypertension   ? orthostatic  ? Hypertriglyceridemia   ? Rotator cuff tear   ? left  ? Sleep apnea   ? cpap setting at 4   ? SUNCT 05/14/12  ? SUNCT followed by Dr. WJannifer Franklin ? Vitamin D deficiency   ?  ?Past Surgical History:  ?Procedure Laterality Date  ? COLONOSCOPY W/ BIOPSIES AND POLYPECTOMY  multiple, 2005, 08, 14  ? KNEE SURGERY  1960  ? right / for [place] ? NASAL SINUS SURGERY    ? RECTAL BIOPSY    ? SHOULDER ARTHROSCOPY WITH ROTATOR CUFF REPAIR AND SUBACROMIAL DECOMPRESSION Right 12/14/2019  ? Procedure: Right shoulder mini open rotator cuff repair, subacromial decompression,;  Surgeon: BSusa Day MD;  [facility];  Service: Orthopedics;  Laterality: Right;  2 hrs ?Choice with Block  ? SHOULDER OPEN ROTATOR CUFF REPAIR Left 09/14/2014  ? Procedure: LEFT SHOULDER MINI OPEN ROTATOR CUFF REPAIR AND SUBACROMIAL DECOMPRESSION ;  Surgeon: Susa Day, MD;  Location: [facility];  Service: Orthopedics;  Laterality: Left;  ?  ?Current Outpatient Medications  ?Medication Sig Dispense Refill Last Dose  ? Ascorbic Acid (VITAMIN C) 1000 MG tablet Take 1,000 mg by mouth daily.     ? aspirin 81 MG tablet Take 81 mg by mouth every evening.     ? Berberine Chloride POWD Take 800 mg by mouth every morning. 2 caps every morning     ? fenofibrate (TRICOR) 145 MG tablet Take 1 tablet (145 mg total) by mouth daily. 90 tablet 3   ? glipiZIDE (GLUCOTROL XL) 10 MG 24 hr tablet Take 10  mg by mouth daily with breakfast.      ? Glucosamine-Chondroit-Vit C-Mn (GLUCOSAMINE 1500 COMPLEX) CAPS Take 2 tablets by mouth daily.     ? icosapent Ethyl (VASCEPA) 1 g capsule Take 2 capsules (2 g total) by mouth 2 (two) times daily. 360 capsule 3   ? lamoTRIgine (LAMICTAL) 25 MG tablet Take 3 tablets (75 mg total) by mouth 2 (two) times daily. 540 tablet 3   ? Lecithin 1200 MG CAPS Take 2,400 mg by mouth every morning.      ? metoprolol tartrate (LOPRESSOR) 25 MG tablet TAKE 1 TABLET TWICE A DAY 180 tablet 3   ? mometasone (NASONEX) 50 MCG/ACT nasal spray Place 2 sprays into the nose daily as needed (allergies).     ? OVER THE COUNTER MEDICATION Take 2 capsules by mouth every morning. [place]     ? Saw Palmetto, Serenoa repens, (SAW PALMETTO PO) Take 2 capsules by mouth in the morning and at bedtime.      ? topiramate (TOPAMAX) 50 MG tablet Take 1 tablet (50 mg total) by mouth 2 (two) times daily. 180 tablet 3   ? TURMERIC PO Take 2 capsules by mouth in the morning and at bedtime.     ? Ubiquinol 200 MG CAPS Take 400 mg by mouth in the morning and at bedtime.      ? ?No current facility-administered medications for this visit.  ? ?Allergies  ?Allergen Reactions  ? Lisinopril Cough  ?  ?Social History  ? ?Tobacco Use  ? Smoking status: Never  ? Smokeless tobacco: Never  ?Substance Use Topics  ? Alcohol use: Yes  ?  Comment: rarely  ?  ?Family History  ?Problem Relation Age of Onset  ? Colon cancer Neg Hx   ? Colon polyps Neg Hx   ? Esophageal cancer Neg Hx   ? Rectal cancer Neg Hx   ? Stomach cancer Neg Hx   ?  ? ?Review of Systems  ?Musculoskeletal:  Positive for arthralgias, gait problem and myalgias.  ?All other systems reviewed and are negative. ? ?Objective: ? ?Physical Exam ?Constitutional:   ?   Appearance: Normal appearance.  ?HENT:  ?   Head: Normocephalic and atraumatic.  ?   Nose: Nose normal.  ?   Mouth/Throat:  ?   Mouth: Mucous membranes are moist.  ?   Pharynx: Oropharynx is clear.  ?Eyes:  ?    Extraocular Movements: Extraocular movements intact.  ?Cardiovascular:  ?   Rate and Rhythm: Normal rate and regular rhythm.  ?   Pulses: Normal pulses.  ?   Heart sounds: Normal heart sounds.  ?Pulmonary:  ?   Effort: Pulmonary effort is normal.  ?   Breath sounds: Normal breath sounds.  ?Abdominal:  ?   General: Abdomen is flat.  ?   Palpations:  Abdomen is soft.  ?Genitourinary: ?   Comments: deferred ?Musculoskeletal:  ?   Cervical back: Normal range of motion and neck supple.  ?   Right hip: Tenderness present. Decreased range of motion.  ?   Left hip: Normal.  ?Neurological:  ?   Mental Status: He is alert.  ? ? ?Vital signs in last 24 hours: ?'@VSRANGES'$ @ ? ?Labs: ? ? ?Estimated body mass index is 28.87 kg/m? as calculated from the following: ?  Height as of 08/07/21: '5\' 11"'$  (1.803 m). ?  Weight as of 08/07/21: 93.9 kg. ? ? ?Imaging Review ?Plain radiographs demonstrate severe degenerative joint disease of the right hip(s). The bone quality appears to be adequate for age and reported activity level. ? ? ? ? ? ?Assessment/Plan: ? ?End stage arthritis, right hip(s) ? ?The patient history, physical examination, clinical judgement of the provider and imaging studies are consistent with end stage degenerative joint disease of the right hip(s) and total hip arthroplasty is deemed medically necessary. The treatment options including medical management, injection therapy, arthroscopy and arthroplasty were discussed at length. The risks and benefits of total hip arthroplasty were presented and reviewed. The risks due to aseptic loosening, infection, stiffness, dislocation/subluxation,  thromboembolic complications and other imponderables were discussed.  The patient acknowledged the explanation, agreed to proceed with the plan and consent was signed. Patient is being admitted for inpatient treatment for surgery, pain control, PT, OT, prophylactic antibiotics, VTE prophylaxis, progressive ambulation and ADL's and discharge  planning.The patient is planning to be discharged home with HEP. ? ?Therapy Plans: HEP. Discussed the importance of hip abductor strengthening with 150 standing side leg raises and non- pounding exer

## 2021-08-14 ENCOUNTER — Encounter (HOSPITAL_COMMUNITY)
Admission: RE | Admit: 2021-08-14 | Discharge: 2021-08-14 | Disposition: A | Discharge status: Home / Self Care | Payer: Medicare Other | Source: Ambulatory Visit | Attending: Orthopedic Surgery | Admitting: Orthopedic Surgery

## 2021-08-14 ENCOUNTER — Other Ambulatory Visit: Payer: Self-pay

## 2021-08-14 ENCOUNTER — Encounter (HOSPITAL_COMMUNITY): Payer: Self-pay

## 2021-08-14 VITALS — BP 137/80 | HR 71 | Temp 98.4°F | Resp 16 | Ht 71.5 in | Wt 200.0 lb

## 2021-08-14 DIAGNOSIS — G4730 Sleep apnea, unspecified: Secondary | ICD-10-CM | POA: Insufficient documentation

## 2021-08-14 DIAGNOSIS — M1611 Unilateral primary osteoarthritis, right hip: Secondary | ICD-10-CM | POA: Insufficient documentation

## 2021-08-14 DIAGNOSIS — Z794 Long term (current) use of insulin: Secondary | ICD-10-CM | POA: Insufficient documentation

## 2021-08-14 DIAGNOSIS — E1165 Type 2 diabetes mellitus with hyperglycemia: Secondary | ICD-10-CM

## 2021-08-14 DIAGNOSIS — E1169 Type 2 diabetes mellitus with other specified complication: Secondary | ICD-10-CM | POA: Diagnosis not present

## 2021-08-14 DIAGNOSIS — I10 Essential (primary) hypertension: Secondary | ICD-10-CM | POA: Insufficient documentation

## 2021-08-14 DIAGNOSIS — Z9989 Dependence on other enabling machines and devices: Secondary | ICD-10-CM | POA: Insufficient documentation

## 2021-08-14 DIAGNOSIS — Z01818 Encounter for other preprocedural examination: Secondary | ICD-10-CM | POA: Diagnosis not present

## 2021-08-14 LAB — CBC
HCT: 39.7 % (ref 39.0–52.0)
Hemoglobin: 13.3 g/dL (ref 13.0–17.0)
MCH: 33.8 pg (ref 26.0–34.0)
MCHC: 33.5 g/dL (ref 30.0–36.0)
MCV: 100.8 fL — ABNORMAL HIGH (ref 80.0–100.0)
Platelets: 252 10*3/uL (ref 150–400)
RBC: 3.94 MIL/uL — ABNORMAL LOW (ref 4.22–5.81)
RDW: 13.2 % (ref 11.5–15.5)
WBC: 4.8 10*3/uL (ref 4.0–10.5)
nRBC: 0 % (ref 0.0–0.2)

## 2021-08-14 LAB — BASIC METABOLIC PANEL
Anion gap: 7 (ref 5–15)
BUN: 35 mg/dL — ABNORMAL HIGH (ref 8–23)
CO2: 24 mmol/L (ref 22–32)
Calcium: 9.4 mg/dL (ref 8.9–10.3)
Chloride: 108 mmol/L (ref 98–111)
Creatinine, Ser: 1.54 mg/dL — ABNORMAL HIGH (ref 0.61–1.24)
GFR, Estimated: 46 mL/min — ABNORMAL LOW (ref >60)
Glucose, Bld: 110 mg/dL — ABNORMAL HIGH (ref 70–99)
Potassium: 4.4 mmol/L (ref 3.5–5.1)
Sodium: 139 mmol/L (ref 135–145)

## 2021-08-14 LAB — SURGICAL PCR SCREEN
MRSA, PCR: NEGATIVE
Staphylococcus aureus: NEGATIVE

## 2021-08-14 LAB — GLUCOSE, CAPILLARY: Glucose-Capillary: 106 mg/dL — ABNORMAL HIGH (ref 70–99)

## 2021-08-14 LAB — HEMOGLOBIN A1C
Hgb A1c MFr Bld: 6 % — ABNORMAL HIGH (ref 4.8–5.6)
Mean Plasma Glucose: 125.5 mg/dL

## 2021-08-14 NOTE — Progress Notes (Signed)
Anesthesia Chart Review ? ? Case: [id number] Date/Time: 08/22/21 0715  ? Procedure: TOTAL HIP ARTHROPLASTY ANTERIOR APPROACH (Right: Hip) - 150  ? Anesthesia type: Spinal  ? Pre-op diagnosis: Right hip osteoarthritis  ? [facility] 08 [facility]  ? Surgeons: Rod Can, MD  ? ?  ? ? ?DISCUSSION:81 y.o. never smoker with h/o HTN, sleep apnea, DM II, right hip OA scheduled for above procedure 08/22/2021 with Dr. Rod Can.  ? ?Per cardiology preoperative evaluation 07/12/2021, "Chart reviewed as part of pre-operative protocol coverage. Given past medical history and time since last visit, based on ACC/AHA guidelines, MAXAMILIAN AMADON would be at acceptable risk for the planned procedure without further cardiovascular testing.  ?  ?Patient was advised that if he develops new symptoms prior to surgery to contact our office to arrange a follow-up appointment.  He verbalized understanding. ?  ?Patient's aspirin is not prescribed by cardiology.  He will need to receive recommendations on holding aspirin by prescribing provider. ?  ?His RCRI is a class I risk, 0.4% risk of major cardiac event.  He is able to complete greater than 4 METS of physical activity." ? ?Anticipate pt can proceed with planned procedure barring acute status change.   ?VS: BP 137/80   Pulse 71   Temp 36.9 ?C (Oral)   Resp 16   Ht 5' 11.5" (1.816 m)   Wt 90.7 kg   SpO2 (!) 16%   BMI 27.51 kg/m?  ? ?PROVIDERS: ?Juanell Fairly, MD (Inactive)  ? ?Primary Cardiologist:  Mertie Moores, MD ?LABS: Labs reviewed: Acceptable for surgery. ?(all labs ordered are listed, but only abnormal results are displayed) ? ?Labs Reviewed  ?HEMOGLOBIN A1C - Abnormal; Notable for the following components:  ?    Result Value  ? Hgb A1c MFr Bld 6.0 (*)   ? All other components within normal limits  ?BASIC METABOLIC PANEL - Abnormal; Notable for the following components:  ? Glucose, Bld 110 (*)   ? BUN 35 (*)   ? Creatinine, Ser 1.54 (*)   ? GFR, Estimated 46  (*)   ? All other components within normal limits  ?CBC - Abnormal; Notable for the following components:  ? RBC 3.94 (*)   ? MCV 100.8 (*)   ? All other components within normal limits  ?GLUCOSE, CAPILLARY - Abnormal; Notable for the following components:  ? Glucose-Capillary 106 (*)   ? All other components within normal limits  ?SURGICAL PCR SCREEN  ?TYPE AND SCREEN  ? ? ? ?IMAGES: ? ? ?EKG: ?08/14/2021 ?Rate 67 bpm  ?NSR ? ?CV: ? ?Past Medical History:  ?Diagnosis Date  ? Allergy   ? Arthritis   ? Cancer [facility])   ? skin pre cancer nose  ? Cough May 2016  ? new x53mo denies fever and shortness of breath  ? Diabetes [place]   ? type 2   ? Diverticulitis   ? Erectile dysfunction   ? Hemorrhoids   ? History of skin cancer   ? basal cell removed left arm 09/07/14  ? Hx of colonic polyps   ? Hyperlipidemia   ? Hypertension   ? orthostatic  ? Hypertriglyceridemia   ? Rotator cuff tear   ? left  ? Sleep apnea   ? cpap setting at 4   ? SUNCT 05/14/12  ? SUNCT followed by Dr. WJannifer Franklin ? Vitamin D deficiency   ? ? ?Past Surgical History:  ?Procedure Laterality Date  ?  COLONOSCOPY W/ BIOPSIES AND POLYPECTOMY  multiple, 2005, 08, 14  ? KNEE SURGERY  1960  ? right / for [place]  ? NASAL SINUS SURGERY    ? RECTAL BIOPSY    ? SHOULDER ARTHROSCOPY WITH ROTATOR CUFF REPAIR AND SUBACROMIAL DECOMPRESSION Right 12/14/2019  ? Procedure: Right shoulder mini open rotator cuff repair, subacromial decompression,;  Surgeon: Susa Day, MD;  [facility];  Service: Orthopedics;  Laterality: Right;  2 hrs ?Choice with Block  ? SHOULDER OPEN ROTATOR CUFF REPAIR Left 09/14/2014  ? Procedure: LEFT SHOULDER MINI OPEN ROTATOR CUFF REPAIR AND SUBACROMIAL DECOMPRESSION ;  Surgeon: Susa Day, MD;  [facility];  Service: Orthopedics;  Laterality: Left;  ? ? ?MEDICATIONS: ? Ascorbic Acid (VITAMIN C) 1000 MG tablet  ? aspirin 81 MG tablet  ? Berberine Chloride POWD  ? fenofibrate (TRICOR) 145 MG tablet   ? glipiZIDE (GLUCOTROL XL) 10 MG 24 hr tablet  ? Glucosamine-Chondroit-Vit C-Mn (GLUCOSAMINE 1500 COMPLEX) CAPS  ? icosapent Ethyl (VASCEPA) 1 g capsule  ? lamoTRIgine (LAMICTAL) 25 MG tablet  ? Lecithin 1200 MG CAPS  ? metoprolol tartrate (LOPRESSOR) 25 MG tablet  ? mometasone (NASONEX) 50 MCG/ACT nasal spray  ? OVER THE COUNTER MEDICATION  ? Saw Palmetto, Serenoa repens, (SAW PALMETTO PO)  ? topiramate (TOPAMAX) 50 MG tablet  ? TURMERIC PO  ? Ubiquinol 200 MG CAPS  ? ?No current facility-administered medications for this encounter.  ? ? ? ?Konrad Felix Ward, PA-C ?[facility] ?[phone] ? ? ? ? ? ?

## 2021-08-22 ENCOUNTER — Encounter (HOSPITAL_COMMUNITY): Payer: Self-pay | Admitting: Orthopedic Surgery

## 2021-08-22 ENCOUNTER — Other Ambulatory Visit: Payer: Self-pay

## 2021-08-22 ENCOUNTER — Ambulatory Visit (HOSPITAL_COMMUNITY)
Admission: RE | Admit: 2021-08-22 | Discharge: 2021-08-22 | Disposition: A | Discharge status: Home / Self Care | Payer: Medicare Other | Source: Ambulatory Visit | Attending: Orthopedic Surgery | Admitting: Orthopedic Surgery

## 2021-08-22 ENCOUNTER — Ambulatory Visit (HOSPITAL_COMMUNITY): Payer: Medicare Other

## 2021-08-22 ENCOUNTER — Encounter (HOSPITAL_COMMUNITY)
Admission: RE | Disposition: A | Discharge status: Home / Self Care | Payer: Self-pay | Source: Ambulatory Visit | Attending: Orthopedic Surgery

## 2021-08-22 ENCOUNTER — Ambulatory Visit (HOSPITAL_BASED_OUTPATIENT_CLINIC_OR_DEPARTMENT_OTHER): Payer: Medicare Other | Admitting: Certified Registered"

## 2021-08-22 ENCOUNTER — Ambulatory Visit (HOSPITAL_COMMUNITY): Payer: Medicare Other | Admitting: Physician Assistant

## 2021-08-22 DIAGNOSIS — M1611 Unilateral primary osteoarthritis, right hip: Secondary | ICD-10-CM | POA: Diagnosis not present

## 2021-08-22 DIAGNOSIS — I10 Essential (primary) hypertension: Secondary | ICD-10-CM

## 2021-08-22 DIAGNOSIS — Z7984 Long term (current) use of oral hypoglycemic drugs: Secondary | ICD-10-CM

## 2021-08-22 DIAGNOSIS — N183 Chronic kidney disease, stage 3 unspecified: Secondary | ICD-10-CM | POA: Insufficient documentation

## 2021-08-22 DIAGNOSIS — Z471 Aftercare following joint replacement surgery: Secondary | ICD-10-CM | POA: Diagnosis not present

## 2021-08-22 DIAGNOSIS — M6281 Muscle weakness (generalized): Secondary | ICD-10-CM | POA: Insufficient documentation

## 2021-08-22 DIAGNOSIS — I130 Hypertensive heart and chronic kidney disease with heart failure and stage 1 through stage 4 chronic kidney disease, or unspecified chronic kidney disease: Secondary | ICD-10-CM | POA: Insufficient documentation

## 2021-08-22 DIAGNOSIS — E1169 Type 2 diabetes mellitus with other specified complication: Secondary | ICD-10-CM

## 2021-08-22 DIAGNOSIS — E119 Type 2 diabetes mellitus without complications: Secondary | ICD-10-CM | POA: Diagnosis not present

## 2021-08-22 DIAGNOSIS — R262 Difficulty in walking, not elsewhere classified: Secondary | ICD-10-CM | POA: Insufficient documentation

## 2021-08-22 DIAGNOSIS — I509 Heart failure, unspecified: Secondary | ICD-10-CM | POA: Insufficient documentation

## 2021-08-22 DIAGNOSIS — Z96641 Presence of right artificial hip joint: Secondary | ICD-10-CM | POA: Diagnosis not present

## 2021-08-22 DIAGNOSIS — E1122 Type 2 diabetes mellitus with diabetic chronic kidney disease: Secondary | ICD-10-CM | POA: Diagnosis not present

## 2021-08-22 HISTORY — PX: TOTAL HIP ARTHROPLASTY: SHX124

## 2021-08-22 LAB — TYPE AND SCREEN
ABO/RH(D): O POS
Antibody Screen: NEGATIVE

## 2021-08-22 LAB — ABO/RH: ABO/RH(D): O POS

## 2021-08-22 LAB — GLUCOSE, CAPILLARY
Glucose-Capillary: 136 mg/dL — ABNORMAL HIGH (ref 70–99)
Glucose-Capillary: 143 mg/dL — ABNORMAL HIGH (ref 70–99)

## 2021-08-22 SURGERY — ARTHROPLASTY, HIP, TOTAL, ANTERIOR APPROACH
Anesthesia: Monitor Anesthesia Care | Site: Hip | Laterality: Right

## 2021-08-22 MED ORDER — PROPOFOL 1000 MG/100ML IV EMUL
INTRAVENOUS | Status: AC
  Filled 2021-08-22: qty 100

## 2021-08-22 MED ORDER — LACTATED RINGERS IV SOLN: INTRAVENOUS | Status: DC

## 2021-08-22 MED ORDER — ASPIRIN 81 MG PO CHEW: 81.0000 mg | CHEWABLE_TABLET | Freq: Two times a day (BID) | ORAL | 0 refills | Status: AC

## 2021-08-22 MED ORDER — ISOPROPYL ALCOHOL 70 % SOLN
Status: DC | PRN
  Administered 2021-08-22: 1 via TOPICAL

## 2021-08-22 MED ORDER — METHOCARBAMOL 500 MG IVPB - SIMPLE MED: 500.0000 mg | Freq: Four times a day (QID) | INTRAVENOUS | Status: DC | PRN

## 2021-08-22 MED ORDER — METHOCARBAMOL 500 MG PO TABS: 500.0000 mg | ORAL_TABLET | Freq: Four times a day (QID) | ORAL | Status: DC | PRN

## 2021-08-22 MED ORDER — TRANEXAMIC ACID-NACL 1000-0.7 MG/100ML-% IV SOLN
1000.0000 mg | INTRAVENOUS | Status: AC
  Administered 2021-08-22: 1000 mg via INTRAVENOUS
  Filled 2021-08-22: qty 100

## 2021-08-22 MED ORDER — CEFAZOLIN SODIUM-DEXTROSE 2-4 GM/100ML-% IV SOLN
INTRAVENOUS | Status: AC
  Filled 2021-08-22: qty 100

## 2021-08-22 MED ORDER — SODIUM CHLORIDE (PF) 0.9 % IJ SOLN
INTRAMUSCULAR | Status: AC
  Filled 2021-08-22: qty 50

## 2021-08-22 MED ORDER — HYDROCODONE-ACETAMINOPHEN 10-325 MG PO TABS: 0.5000 | ORAL_TABLET | ORAL | 0 refills | Status: DC | PRN

## 2021-08-22 MED ORDER — PROPOFOL 10 MG/ML IV BOLUS
INTRAVENOUS | Status: AC
  Filled 2021-08-22: qty 20

## 2021-08-22 MED ORDER — OXYCODONE HCL 5 MG/5ML PO SOLN: 5.0000 mg | Freq: Once | ORAL | Status: AC | PRN

## 2021-08-22 MED ORDER — SENNA 8.6 MG PO TABS: 2.0000 | ORAL_TABLET | Freq: Every day | ORAL | 1 refills | Status: DC

## 2021-08-22 MED ORDER — AMISULPRIDE (ANTIEMETIC) 5 MG/2ML IV SOLN: 10.0000 mg | Freq: Once | INTRAVENOUS | Status: DC | PRN

## 2021-08-22 MED ORDER — CEFAZOLIN SODIUM-DEXTROSE 2-4 GM/100ML-% IV SOLN
2.0000 g | Freq: Four times a day (QID) | INTRAVENOUS | Status: DC
  Administered 2021-08-22: 2 g via INTRAVENOUS

## 2021-08-22 MED ORDER — OXYCODONE HCL 5 MG PO TABS
ORAL_TABLET | ORAL | Status: AC
  Filled 2021-08-22: qty 1

## 2021-08-22 MED ORDER — PHENYLEPHRINE HCL (PRESSORS) 10 MG/ML IV SOLN
INTRAVENOUS | Status: AC
  Filled 2021-08-22: qty 1

## 2021-08-22 MED ORDER — KETOROLAC TROMETHAMINE 30 MG/ML IJ SOLN
INTRAMUSCULAR | Status: AC
  Filled 2021-08-22: qty 1

## 2021-08-22 MED ORDER — POVIDONE-IODINE 10 % EX SWAB: 2.0000 "application " | Freq: Once | CUTANEOUS | Status: DC

## 2021-08-22 MED ORDER — SODIUM CHLORIDE (PF) 0.9 % IJ SOLN
INTRAMUSCULAR | Status: DC | PRN
  Administered 2021-08-22: 30 mL via INTRAVENOUS

## 2021-08-22 MED ORDER — ACETAMINOPHEN 500 MG PO TABS
1000.0000 mg | ORAL_TABLET | Freq: Once | ORAL | Status: AC
  Administered 2021-08-22: 1000 mg via ORAL
  Filled 2021-08-22: qty 2

## 2021-08-22 MED ORDER — DEXAMETHASONE SODIUM PHOSPHATE 10 MG/ML IJ SOLN
INTRAMUSCULAR | Status: DC | PRN
  Administered 2021-08-22: 5 mg via INTRAVENOUS

## 2021-08-22 MED ORDER — ONDANSETRON HCL 4 MG PO TABS: 4.0000 mg | ORAL_TABLET | Freq: Three times a day (TID) | ORAL | 0 refills | Status: DC | PRN

## 2021-08-22 MED ORDER — CEFAZOLIN SODIUM-DEXTROSE 2-4 GM/100ML-% IV SOLN
2.0000 g | INTRAVENOUS | Status: AC
  Administered 2021-08-22: 2 g via INTRAVENOUS
  Filled 2021-08-22: qty 100

## 2021-08-22 MED ORDER — FENTANYL CITRATE PF 50 MCG/ML IJ SOSY: 25.0000 ug | PREFILLED_SYRINGE | INTRAMUSCULAR | Status: DC | PRN

## 2021-08-22 MED ORDER — LIDOCAINE HCL (PF) 2 % IJ SOLN
INTRAMUSCULAR | Status: AC
  Filled 2021-08-22: qty 5

## 2021-08-22 MED ORDER — OXYCODONE HCL 5 MG PO TABS
5.0000 mg | ORAL_TABLET | Freq: Once | ORAL | Status: AC | PRN
  Administered 2021-08-22: 5 mg via ORAL

## 2021-08-22 MED ORDER — BUPIVACAINE-EPINEPHRINE 0.25% -1:200000 IJ SOLN
INTRAMUSCULAR | Status: DC | PRN
Start: 2021-08-22 — End: 2021-08-22
  Administered 2021-08-22: 30 mL

## 2021-08-22 MED ORDER — LACTATED RINGERS IV BOLUS
250.0000 mL | Freq: Once | INTRAVENOUS | Status: AC
  Administered 2021-08-22: 250 mL via INTRAVENOUS

## 2021-08-22 MED ORDER — WATER FOR IRRIGATION, STERILE IR SOLN
Status: DC | PRN
  Administered 2021-08-22: 2000 mL

## 2021-08-22 MED ORDER — FENTANYL CITRATE (PF) 100 MCG/2ML IJ SOLN
INTRAMUSCULAR | Status: DC | PRN
  Administered 2021-08-22: 50 ug via INTRAVENOUS

## 2021-08-22 MED ORDER — ONDANSETRON HCL 4 MG/2ML IJ SOLN: 4.0000 mg | Freq: Once | INTRAMUSCULAR | Status: DC | PRN

## 2021-08-22 MED ORDER — KETOROLAC TROMETHAMINE 30 MG/ML IJ SOLN
INTRAMUSCULAR | Status: DC | PRN
  Administered 2021-08-22: 30 mg via INTRAVENOUS

## 2021-08-22 MED ORDER — FENTANYL CITRATE (PF) 100 MCG/2ML IJ SOLN
INTRAMUSCULAR | Status: AC
  Filled 2021-08-22: qty 2

## 2021-08-22 MED ORDER — POVIDONE-IODINE 10 % EX SWAB
2.0000 "application " | Freq: Once | CUTANEOUS | Status: AC
  Administered 2021-08-22: 2 via TOPICAL

## 2021-08-22 MED ORDER — ORAL CARE MOUTH RINSE: 15.0000 mL | Freq: Once | OROMUCOSAL | Status: AC

## 2021-08-22 MED ORDER — LACTATED RINGERS IV BOLUS
500.0000 mL | Freq: Once | INTRAVENOUS | Status: AC
  Administered 2021-08-22: 500 mL via INTRAVENOUS

## 2021-08-22 MED ORDER — POLYETHYLENE GLYCOL 3350 17 G PO PACK: 17.0000 g | PACK | Freq: Every day | ORAL | 0 refills | Status: DC | PRN

## 2021-08-22 MED ORDER — PROPOFOL 10 MG/ML IV BOLUS
INTRAVENOUS | Status: DC | PRN
  Administered 2021-08-22: 10 mg via INTRAVENOUS
  Administered 2021-08-22: 20 mg via INTRAVENOUS
  Administered 2021-08-22 (×2): 10 mg via INTRAVENOUS

## 2021-08-22 MED ORDER — BUPIVACAINE IN DEXTROSE 0.75-8.25 % IT SOLN
INTRATHECAL | Status: DC | PRN
  Administered 2021-08-22: 2 mL via INTRATHECAL

## 2021-08-22 MED ORDER — PROPOFOL 500 MG/50ML IV EMUL
INTRAVENOUS | Status: DC | PRN
  Administered 2021-08-22: 75 ug/kg/min via INTRAVENOUS

## 2021-08-22 MED ORDER — CHLORHEXIDINE GLUCONATE 0.12 % MT SOLN
15.0000 mL | Freq: Once | OROMUCOSAL | Status: AC
  Administered 2021-08-22: 15 mL via OROMUCOSAL

## 2021-08-22 MED ORDER — BUPIVACAINE-EPINEPHRINE (PF) 0.25% -1:200000 IJ SOLN
INTRAMUSCULAR | Status: AC
  Filled 2021-08-22: qty 30

## 2021-08-22 MED ORDER — DOCUSATE SODIUM 100 MG PO CAPS: 100.0000 mg | ORAL_CAPSULE | Freq: Two times a day (BID) | ORAL | 1 refills | Status: DC

## 2021-08-22 MED ORDER — EPHEDRINE SULFATE-NACL 50-0.9 MG/10ML-% IV SOSY
PREFILLED_SYRINGE | INTRAVENOUS | Status: DC | PRN
  Administered 2021-08-22 (×5): 5 mg via INTRAVENOUS

## 2021-08-22 MED ORDER — PHENYLEPHRINE HCL-NACL 20-0.9 MG/250ML-% IV SOLN
INTRAVENOUS | Status: DC | PRN
  Administered 2021-08-22: 30 ug/min via INTRAVENOUS

## 2021-08-22 MED ORDER — SODIUM CHLORIDE 0.9 % IR SOLN
Status: DC | PRN
  Administered 2021-08-22: 1000 mL

## 2021-08-22 SURGICAL SUPPLY — 65 items
ADH SKN CLS APL DERMABOND .7 (GAUZE/BANDAGES/DRESSINGS) ×1
APL PRP STRL LF DISP 70% ISPRP (MISCELLANEOUS) ×1
BAG COUNTER SPONGE SURGICOUNT (BAG) IMPLANT
BAG DECANTER FOR FLEXI CONT (MISCELLANEOUS) IMPLANT
BAG SPEC THK2 15X12 ZIP CLS (MISCELLANEOUS)
BAG SPNG CNTER NS LX DISP (BAG)
BAG ZIPLOCK 12X15 (MISCELLANEOUS) IMPLANT
CHLORAPREP W/TINT 26 (MISCELLANEOUS) ×3 IMPLANT
COVER PERINEAL POST (MISCELLANEOUS) ×3 IMPLANT
COVER SURGICAL LIGHT HANDLE (MISCELLANEOUS) ×3 IMPLANT
DERMABOND ADVANCED (GAUZE/BANDAGES/DRESSINGS) ×1
DERMABOND ADVANCED .7 DNX12 (GAUZE/BANDAGES/DRESSINGS) ×4 IMPLANT
DRAPE IMP U-DRAPE 54X76 (DRAPES) ×3 IMPLANT
DRAPE SHEET LG 3/4 BI-LAMINATE (DRAPES) ×9 IMPLANT
DRAPE STERI IOBAN 125X83 (DRAPES) ×3 IMPLANT
DRAPE U-SHAPE 47X51 STRL (DRAPES) ×6 IMPLANT
DRSG AQUACEL AG ADV 3.5X10 (GAUZE/BANDAGES/DRESSINGS) ×3 IMPLANT
ELECT REM PT RETURN 15FT ADLT (MISCELLANEOUS) ×3 IMPLANT
GAUZE SPONGE 4X4 12PLY STRL (GAUZE/BANDAGES/DRESSINGS) ×3 IMPLANT
GLOVE BIO SURGEON STRL SZ8.5 (GLOVE) ×6 IMPLANT
GLOVE BIOGEL M 7.0 STRL (GLOVE) ×3 IMPLANT
GLOVE BIOGEL PI IND STRL 7.5 (GLOVE) ×2 IMPLANT
GLOVE BIOGEL PI IND STRL 8 (GLOVE) ×2 IMPLANT
GLOVE BIOGEL PI IND STRL 8.5 (GLOVE) ×2 IMPLANT
GLOVE BIOGEL PI INDICATOR 7.5 (GLOVE) ×1
GLOVE BIOGEL PI INDICATOR 8 (GLOVE) ×1
GLOVE BIOGEL PI INDICATOR 8.5 (GLOVE) ×1
GLOVE SURG LX 7.5 STRW (GLOVE) ×2
GLOVE SURG LX STRL 7.5 STRW (GLOVE) ×4 IMPLANT
GOWN SPEC L3 XXLG W/TWL (GOWN DISPOSABLE) ×6 IMPLANT
HANDPIECE INTERPULSE COAX TIP (DISPOSABLE) ×2
HEAD FEM -3XOFST 36XMDLR (Head) IMPLANT
HEAD MODULAR 36MM (Head) ×2 IMPLANT
HOLDER FOLEY CATH W/STRAP (MISCELLANEOUS) ×3 IMPLANT
HOOD PEEL AWAY FLYTE STAYCOOL (MISCELLANEOUS) ×9 IMPLANT
KIT TURNOVER KIT A (KITS) IMPLANT
LINER ACETAB VIT E +5 36 F (Liner) ×1 IMPLANT
MANIFOLD NEPTUNE II (INSTRUMENTS) ×3 IMPLANT
MARKER SKIN DUAL TIP RULER LAB (MISCELLANEOUS) ×3 IMPLANT
NDL SAFETY ECLIPSE 18X1.5 (NEEDLE) ×2 IMPLANT
NDL SPNL 18GX3.5 QUINCKE PK (NEEDLE) ×2 IMPLANT
NEEDLE HYPO 18GX1.5 SHARP (NEEDLE) ×2
NEEDLE SPNL 18GX3.5 QUINCKE PK (NEEDLE) ×2 IMPLANT
PACK ANTERIOR HIP CUSTOM (KITS) ×3 IMPLANT
PENCIL SMOKE EVACUATOR (MISCELLANEOUS) IMPLANT
SAW OSC TIP CART 19.5X105X1.3 (SAW) ×3 IMPLANT
SEALER BIPOLAR AQUA 6.0 (INSTRUMENTS) ×3 IMPLANT
SET HNDPC FAN SPRY TIP SCT (DISPOSABLE) ×2 IMPLANT
SHELL ACET G7 4H 56 SZF (Shell) ×1 IMPLANT
SOLUTION PRONTOSAN WOUND 350ML (IRRIGATION / IRRIGATOR) ×3 IMPLANT
SPIKE FLUID TRANSFER (MISCELLANEOUS) ×3 IMPLANT
SPONGE T-LAP 18X18 ~~LOC~~+RFID (SPONGE) ×9 IMPLANT
STAPLER INSORB 30 2030 C-SECTI (MISCELLANEOUS) IMPLANT
STEM FEM CMTLS 15X115 133D (Stem) ×1 IMPLANT
SUT MNCRL AB 3-0 PS2 18 (SUTURE) ×3 IMPLANT
SUT MON AB 2-0 CT1 36 (SUTURE) ×3 IMPLANT
SUT STRATAFIX PDO 1 14 VIOLET (SUTURE) ×2
SUT STRATFX PDO 1 14 VIOLET (SUTURE) ×1
SUT VIC AB 2-0 CT1 27 (SUTURE)
SUT VIC AB 2-0 CT1 TAPERPNT 27 (SUTURE) IMPLANT
SUTURE STRATFX PDO 1 14 VIOLET (SUTURE) ×2 IMPLANT
SYR 3ML LL SCALE MARK (SYRINGE) ×3 IMPLANT
TRAY FOLEY MTR SLVR 16FR STAT (SET/KITS/TRAYS/PACK) IMPLANT
TUBE SUCTION HIGH CAP CLEAR NV (SUCTIONS) ×3 IMPLANT
WATER STERILE IRR 1000ML POUR (IV SOLUTION) ×3 IMPLANT

## 2021-08-22 NOTE — Anesthesia Postprocedure Evaluation (Signed)
Anesthesia Post Note ? ?Patient: Christian Ross ? ?Procedure(s) Performed: TOTAL HIP ARTHROPLASTY ANTERIOR APPROACH (Right: Hip) ? ?  ? ?Patient location during evaluation: PACU ?Anesthesia Type: MAC and Spinal ?Level of consciousness: awake and alert, oriented and patient cooperative ?Pain management: pain level controlled ?Vital Signs Assessment: post-procedure vital signs reviewed and stable ?Respiratory status: spontaneous breathing, nonlabored ventilation and respiratory function stable ?Cardiovascular status: blood pressure returned to baseline and stable ?Postop Assessment: no apparent nausea or vomiting, no headache, no backache, spinal receding and patient able to bend at knees ?Anesthetic complications: no ? ? ?No notable events documented. ? ?Last Vitals:  ?Vitals:  ? 08/22/21 1045 08/22/21 1055  ?BP: 132/72 (!) 144/88  ?Pulse: 60 66  ?Resp: 12 16  ?Temp: (!) 36.3 ?C   ?SpO2: 94% 94%  ?  ?Last Pain:  ?Vitals:  ? 08/22/21 1045  ?TempSrc:   ?PainSc: Asleep  ? ? ?  ?  ?  ?  ?  ?  ? ?Jarome Matin Cezar Misiaszek ? ? ? ? ?

## 2021-08-22 NOTE — Op Note (Signed)
OPERATIVE REPORT ? ?SURGEON: Rod Can, MD  ? ?ASSISTANT: Larene Pickett, PA-C. ? ?PREOPERATIVE DIAGNOSIS: Right hip arthritis.  ? ?POSTOPERATIVE DIAGNOSIS: Right hip arthritis.  ? ?PROCEDURE: Right total hip arthroplasty, anterior approach.  ? ?IMPLANTS: Biomet Taperloc Complete Microplasty stem, size 15 x 115 mm, high offset. ?Biomet G7 OsseoTi Cup, size 56 mm. ?Biomet Vivacit-E liner, size 36 mm, F, neutral. ?Biomet metal head ball, size 36 + 0 mm. ? ?ANESTHESIA:  MAC and Spinal ? ?ESTIMATED BLOOD LOSS:-300 mL   ? ?ANTIBIOTICS: 2g Ancef. ? ?DRAINS: None. ? ?COMPLICATIONS: None. ?  ?CONDITION: PACU - hemodynamically stable.  ? ?BRIEF CLINICAL NOTE: Christian Ross is a 81 y.o. male with a long-standing history of Right hip arthritis. After failing conservative management, the patient was indicated for total hip arthroplasty. The risks, benefits, and alternatives to the procedure were explained, and the patient elected to proceed. ? ?PROCEDURE IN DETAIL: Surgical site was marked by myself in the pre-op holding area. Once inside the operating room, spinal anesthesia was obtained, and a foley catheter was inserted. The patient was then positioned on the [place] table.  All bony prominences were well padded.  The hip was prepped and draped in the normal sterile surgical fashion.  A time-out was called verifying side and site of surgery. The patient received IV antibiotics within 60 minutes of beginning the procedure. ?  ?Bikini incision was made, and superficial dissection was performed lateral to the ASIS. The direct anterior approach to the hip was performed through the Hueter interval.  Lateral femoral circumflex vessels were treated with the Auqumantys. The anterior capsule was exposed and an inverted T capsulotomy was made. The femoral neck cut was made to the level of the templated cut.  A corkscrew was placed into the head and the head was removed.  The femoral head was found to have eburnated bone.  The head was passed to the back table and was measured. Pubofemoral ligament was released off of the calcar, taking care to stay on bone. Superior capsule was released from the greater trochanter, taking care to stay lateral to the posterior border of the femoral neck in order to preserve the short external rotators. ?  ?Acetabular exposure was achieved, and the pulvinar and labrum were excised. Sequential reaming of the acetabulum was then performed up to a size 55 mm reamer. A 56 mm cup was then opened and impacted into place at approximately 40 degrees of abduction and 20 degrees of anteversion. The final polyethylene liner was impacted into place and acetabular osteophytes were removed.  ?  ?I then gained femoral exposure taking care to protect the abductors and greater trochanter.  This was performed using standard external rotation, extension, and adduction.  A cookie cutter was used to enter the femoral canal, and then the femoral canal finder was placed.  Sequential broaching was performed up to a size 15.  Calcar planer was used on the femoral neck remnant.  I placed a high offset neck and a trial head ball.  The hip was reduced.  Leg lengths and offset were checked fluoroscopically.  The hip was dislocated and trial components were removed.  The final implants were placed, and the hip was reduced.  Fluoroscopy was used to confirm component position and leg lengths.  At 90 degrees of external rotation and full extension, the hip was stable to an anterior directed force. ?  ?The wound was copiously irrigated with Prontosan solution and normal saline using pule lavage.  Marcaine  solution was injected into the periarticular soft tissue.  The wound was closed in layers using #1 Stratafix for the fascia, 2-0 Vicryl for the subcutaneous fat, 2-0 Monocryl for the deep dermal layer, 3-0 running Monocryl subcuticular stitch, and Dermabond for the skin.  Once the glue was fully dried, an Aquacell Ag dressing was  applied.  The patient was transported to the recovery room in stable condition.  Sponge, needle, and instrument counts were correct at the end of the case x2.  The patient tolerated the procedure well and there were no known complications. ? ?Please note that a surgical assistant was a medical necessity for this procedure to perform it in a safe and expeditious manner. Assistant was necessary to provide appropriate retraction of vital neurovascular structures, to prevent femoral fracture, and to allow for anatomic placement of the prosthesis. ?

## 2021-08-22 NOTE — Transfer of Care (Signed)
Immediate Anesthesia Transfer of Care Note ? ?Patient: Christian Ross ? ?Procedure(s) Performed: TOTAL HIP ARTHROPLASTY ANTERIOR APPROACH (Right: Hip) ? ?Patient Location: PACU ? ?Anesthesia Type:MAC and Spinal ? ?Level of Consciousness: awake, alert  and oriented ? ?Airway & Oxygen Therapy: Patient Spontanous Breathing ? ?Post-op Assessment: Report given to RN and Post -op Vital signs reviewed and stable ? ?Post vital signs: Reviewed and stable ? ?Last Vitals:  ?Vitals Value Taken Time  ?BP 124/70 08/22/21 1001  ?Temp 36.3 ?C 08/22/21 1000  ?Pulse 59 08/22/21 1011  ?Resp 21 08/22/21 1011  ?SpO2 93 % 08/22/21 1011  ?Vitals shown include unvalidated device data. ? ?Last Pain:  ?Vitals:  ? 08/22/21 1000  ?TempSrc:   ?PainSc: 1   ?   ? ?Patients Stated Pain Goal: 4 (08/22/21 0615) ? ?Complications: No notable events documented. ?

## 2021-08-22 NOTE — Discharge Instructions (Addendum)
? ?Dr. Brian Swinteck ?Joint Replacement Specialist ?[facility] ?[address] ?[place], [place] [address] ?[phone] ? ? ?TOTAL HIP REPLACEMENT POSTOPERATIVE DIRECTIONS ? ? ? ?Hip Rehabilitation, Guidelines Following Surgery  ? ?WEIGHT BEARING ?Weight bearing as tolerated with assist device (walker, cane, etc) as directed, use it as long as suggested by your surgeon or therapist, typically at least 4-6 weeks. ? ?The results of a hip operation are greatly improved after range of motion and muscle strengthening exercises. Follow all safety measures which are given to protect your hip. If any of these exercises cause increased pain or swelling in your joint, decrease the amount until you are comfortable again. Then slowly increase the exercises. Call your caregiver if you have problems or questions.  ? ?HOME CARE INSTRUCTIONS  ?Most of the following instructions are designed to prevent the dislocation of your new hip.  ?Remove items at home which could result in a fall. This includes throw rugs or furniture in walking pathways.  ?Continue medications as instructed at time of discharge. ?You may have some home medications which will be placed on hold until you complete the course of blood thinner medication. ?You may start showering once you are discharged home. Do not remove your dressing. ?Do not put on socks or shoes without following the instructions of your caregivers.   ?Sit on chairs with arms. Use the chair arms to help push yourself up when arising.  ?Arrange for the use of a toilet seat elevator so you are not sitting low.  ?Walk with walker as instructed.  ?You may resume a sexual relationship in one month or when given the OK by your caregiver.  ?Use walker as long as suggested by your caregivers.  ?You may put full weight on your legs and walk as much as is comfortable. ?Avoid periods of inactivity such as sitting longer than an hour when not asleep. This helps prevent blood  clots.  ?You may return to work once you are cleared by your surgeon.  ?Do not drive a car for 6 weeks or until released by your surgeon.  ?Do not drive while taking narcotics.  ?Wear elastic stockings for two weeks following surgery during the day but you may remove then at night.  ?Make sure you keep all of your appointments after your operation with all of your doctors and caregivers. You should call the office at the above phone number and make an appointment for approximately two weeks after the date of your surgery. ?Please pick up a stool softener and laxative for home use as long as you are requiring pain medications. ?ICE to the affected hip every three hours for 30 minutes at a time and then as needed for pain and swelling. Continue to use ice on the hip for pain and swelling from surgery. You may notice swelling that will progress down to the foot and ankle.  This is normal after surgery.  Elevate the leg when you are not up walking on it.   ?It is important for you to complete the blood thinner medication as prescribed by your doctor. ?Continue to use the breathing machine which will help keep your temperature down.  It is common for your temperature to cycle up and down following surgery, especially at night when you are not up moving around and exerting yourself.  The breathing machine keeps your lungs expanded and your temperature down. ? ?RANGE OF MOTION AND STRENGTHENING EXERCISES  ?These exercises are designed to help you   keep full movement of your hip joint. Follow your caregiver's or physical therapist's instructions. Perform all exercises about fifteen times, three times per day or as directed. Exercise both hips, even if you have had only one joint replacement. These exercises can be done on a training (exercise) mat, on the floor, on a table or on a bed. Use whatever works the best and is most comfortable for you. Use music or television while you are exercising so that the exercises are a  pleasant break in your day. This will make your life better with the exercises acting as a break in routine you can look forward to.  ?Lying on your back, slowly slide your foot toward your buttocks, raising your knee up off the floor. Then slowly slide your foot back down until your leg is straight again.  ?Lying on your back spread your legs as far apart as you can without causing discomfort.  ?Lying on your side, raise your upper leg and foot straight up from the floor as far as is comfortable. Slowly lower the leg and repeat.  ?Lying on your back, tighten up the muscle in the front of your thigh (quadriceps muscles). You can do this by keeping your leg straight and trying to raise your heel off the floor. This helps strengthen the largest muscle supporting your knee.  ?Lying on your back, tighten up the muscles of your buttocks both with the legs straight and with the knee bent at a comfortable angle while keeping your heel on the floor.  ? ?SKILLED REHAB INSTRUCTIONS: ?If the patient is transferred to a skilled rehab facility following release from the hospital, a list of the current medications will be sent to the facility for the patient to continue.  When discharged from the skilled rehab facility, please have the facility set up the patient's Home Health Physical Therapy prior to being released. Also, the skilled facility will be responsible for providing the patient with their medications at time of release from the facility to include their pain medication and their blood thinner medication. If the patient is still at the rehab facility at time of the two week follow up appointment, the skilled rehab facility will also need to assist the patient in arranging follow up appointment in our office and any transportation needs. ? ?POST-OPERATIVE OPIOID TAPER INSTRUCTIONS: ?It is important to wean off of your opioid medication as soon as possible. If you do not need pain medication after your surgery it is ok  to stop day one. ?Opioids include: ?Codeine, Hydrocodone(Norco, Vicodin), Oxycodone(Percocet, oxycontin) and hydromorphone amongst others.  ?Long term and even short term use of opiods can cause: ?Increased pain response ?Dependence ?Constipation ?Depression ?Respiratory depression ?And more.  ?Withdrawal symptoms can include ?Flu like symptoms ?Nausea, vomiting ?And more ?Techniques to manage these symptoms ?Hydrate well ?Eat regular healthy meals ?Stay active ?Use relaxation techniques(deep breathing, meditating, yoga) ?Do Not substitute Alcohol to help with tapering ?If you have been on opioids for less than two weeks and do not have pain than it is ok to stop all together.  ?Plan to wean off of opioids ?This plan should start within one week post op of your joint replacement. ?Maintain the same interval or time between taking each dose and first decrease the dose.  ?Cut the total daily intake of opioids by one tablet each day ?Next start to increase the time between doses. ?The last dose that should be eliminated is the evening dose.  ? ? ?MAKE   SURE YOU:  ?Understand these instructions.  ?Will watch your condition.  ?Will get help right away if you are not doing well or get worse. ? ?Pick up stool softner and laxative for home use following surgery while on pain medications. ?Do not remove your dressing. ?The dressing is waterproof--it is OK to take showers. ?Continue to use ice for pain and swelling after surgery. ?Do not use any lotions or creams on the incision until instructed by your surgeon. ?Total Hip Protocol. ? ?

## 2021-08-22 NOTE — Interval H&P Note (Signed)
History and Physical Interval Note: ? ?08/22/2021 ?7:22 AM ? ?Christian Ross  has presented today for surgery, with the diagnosis of Right hip osteoarthritis.  The various methods of treatment have been discussed with the patient and family. After consideration of risks, benefits and other options for treatment, the patient has consented to  Procedure(s) with comments: ?TOTAL HIP ARTHROPLASTY ANTERIOR APPROACH (Right) - 150 as a surgical intervention.  The patient's history has been reviewed, patient examined, no change in status, stable for surgery.  I have reviewed the patient's chart and labs.  Questions were answered to the patient's satisfaction.   ? ? ?Hilton Cork Janet Decesare ? ? ?

## 2021-08-22 NOTE — Interval H&P Note (Signed)
History and Physical Interval Note: ? ?08/22/2021 ?7:21 AM ? ?Christian Ross  has presented today for surgery, with the diagnosis of Right hip osteoarthritis.  The various methods of treatment have been discussed with the patient and family. After consideration of risks, benefits and other options for treatment, the patient has consented to  Procedure(s) with comments: ?TOTAL HIP ARTHROPLASTY ANTERIOR APPROACH (Right) - 150 as a surgical intervention.  The patient's history has been reviewed, patient examined, no change in status, stable for surgery.  I have reviewed the patient's chart and labs.  Questions were answered to the patient's satisfaction.   ? ? ?[name] ? ? ?

## 2021-08-22 NOTE — Anesthesia Preprocedure Evaluation (Addendum)
Anesthesia Evaluation  ?Patient identified by MRN, date of birth, ID band ?Patient awake ? ? ? ?Reviewed: ?Allergy & Precautions, NPO status , Patient's Chart, lab work & pertinent test results, reviewed documented beta blocker date and time  ? ?Airway ?Mallampati: III ? ?TM Distance: >3 FB ?Neck ROM: Full ? ? ? Dental ? ?(+) Dental Advisory Given,  ?  ?Pulmonary ?sleep apnea and Continuous Positive Airway Pressure Ventilation ,  ?  ?Pulmonary exam normal ?breath sounds clear to auscultation ? ? ? ? ? ? Cardiovascular ?hypertension (BP 140/81 in preop), Pt. on medications and Pt. on home beta blockers ?Normal cardiovascular exam ?Rhythm:Regular Rate:Normal ? ? ?  ?Neuro/Psych ? Headaches, negative psych ROS  ? GI/Hepatic ?negative GI ROS, Neg liver ROS,   ?Endo/Other  ?diabetes, Well Controlled, Type 2, Oral Hypoglycemic AgentsFS 136 this AM, a1c 6.9 ? Renal/GU ?Renal InsufficiencyRenal diseaseCr 1.54  ?negative genitourinary ?  ?Musculoskeletal ? ?(+) Arthritis , Osteoarthritis,   ? Abdominal ?  ?Peds ?negative pediatric ROS ?(+)  Hematology ?negative hematology ROS ?(+) Hb 13.3, plt 252   ?Anesthesia Other Findings ? ? Reproductive/Obstetrics ?negative OB ROS ? ?  ? ? ? ? ? ? ? ? ? ? ? ? ? ?  ?  ? ? ? ? ? ? ? ?Anesthesia Physical ?Anesthesia Plan ? ?ASA: 3 ? ?Anesthesia Plan: Spinal and MAC  ? ?Post-op Pain Management: Tylenol PO (pre-op)*  ? ?Induction:  ? ?PONV Risk Score and Plan: 2 and Propofol infusion and TIVA ? ?Airway Management Planned: Natural Airway and Nasal Cannula ? ?Additional Equipment: None ? ?Intra-op Plan:  ? ?Post-operative Plan:  ? ?Informed Consent: I have reviewed the patients History and Physical, chart, labs and discussed the procedure including the risks, benefits and alternatives for the proposed anesthesia with the patient or authorized representative who has indicated his/her understanding and acceptance.  ? ? ? ? ? ?Plan Discussed with:  CRNA ? ?Anesthesia Plan Comments:   ? ? ? ? ? ?Anesthesia Quick Evaluation ? ?

## 2021-08-22 NOTE — Anesthesia Procedure Notes (Signed)
Spinal ? ?Patient location during procedure: OR ?Start time: 08/22/2021 7:33 AM ?End time: 08/22/2021 7:38 AM ?Reason for block: surgical anesthesia ?Staffing ?Performed: resident/CRNA  ?Anesthesiologist: Pervis Hocking, DO ?Resident/CRNA: Gwyndolyn Saxon, CRNA ?Preanesthetic Checklist ?Completed: patient identified, IV checked, site marked, risks and benefits discussed, surgical consent, monitors and equipment checked, pre-op evaluation and timeout performed ?Spinal Block ?Patient position: sitting ?Prep: DuraPrep ?Patient monitoring: heart rate, continuous pulse ox and blood pressure ?Approach: midline ?Location: L3-4 ?Injection technique: single-shot ?Needle ?Needle type: Pencan  ?Needle gauge: 24 G ?Assessment ?Sensory level: T6 ?Events: CSF return ? ? ? ?

## 2021-08-22 NOTE — Evaluation (Signed)
Physical Therapy Evaluation ?Patient Details ?Name: Christian Ross ?MRN: [id number] ?DOB: 11-16-1940 ?Today's Date: 08/22/2021 ? ?History of Present Illness ? Patient is 81 y.o. male s/p Rt THA anterior approach on 08/22/21 with PMH significant for OA, DMII, HLD, HTN, SUNCT, Lt RC tear. ?  ?Clinical Impression ? QUASEAN FRYE is a 81 y.o. male POD 0 s/p Rt THA. Patient reports independence with mobility at baseline. Patient is now limited by functional impairments (see PT problem list below) and requires min guard/supervision for transfers and gait with RW. Patient was able to ambulate ~90 feet with RW and min guard and cues for safe walker management. Patient educated on safe sequencing for stair mobility and verbalized safe guarding position for people assisting with mobility. Patient instructed in exercises to facilitate ROM and circulation. Patient will benefit from continued skilled PT interventions to address impairments and progress towards PLOF. Patient has met mobility goals at adequate level for discharge home; will continue to follow if pt continues acute stay to progress towards Mod I goals.    ?   ? ?Recommendations for follow up therapy are one component of a multi-disciplinary discharge planning process, led by the attending physician.  Recommendations may be updated based on patient status, additional functional criteria and insurance authorization. ? ?Follow Up Recommendations Follow physician's recommendations for discharge plan and follow up therapies ? ?  ?Assistance Recommended at Discharge Frequent or constant Supervision/Assistance  ?Patient can return home with the following ? A little help with walking and/or transfers;A little help with bathing/dressing/bathroom;Assistance with cooking/housework;Direct supervision/assist for medications management;Assist for transportation;Help with stairs or ramp for entrance ? ?  ?Equipment Recommendations None recommended by PT  ?Recommendations for  Other Services ?    ?  ?Functional Status Assessment Patient has had a recent decline in their functional status and demonstrates the ability to make significant improvements in function in a reasonable and predictable amount of time.  ? ?  ?Precautions / Restrictions Precautions ?Precautions: Fall ?Restrictions ?Weight Bearing Restrictions: No ?Other Position/Activity Restrictions: WBAT  ? ?  ? ?Mobility ? Bed Mobility ?Overal bed mobility: Needs Assistance ?Bed Mobility: Supine to Sit ?  ?  ?Supine to sit: Supervision, HOB elevated ?  ?  ?General bed mobility comments: pt taking extra time to pivot to EOB, no assist needed. ?  ? ?Transfers ?Overall transfer level: Needs assistance ?Equipment used: Rolling walker (2 wheels) ?Transfers: Sit to/from Stand ?Sit to Stand: Supervision ?  ?  ?  ?  ?  ?General transfer comment: cues for technique to rise from EOB, no assist required for power up, supervision for safety. ?  ? ?Ambulation/Gait ?Ambulation/Gait assistance: Min guard ?Gait Distance (Feet): 90 Feet ?Assistive device: Rolling walker (2 wheels) ?Gait Pattern/deviations: Step-to pattern, Step-through pattern, Decreased stride length ?Gait velocity: decr ?  ?  ?General Gait Details: cues for safe step to pattern and proximity to RW, no LOB noted and pt steady throughout. ? ?Stairs ?Stairs: Yes ?Stairs assistance: Min guard ?Stair Management: One rail Left, Step to pattern, Forwards ?Number of Stairs: 3 ?General stair comments: VC's for sequencing "up with good, down with bad" no overt LOB noted. pt using single UE on rail for support. pt's spouse instructed on safe guarding for mobility. ? ?Wheelchair Mobility ?  ? ?Modified Rankin (Stroke Patients Only) ?  ? ?  ? ?Balance Overall balance assessment: Needs assistance ?Sitting-balance support: Feet supported ?Sitting balance-Leahy Scale: Good ?  ?  ?Standing balance support: Bilateral upper extremity supported,  During functional activity, Reliant on assistive  device for balance ?Standing balance-Leahy Scale: Fair ?  ?  ?  ?  ?  ?  ?  ?  ?  ?  ?  ?  ?   ? ? ? ?Pertinent Vitals/Pain Pain Assessment ?Pain Assessment: 0-10 ?Pain Score: 2  ?Pain Location: Rt hip ?Pain Descriptors / Indicators: Aching, Discomfort ?Pain Intervention(s): Limited activity within patient's tolerance, Monitored during session, Repositioned  ? ? ?Home Living Family/patient expects to be discharged to:: Private residence ?Living Arrangements: Spouse/significant other ?Available Help at Discharge: Family ?Type of Home: House ?Home Access: Stairs to enter ?Entrance Stairs-Rails: None ?Entrance Stairs-Number of Steps: 1 ?Alternate Level Stairs-Number of Steps: 6 ?Home Layout: Multi-level ?Home Equipment: Conservation officer, nature (2 wheels) ?   ?  ?Prior Function Prior Level of Function : Independent/Modified Independent ?  ?  ?  ?  ?  ?  ?  ?  ?  ? ? ?Hand Dominance  ? Dominant Hand: Right ? ?  ?Extremity/Trunk Assessment  ? Upper Extremity Assessment ?Upper Extremity Assessment: Overall WFL for tasks assessed ?  ? ?Lower Extremity Assessment ?Lower Extremity Assessment: Overall WFL for tasks assessed ?  ? ?Cervical / Trunk Assessment ?Cervical / Trunk Assessment: Normal  ?Communication  ? Communication: No difficulties  ?Cognition Arousal/Alertness: Awake/alert ?Behavior During Therapy: [facility] for tasks assessed/performed ?Overall Cognitive Status: Within Functional Limits for tasks assessed ?  ?  ?  ?  ?  ?  ?  ?  ?  ?  ?  ?  ?  ?  ?  ?  ?  ?  ?  ? ?  ?General Comments   ? ?  ?Exercises Total Joint Exercises ?Ankle Circles/Pumps: AROM, Both, 10 reps ?Quad Sets: AROM, Right, 5 reps ?Short Arc Quad: AROM, Right, 5 reps ?Heel Slides: AROM, Right, 5 reps ?Hip ABduction/ADduction: AROM, Right, 5 reps ?Long Arc Quad: AROM, Right, 5 reps  ? ?Assessment/Plan  ?  ?PT Assessment Patient needs continued PT services  ?PT Problem List Decreased strength;Decreased range of motion;Decreased activity tolerance;Decreased  mobility;Decreased balance;Decreased knowledge of use of DME;Decreased safety awareness;Decreased knowledge of precautions;Pain ? ?   ?  ?PT Treatment Interventions DME instruction;Gait training;Stair training;Functional mobility training;Therapeutic activities;Therapeutic exercise;Balance training;Patient/family education   ? ?PT Goals (Current goals can be found in the Care Plan section)  ?Acute Rehab PT Goals ?Patient Stated Goal: get home and back to hobbies ?PT Goal Formulation: With patient ?Time For Goal Achievement: 08/29/21 ?Potential to Achieve Goals: Good ? ?  ?Frequency 7X/week ?  ? ? ?Co-evaluation   ?  ?  ?  ?  ? ? ?  ?AM-PAC PT "6 Clicks" Mobility  ?Outcome Measure Help needed turning from your back to your side while in a flat bed without using bedrails?: A Little ?Help needed moving from lying on your back to sitting on the side of a flat bed without using bedrails?: A Little ?Help needed moving to and from a bed to a chair (including a wheelchair)?: A Little ?Help needed standing up from a chair using your arms (e.g., wheelchair or bedside chair)?: A Little ?Help needed to walk in hospital room?: A Little ?Help needed climbing 3-5 steps with a railing? : A Little ?6 Click Score: 18 ? ?  ?End of Session Equipment Utilized During Treatment: Gait belt ?Activity Tolerance: Patient tolerated treatment well ?Patient left: in chair;with call bell/phone within reach;with chair alarm set;with family/visitor present ?Nurse Communication: Mobility status ?PT Visit Diagnosis:  Muscle weakness (generalized) (M62.81);Difficulty in walking, not elsewhere classified (R26.2) ?  ? ?Time: 1136-1209 ?PT Time Calculation (min) (ACUTE ONLY): 33 min ? ? ?Charges:   PT Evaluation ?$PT Eval Low Complexity: 1 Low ?PT Treatments ?$Gait Training: 8-22 mins ?  ?   ? ? ?Gwynneth Albright PT, DPT ?Acute Rehabilitation Services ?Office [phone] ?Pager [phone]  ? ?Jacques Navy ?08/22/2021, 2:35 PM ?

## 2021-08-23 ENCOUNTER — Encounter (HOSPITAL_COMMUNITY): Payer: Self-pay | Admitting: Orthopedic Surgery

## 2021-08-26 ENCOUNTER — Other Ambulatory Visit: Payer: Self-pay | Admitting: *Deleted

## 2021-08-26 MED ORDER — FENOFIBRATE 145 MG PO TABS: 145.0000 mg | ORAL_TABLET | Freq: Every day | ORAL | 3 refills | Status: DC

## 2021-09-06 DIAGNOSIS — Z4789 Encounter for other orthopedic aftercare: Secondary | ICD-10-CM | POA: Diagnosis not present

## 2021-09-23 DIAGNOSIS — H2513 Age-related nuclear cataract, bilateral: Secondary | ICD-10-CM | POA: Diagnosis not present

## 2021-09-23 DIAGNOSIS — H52221 Regular astigmatism, right eye: Secondary | ICD-10-CM | POA: Diagnosis not present

## 2021-09-23 DIAGNOSIS — H524 Presbyopia: Secondary | ICD-10-CM | POA: Diagnosis not present

## 2021-09-23 DIAGNOSIS — E119 Type 2 diabetes mellitus without complications: Secondary | ICD-10-CM | POA: Diagnosis not present

## 2021-09-23 DIAGNOSIS — H35033 Hypertensive retinopathy, bilateral: Secondary | ICD-10-CM | POA: Diagnosis not present

## 2021-09-23 DIAGNOSIS — H5201 Hypermetropia, right eye: Secondary | ICD-10-CM | POA: Diagnosis not present

## 2021-09-23 DIAGNOSIS — H1045 Other chronic allergic conjunctivitis: Secondary | ICD-10-CM | POA: Diagnosis not present

## 2021-10-03 ENCOUNTER — Emergency Department (HOSPITAL_COMMUNITY): Payer: Medicare Other

## 2021-10-03 ENCOUNTER — Other Ambulatory Visit: Payer: Self-pay

## 2021-10-03 ENCOUNTER — Emergency Department (HOSPITAL_COMMUNITY)
Admission: EM | Admit: 2021-10-03 | Discharge: 2021-10-03 | Disposition: A | Discharge status: Home / Self Care | Payer: Medicare Other | Attending: Emergency Medicine | Admitting: Emergency Medicine

## 2021-10-03 ENCOUNTER — Encounter (HOSPITAL_COMMUNITY): Payer: Self-pay

## 2021-10-03 DIAGNOSIS — Z96641 Presence of right artificial hip joint: Secondary | ICD-10-CM | POA: Diagnosis not present

## 2021-10-03 DIAGNOSIS — N183 Chronic kidney disease, stage 3 unspecified: Secondary | ICD-10-CM | POA: Diagnosis not present

## 2021-10-03 DIAGNOSIS — Z7982 Long term (current) use of aspirin: Secondary | ICD-10-CM | POA: Diagnosis not present

## 2021-10-03 DIAGNOSIS — E1122 Type 2 diabetes mellitus with diabetic chronic kidney disease: Secondary | ICD-10-CM | POA: Diagnosis not present

## 2021-10-03 DIAGNOSIS — R0789 Other chest pain: Secondary | ICD-10-CM | POA: Diagnosis not present

## 2021-10-03 DIAGNOSIS — Z79899 Other long term (current) drug therapy: Secondary | ICD-10-CM | POA: Insufficient documentation

## 2021-10-03 DIAGNOSIS — Z85828 Personal history of other malignant neoplasm of skin: Secondary | ICD-10-CM | POA: Insufficient documentation

## 2021-10-03 DIAGNOSIS — I129 Hypertensive chronic kidney disease with stage 1 through stage 4 chronic kidney disease, or unspecified chronic kidney disease: Secondary | ICD-10-CM | POA: Diagnosis not present

## 2021-10-03 DIAGNOSIS — R079 Chest pain, unspecified: Secondary | ICD-10-CM | POA: Diagnosis not present

## 2021-10-03 LAB — BASIC METABOLIC PANEL
Anion gap: 8 (ref 5–15)
BUN: 26 mg/dL — ABNORMAL HIGH (ref 8–23)
CO2: 22 mmol/L (ref 22–32)
Calcium: 9.6 mg/dL (ref 8.9–10.3)
Chloride: 107 mmol/L (ref 98–111)
Creatinine, Ser: 1.3 mg/dL — ABNORMAL HIGH (ref 0.61–1.24)
GFR, Estimated: 56 mL/min — ABNORMAL LOW (ref >60)
Glucose, Bld: 178 mg/dL — ABNORMAL HIGH (ref 70–99)
Potassium: 4.1 mmol/L (ref 3.5–5.1)
Sodium: 137 mmol/L (ref 135–145)

## 2021-10-03 LAB — TROPONIN I (HIGH SENSITIVITY)
Troponin I (High Sensitivity): 6 ng/L (ref <18)
Troponin I (High Sensitivity): 5 ng/L

## 2021-10-03 LAB — CBC
HCT: 36.6 % — ABNORMAL LOW (ref 39.0–52.0)
Hemoglobin: 12.1 g/dL — ABNORMAL LOW (ref 13.0–17.0)
MCH: 32.4 pg (ref 26.0–34.0)
MCHC: 33.1 g/dL (ref 30.0–36.0)
MCV: 98.1 fL (ref 80.0–100.0)
Platelets: 358 10*3/uL (ref 150–400)
RBC: 3.73 MIL/uL — ABNORMAL LOW (ref 4.22–5.81)
RDW: 13.3 % (ref 11.5–15.5)
WBC: 7.1 10*3/uL (ref 4.0–10.5)
nRBC: 0 % (ref 0.0–0.2)

## 2021-10-03 NOTE — ED Triage Notes (Signed)
Patient reports that he has had intermittent mid chest pressure x 4 days. Patient denies SOB. Patient denies radiation of pain.  Patient denies chest pain at this time .

## 2021-10-03 NOTE — ED Provider Notes (Signed)
[facility] DEPT Provider Note   CSN: [id number] Arrival date & time: 10/03/21  1629  History Chief Complaint  Patient presents with   Chest Pain   Christian Ross is a 81 y.o. male.  81 year old male presents with 4-day history of gradually worsening constant substernal chest pressure.  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  He reports this chest pressure is constant and located substernally.  He was not doing anything out of the ordinary when it started.  No heart history, although reportedly does see a cardiologist prophylactically for annual checks.  He denies chest pain, radiation, dysphagia, abdominal pain, abdominal distention.  He is very specific when he says that his chest pressure and not pain.  He has not tried any OTC medications for this.  No aggravating factors.  Only relieving factor was his wife's lasagna for dinner tonight, after which the chest pain returned.   Home Medications Prior to Admission medications   Medication Sig Start Date End Date Taking? Authorizing Provider  Ascorbic Acid (VITAMIN C) 1000 MG tablet Take 1,000 mg by mouth daily.    [provider]  aspirin (ASPIRIN CHILDRENS) 81 MG chewable tablet Chew 1 tablet (81 mg total) by mouth 2 (two) times daily with a meal. 08/22/21 10/06/21  Swinteck, Aaron Edelman, MD  Berberine Chloride POWD Take 800 mg by mouth every morning. 2 caps every morning    [provider]  docusate sodium (COLACE) 100 MG capsule Take 1 capsule (100 mg total) by mouth 2 (two) times daily. 08/22/21 10/21/21  Swinteck, Aaron Edelman, MD  fenofibrate (TRICOR) 145 MG tablet Take 1 tablet (145 mg total) by mouth daily. 08/26/21   Nahser, Wonda Cheng, MD  glipiZIDE (GLUCOTROL XL) 10 MG 24 hr tablet Take 10 mg by mouth daily with breakfast.  11/08/18   [provider]  Glucosamine-Chondroit-Vit C-Mn (GLUCOSAMINE 1500 COMPLEX) CAPS  Take 2 tablets by mouth daily.    [provider]  HYDROcodone-acetaminophen (NORCO) 10-325 MG tablet Take 0.5 tablets by mouth every 4 (four) hours as needed. 08/22/21   Swinteck, Aaron Edelman, MD  icosapent Ethyl (VASCEPA) 1 g capsule Take 2 capsules (2 g total) by mouth 2 (two) times daily. 07/15/21   Nahser, Wonda Cheng, MD  lamoTRIgine (LAMICTAL) 25 MG tablet Take 3 tablets (75 mg total) by mouth 2 (two) times daily. 08/07/21   Suzzanne Cloud, NP  Lecithin 1200 MG CAPS Take 2,400 mg by mouth every morning.     [provider]  metoprolol tartrate (LOPRESSOR) 25 MG tablet TAKE 1 TABLET TWICE A DAY 08/07/21   Nahser, Wonda Cheng, MD  mometasone (NASONEX) 50 MCG/ACT nasal spray Place 2 sprays into the nose daily as needed (allergies).    [provider]  ondansetron (ZOFRAN) 4 MG tablet Take 1 tablet (4 mg total) by mouth every 8 (eight) hours as needed for nausea or vomiting. 08/22/21   Swinteck, Aaron Edelman, MD  OVER THE COUNTER MEDICATION Take 2 capsules by mouth every morning. [employer], Historical, MD  polyethylene glycol (MIRALAX) 17 g packet Take 17 g by mouth daily as needed for moderate constipation or severe constipation. 08/22/21   Swinteck, Aaron Edelman, MD  Saw Palmetto, Serenoa repens, (SAW PALMETTO PO) Take 2 capsules by mouth in the morning and at bedtime.     [provider]  senna (SENOKOT) 8.6 MG TABS tablet Take 2  tablets (17.2 mg total) by mouth at bedtime. 08/22/21 10/21/21  Swinteck, Aaron Edelman, MD  topiramate (TOPAMAX) 50 MG tablet Take 1 tablet (50 mg total) by mouth 2 (two) times daily. 08/07/21   Suzzanne Cloud, NP  TURMERIC PO Take 2 capsules by mouth in the morning and at bedtime.    [provider]  Ubiquinol 200 MG CAPS Take 400 mg by mouth in the morning and at bedtime.     [provider]  irbesartan (AVAPRO) 150 MG tablet Take 1 tablet (150 mg total) by mouth daily. 06/24/11 06/26/11  Nahser, Wonda Cheng, MD     Allergies    Lisinopril     Review of Systems   Review of Systems  Constitutional:  Negative for activity change, appetite change, chills, diaphoresis, fatigue and fever.  HENT:  Negative for congestion, ear pain, rhinorrhea, sinus pressure, sinus pain, sneezing and sore throat.   Eyes:  Negative for pain, redness, itching and visual disturbance.  Respiratory:  Negative for cough, choking, chest tightness, shortness of breath, wheezing and stridor.   Cardiovascular:  Negative for chest pain and leg swelling.  Gastrointestinal:  Negative for abdominal distention, abdominal pain, blood in stool, constipation, diarrhea, nausea and vomiting.  Genitourinary:  Negative for difficulty urinating and frequency.  Neurological:  Negative for dizziness, weakness, light-headedness and headaches.    Physical Exam Updated Vital Signs BP (!) 170/77 (BP Location: Left Arm)   Pulse 85   Temp 98.4 F (36.9 C) (Oral)   Resp 18   Ht 5' 11.5" (1.816 m)   Wt 92.1 kg   SpO2 96%   BMI 27.92 kg/m  Physical Exam Vitals and nursing note reviewed.  Constitutional:      General: He is not in acute distress.    Appearance: He is well-developed and normal weight. He is not ill-appearing, toxic-appearing or diaphoretic.  HENT:     Head: Normocephalic and atraumatic.  Eyes:     Extraocular Movements: Extraocular movements intact.  Cardiovascular:     Rate and Rhythm: Normal rate and regular rhythm.     Heart sounds: Normal heart sounds. No murmur heard. Pulmonary:     Effort: Pulmonary effort is normal. No tachypnea, accessory muscle usage or respiratory distress.     Breath sounds: Normal breath sounds.  Chest:     Chest wall: No deformity or tenderness.  Abdominal:     General: Bowel sounds are normal.     Palpations: Abdomen is soft. There is no mass.     Tenderness: There is no abdominal tenderness. There is no rebound.  Musculoskeletal:     Cervical back: Normal range of motion.  Skin:    General: Skin is warm.      Capillary Refill: Capillary refill takes less than 2 seconds.  Neurological:     General: No focal deficit present.     Mental Status: He is alert.  Psychiatric:        Mood and Affect: Mood normal.        Behavior: Behavior normal.     ED Results / Procedures / Treatments   Labs (all labs ordered are listed, but only abnormal results are displayed) Labs Reviewed  BASIC METABOLIC PANEL  CBC  TROPONIN I (HIGH SENSITIVITY)    EKG None  Radiology No results found.  Procedures Procedures   Medications Ordered in ED Medications - No data to display  ED Course/ Medical Decision Making/ A&P  Medical Decision Making 81 year old male with 4 days of worsening chest pressure.  No red flag symptoms.  EKG shows normal sinus rhythm without ST elevation.  CXR negative for acute processes.  Troponins 6 and 5.  BMP unremarkable, consistent with known T2DM and CKD 3.  CBC demonstrated slight anemia at 12.1, otherwise unremarkable.  Ruled out STEMI, pneumonia, carditis, electrolyte abnormality.  Discharged with close PCP follow-up.  Return precautions discussed, see AVS for more.  Amount and/or Complexity of Data Reviewed Labs: ordered.    Details: Troponin, BMP, CBC Radiology: ordered.    Details: CXR ECG/medicine tests: ordered.    Details: ECG   Final Clinical Impression(s) / ED Diagnoses Final diagnoses:  None   Rx / DC Orders ED Discharge Orders     None      Ezequiel Essex, MD   Ezequiel Essex, MD 10/03/21 2004    Lucrezia Starch, MD 10/04/21 2332

## 2021-10-03 NOTE — Discharge Instructions (Signed)
Dear Christian Ross,  Thank you for letting us participate in your care. You were seen in the ED for chest pressure.  We ruled out heart attack, heart strain, pneumonia, and electrolyte abnormalities.  We did not see anything for which need to be hospitalized today.  Please follow-up with your PCP within about a week.   DOCTOR'S APPOINTMENT   Future Appointments  Date Time Provider [place]  02/05/2022  8:45 AM Suzzanne Cloud, NP [facility] None    Follow-up Information     Juanell Fairly, MD. Go in 1 week.   Specialty: Specialist Contact information: [place] [address] [phone]         Go to  [facility] DEPT.   Specialty: Emergency Medicine Why: If symptoms worsen, As needed Contact information: [place] [id number] [place] [phone]

## 2021-10-04 DIAGNOSIS — Z471 Aftercare following joint replacement surgery: Secondary | ICD-10-CM | POA: Diagnosis not present

## 2021-10-04 DIAGNOSIS — Z96641 Presence of right artificial hip joint: Secondary | ICD-10-CM | POA: Diagnosis not present

## 2021-10-04 DIAGNOSIS — Z4789 Encounter for other orthopedic aftercare: Secondary | ICD-10-CM | POA: Diagnosis not present

## 2021-10-09 ENCOUNTER — Other Ambulatory Visit (HOSPITAL_BASED_OUTPATIENT_CLINIC_OR_DEPARTMENT_OTHER): Payer: Self-pay | Admitting: Family Medicine

## 2021-10-09 DIAGNOSIS — R1013 Epigastric pain: Secondary | ICD-10-CM

## 2021-10-09 DIAGNOSIS — K921 Melena: Secondary | ICD-10-CM | POA: Diagnosis not present

## 2021-10-13 ENCOUNTER — Ambulatory Visit (HOSPITAL_BASED_OUTPATIENT_CLINIC_OR_DEPARTMENT_OTHER)
Admission: RE | Admit: 2021-10-13 | Discharge: 2021-10-13 | Disposition: A | Discharge status: Home / Self Care | Payer: Medicare Other | Source: Ambulatory Visit | Attending: Family Medicine | Admitting: Family Medicine

## 2021-10-13 DIAGNOSIS — N200 Calculus of kidney: Secondary | ICD-10-CM | POA: Diagnosis not present

## 2021-10-13 DIAGNOSIS — R1013 Epigastric pain: Secondary | ICD-10-CM | POA: Insufficient documentation

## 2021-10-13 DIAGNOSIS — N281 Cyst of kidney, acquired: Secondary | ICD-10-CM | POA: Diagnosis not present

## 2021-10-16 ENCOUNTER — Encounter: Payer: Self-pay | Admitting: Cardiovascular Disease

## 2021-10-16 NOTE — Progress Notes (Signed)
Christian Ross Date of Birth  [birth year]             1. Hyperlipidemia 2.Hypertension  Notes prior to 2014:   Christian Ross seems to be doing well.  He continues to have elevations in his triglyceride levels.  He is tolerating the crestor now.    Sept. 15, 2014:   Still active flying, not exercising as much as he should.  No CP or dyspnea.  Sept. 24, 2015:  Christian Ross is doing well.  Has lost 10 lbs since  I last saw him.  Exercising more.  HbA1C  Has improved.   He is no longer flying because he is on Lamactil.    Sept. 26, 2016:     Doing well from a cardiac standpoint On Flomax for BPH.  No cardiac issues.   Sept. 19, 2017  Doing well from a cardiac standpoint. Had some foot issues.   Was in a boot for several months  Now has orthotic arch supports in his shoes.  Glucose has been slightly elevated.    October 20, 2016:  Christian Ross is seen today for follow up .  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 25, 2019:  No longer is flying . He is off the meds that prevented him from passing his medical  No CP or dyspnea Knows that he needs to exercise more His medical doctor is following his DM and lipids   Feb. 25, 2020: He was seen today for follow-up of his chest pain and hyperlipidemia. Has a question about Vascepa  He had recent labs drawn drawn at his primary medical office.  His last cholesterol was 213.  His HDL is 31.  The LDL is 125.  The triglyceride level is 281.  Open A1c is 7.7.  Hemoglobin is 13.2.  TSH is 3.79. Still eating lots of carbs and fats Is not walking    Feb. 24, 2021 Christian Ross is seen for follow up of his CP, HTN, HLD We started Vascepa at his last visit  Recent Trigs are still markedly elevated - 344 Chol = 188,  LDL is 101 BMP is stable  Eating more sweets than he should.  Is not exercising much   July 30, 2020: Christian Ross is seen today for follow up of his CP, HTN, HLD  No CP or dyspnea  Was just  started on Repatha   October 18, 2021 Christian Ross is seen today for follow up of his HTN, HLD Is no longer flying  New Right hip since I last saw him  Having some stomach issues ,  recurrent abdomina pain  No cp , no dyspnea  Has become deconditioned   Had some chest pressure and went to the ER on June 15,  Work up was normal .  Troponins were negative  Has a soft systolic murmur  Will get an echo    Current Outpatient Medications on File Prior to Visit  Medication Sig Dispense Refill   Ascorbic Acid (VITAMIN C) 1000 MG tablet Take 1,000 mg by mouth daily.     aspirin 81 MG chewable tablet Chew 1 tablet by mouth in the morning.     Berberine Chloride POWD Take 800 mg by mouth every morning. 2 caps every morning     fenofibrate (TRICOR) 145 MG tablet Take 1 tablet (145 mg total) by mouth daily. 90 tablet 3   glipiZIDE (GLUCOTROL XL) 10 MG 24  hr tablet Take 10 mg by mouth daily with breakfast.      Glucosamine-Chondroit-Vit C-Mn (GLUCOSAMINE 1500 COMPLEX) CAPS Take 2 tablets by mouth daily.     icosapent Ethyl (VASCEPA) 1 g capsule Take 2 capsules (2 g total) by mouth 2 (two) times daily. 360 capsule 3   lamoTRIgine (LAMICTAL) 25 MG tablet Take 3 tablets (75 mg total) by mouth 2 (two) times daily. 540 tablet 3   Lecithin 1200 MG CAPS Take 2,400 mg by mouth every morning.      metoprolol tartrate (LOPRESSOR) 25 MG tablet TAKE 1 TABLET TWICE A DAY 180 tablet 3   OVER THE COUNTER MEDICATION Take 2 capsules by mouth every morning. Cool [employer], Serenoa repens, (SAW PALMETTO PO) Take 2 capsules by mouth in the morning and at bedtime.      topiramate (TOPAMAX) 50 MG tablet Take 1 tablet (50 mg total) by mouth 2 (two) times daily. 180 tablet 3   TURMERIC PO Take 2 capsules by mouth in the morning and at bedtime.     Ubiquinol 200 MG CAPS Take 400 mg by mouth in the morning and at bedtime.      [DISCONTINUED] irbesartan (AVAPRO) 150 MG tablet Take 1 tablet (150 mg total) by mouth  daily. 90 tablet 3   No current facility-administered medications on file prior to visit.    Allergies  Allergen Reactions   Lisinopril Cough    Past Medical History:  Diagnosis Date   Allergy    Arthritis    Cancer ([place])    skin pre cancer nose   Cough May 2016   new x30mo denies fever and shortness of breath   Diabetes mellitus without complication (HCC)    type 2    Diverticulitis    Erectile dysfunction    Hemorrhoids    History of skin cancer    basal cell removed left arm 09/07/14   Hx of colonic polyps    Hyperlipidemia    Hypertension    orthostatic   Hypertriglyceridemia    Rotator cuff tear    left   Sleep apnea    cpap setting at 6    SUNCT 05/14/12   SUNCT followed by Dr. WJannifer Franklin  Vitamin D deficiency     Past Surgical History:  Procedure Laterality Date   COLONOSCOPY W/ BIOPSIES AND POLYPECTOMY  multiple, 2005, 08, [place]  right / [place] WITH ROTATOR CUFF REPAIR AND SUBACROMIAL DECOMPRESSION Right 12/14/2019   Procedure: Right shoulder mini open rotator cuff repair, subacromial decompression,;  Surgeon: BSusa Day MD;  Location: [facility];  Service: Orthopedics;  Laterality: Right;  2 hrs Choice with Block   SHOULDER OPEN ROTATOR CUFF REPAIR Left 09/14/2014   Procedure: LEFT SHOULDER MINI OPEN ROTATOR CUFF REPAIR AND SUBACROMIAL DECOMPRESSION ;  Surgeon: JSusa Day MD;  Location: [facility];  Service: Orthopedics;  Laterality: Left;   TOTAL HIP ARTHROPLASTY Right 08/22/2021   Procedure: TOTAL HIP ARTHROPLASTY ANTERIOR APPROACH;  Surgeon: SRod Can MD;  Location: [facility];  Service: Orthopedics;  Laterality: Right;  150    Social History   Tobacco Use  Smoking Status Never  Smokeless Tobacco Never    Social History   Substance and Sexual Activity  Alcohol Use Yes   Comment: rarely    Family History  Problem  Relation Age of Onset   Colon  cancer Neg Hx    Colon polyps Neg Hx    Esophageal cancer Neg Hx    Rectal cancer Neg Hx    Stomach cancer Neg Hx     Reviw of Systems:  Reviewed in the HPI.  All other systems are negative.  Physical Exam: Blood pressure 133/84, pulse 71, height 5' 11.5" (1.816 m), weight 196 lb 3.2 oz (89 kg), SpO2 96 %.  GEN:  Well nourished, well developed in no acute distress HEENT: Normal NECK: No JVD; No carotid bruits LYMPHATICS: No lymphadenopathy CARDIAC: RRR soft systlic murmur  RESPIRATORY:  Clear to auscultation without rales, wheezing or rhonchi  ABDOMEN: Soft, non-tender, non-distended MUSCULOSKELETAL:  No edema; No deformity  SKIN: Warm and dry NEUROLOGIC:  Alert and oriented x 3   ECG: .     Assessment / Plan:   1. Hyperlipidemia-    lipids have been fairly well controlled.   2.Hypertension-      blood pressure is well controlled  2.  Systolic heart murmur: He has a soft systolic heart murmur.  We will get an echocardiogram for further evaluation    Mertie Moores, MD  10/18/2021 10:30 AM    [facility] [place],  [place], [place]  [address] Pager [phone] Phone: [phone]; Fax: [phone]

## 2021-10-17 ENCOUNTER — Encounter: Payer: Self-pay | Admitting: Physician Assistant

## 2021-10-18 ENCOUNTER — Ambulatory Visit (INDEPENDENT_AMBULATORY_CARE_PROVIDER_SITE_OTHER): Payer: Medicare Other | Admitting: Cardiovascular Disease

## 2021-10-18 ENCOUNTER — Encounter: Payer: Self-pay | Admitting: Cardiovascular Disease

## 2021-10-18 VITALS — BP 133/84 | HR 71 | Ht 71.5 in | Wt 196.2 lb

## 2021-10-18 DIAGNOSIS — I10 Essential (primary) hypertension: Secondary | ICD-10-CM

## 2021-10-18 DIAGNOSIS — R011 Cardiac murmur, unspecified: Secondary | ICD-10-CM

## 2021-10-18 DIAGNOSIS — E782 Mixed hyperlipidemia: Secondary | ICD-10-CM | POA: Diagnosis not present

## 2021-10-18 NOTE — Patient Instructions (Signed)
Medication Instructions:  Your physician recommends that you continue on your current medications as directed. Please refer to the Current Medication list given to you today.  *If you need a refill on your cardiac medications before your next appointment, please call your pharmacy*   Lab Work: NONE If you have labs (blood work) drawn today and your tests are completely normal, you will receive your results only by: [place] (if you have MyChart) OR A paper copy in the mail If you have any lab test that is abnormal or we need to change your treatment, we will call you to review the results.   Testing/Procedures: ECHO Your physician has requested that you have an echocardiogram. Echocardiography is a painless test that uses sound waves to create images of your heart. It provides your doctor with information about the size and shape of your heart and how well your heart's chambers and valves are working. This procedure takes approximately one hour. There are no restrictions for this procedure.  Follow-Up: At [facility], you and your health needs are our priority.  As part of our continuing mission to provide you with exceptional heart care, we have created designated Provider Care Teams.  These Care Teams include your primary Cardiologist (physician) and Advanced Practice Providers (APPs -  Physician Assistants and Nurse Practitioners) who all work together to provide you with the care you need, when you need it.  Your next appointment:   1 year(s)  The format for your next appointment:   In Person  Provider:   Ronn Melena, or Nahser {    Important Information About Sugar

## 2021-10-31 ENCOUNTER — Encounter: Payer: Self-pay | Admitting: *Deleted

## 2021-11-06 ENCOUNTER — Ambulatory Visit (HOSPITAL_COMMUNITY): Payer: Medicare Other

## 2021-11-13 ENCOUNTER — Encounter: Payer: Self-pay | Admitting: Physician Assistant

## 2021-11-13 ENCOUNTER — Ambulatory Visit (INDEPENDENT_AMBULATORY_CARE_PROVIDER_SITE_OTHER): Payer: Medicare Other | Admitting: Physician Assistant

## 2021-11-13 VITALS — BP 124/68 | HR 74 | Ht 71.5 in | Wt 195.0 lb

## 2021-11-13 DIAGNOSIS — R1013 Epigastric pain: Secondary | ICD-10-CM | POA: Diagnosis not present

## 2021-11-13 DIAGNOSIS — R195 Other fecal abnormalities: Secondary | ICD-10-CM | POA: Diagnosis not present

## 2021-11-13 DIAGNOSIS — K219 Gastro-esophageal reflux disease without esophagitis: Secondary | ICD-10-CM | POA: Diagnosis not present

## 2021-11-13 MED ORDER — OMEPRAZOLE 40 MG PO CPDR: 40.0000 mg | DELAYED_RELEASE_CAPSULE | Freq: Two times a day (BID) | ORAL | 2 refills | Status: DC

## 2021-11-13 NOTE — Progress Notes (Signed)
Chief Complaint: Weight Loss, Black Stool, Abdominal Pain  HPI:    Christian Ross is a 81 year old Caucasian male with a past medical history as listed below, known to Dr. Carlean Purl, who was referred to me by Christian Pepper, MD for a complaint of weight loss, black stool and abdominal pain.      06/16/2018 colonoscopy for surveillance given patient's history of adenomatous polyps with diverticulosis in the sigmoid colon otherwise normal.  No repeat due to age.    08/22/2021 right total hip arthroplasty.    10/03/2021 patient seen in the ER for chest pain.  EKG normal.  Imaging normal.  CBC with a hemoglobin of 12.1 (13.33 months ago).    10/13/2021 CT of the abdomen without contrast for epigastric pain for the past 2 weeks with no acute abdominal findings, mass lesions in the or adenopathy.    10/18/2021 patient seen by cardiology in that time describes some stomach issues.  He was noted to have a soft systolic murmur and an echo was ordered.  This was not completed yet.    Today, the patient tells me that over the past month or so he has been experiencing a pain just under his sternum typically when he is sleeping or when he wakes up in the morning about 2 times a week.  He usually grabs a Pepcid which does seem to help this pain.  Recently he was started on Omeprazole 20 mg once daily but has only taken 2 or 3 doses of this.  Tells me that he has noticed some black stools as well, but these only correlate with times when he has used some Pepto-Bismol.  He has not seen any in between and otherwise has normal soft solid stools.  Also describes weight loss but he was trying to lose weight on a weight loss program and lost about 10 to 15 pounds.  Denies NSAID use other than his low-dose aspirin for cardiovascular reasons.  Denies changes in diet or other medications.  Does tell me typically only eats 2 meals a day 1 in the morning and then again around 4 PM and then has a late night snack.    Denies fever, chills,  unintentional weight loss, nausea or vomiting.  Past Medical History:  Diagnosis Date   Allergy    Aortic atherosclerosis ([place])    Arthritis    Cancer ([place])    skin pre cancer nose   Cough 08/2014   new x39mo denies fever and shortness of breath   Diabetes mellitus without complication (HCC)    type 2    Diverticulitis    Erectile dysfunction    Hemorrhoids    History of skin cancer    basal cell removed left arm 09/07/14   Hx of colonic polyps    Hyperlipidemia    Hypertension    orthostatic   Hypertriglyceridemia    Kidney stones    Rotator cuff tear    left   Sleep apnea    cpap setting at 6    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 WJannifer Franklin  Vitamin D deficiency     Past Surgical History:  Procedure Laterality Date   COLONOSCOPY W/ BIOPSIES AND POLYPECTOMY  multiple, 2005, 08, 1[place]  right / for osgood- schlatter   NASAL SINUS SURGERY     RECTAL BIOPSY     SHOULDER ARTHROSCOPY WITH ROTATOR CUFF REPAIR AND SUBACROMIAL DECOMPRESSION Right 12/14/2019   Procedure:  Right shoulder mini open rotator cuff repair, subacromial decompression,;  Surgeon: Susa Day, MD;  Location: [facility];  Service: Orthopedics;  Laterality: Right;  2 hrs Choice with Block   SHOULDER OPEN ROTATOR CUFF REPAIR Left 09/14/2014   Procedure: LEFT SHOULDER MINI OPEN ROTATOR CUFF REPAIR AND SUBACROMIAL DECOMPRESSION ;  Surgeon: Susa Day, MD;  Location: [facility];  Service: Orthopedics;  Laterality: Left;   TOTAL HIP ARTHROPLASTY Right 08/22/2021   Procedure: TOTAL HIP ARTHROPLASTY ANTERIOR APPROACH;  Surgeon: Rod Can, MD;  Location: [facility];  Service: Orthopedics;  Laterality: Right;  150    Current Outpatient Medications  Medication Sig Dispense Refill   Ascorbic Acid (VITAMIN C) 1000 MG tablet Take 1,000 mg by mouth daily.     aspirin 81 MG chewable tablet Chew 1 tablet by mouth in the morning.     Berberine Chloride POWD Take 800 mg by mouth every morning. 2 caps  every morning     fenofibrate (TRICOR) 145 MG tablet Take 1 tablet (145 mg total) by mouth daily. 90 tablet 3   glipiZIDE (GLUCOTROL XL) 10 MG 24 hr tablet Take 10 mg by mouth daily with breakfast.      Glucosamine-Chondroit-Vit C-Mn (GLUCOSAMINE 1500 COMPLEX) CAPS Take 2 tablets by mouth daily.     icosapent Ethyl (VASCEPA) 1 g capsule Take 2 capsules (2 g total) by mouth 2 (two) times daily. 360 capsule 3   lamoTRIgine (LAMICTAL) 25 MG tablet Take 3 tablets (75 mg total) by mouth 2 (two) times daily. 540 tablet 3   Lecithin 1200 MG CAPS Take 2,400 mg by mouth every morning.      metoprolol tartrate (LOPRESSOR) 25 MG tablet TAKE 1 TABLET TWICE A DAY 180 tablet 3   OVER THE COUNTER MEDICATION Take 2 capsules by mouth every morning. Cool [employer], Serenoa repens, (SAW PALMETTO PO) Take 2 capsules by mouth in the morning and at bedtime.      topiramate (TOPAMAX) 50 MG tablet Take 1 tablet (50 mg total) by mouth 2 (two) times daily. 180 tablet 3   TURMERIC PO Take 2 capsules by mouth in the morning and at bedtime.     Ubiquinol 200 MG CAPS Take 400 mg by mouth in the morning and at bedtime.      No current facility-administered medications for this visit.    Allergies as of 11/13/2021 - Review Complete 10/18/2021  Allergen Reaction Noted   Lisinopril Cough 10/31/2010    Family History  Problem Relation Age of Onset   Colon cancer Neg Hx    Colon polyps Neg Hx    Esophageal cancer Neg Hx    Rectal cancer Neg Hx    Stomach cancer Neg Hx     Social History   Socioeconomic History   Marital status: Married    Spouse name: Not on file   Number of children: 2   Years of education: college   Highest education level: Not on file  Occupational History   Occupation: Futures trader: RETIRED  Tobacco Use   Smoking status: Never   Smokeless tobacco: Never  Vaping Use   Vaping Use: Never used  Substance and Sexual Activity   Alcohol use: Yes    Comment: rarely    Drug use: No   Sexual activity: Never  Other Topics Concern   Not on file  Social History Narrative   Lives at home w/ wife   Patient drinks a couple  of sodas a week.   Patient is right handed.    Social Determinants of Health   Financial Resource Strain: Not on file  Food Insecurity: Not on file  Transportation Needs: Not on file  Physical Activity: Not on file  Stress: Not on file  Social Connections: Not on file  Intimate Partner Violence: Not on file    Review of Systems:    Constitutional: No weight loss, fever or chills Skin: No rash  Cardiovascular: No chest pain Respiratory: No SOB  Gastrointestinal: See HPI and otherwise negative Genitourinary: No dysuria  Neurological: No headache, dizziness or syncope Musculoskeletal: No new muscle or joint pain Hematologic: No bleeding or bruising Psychiatric: No history of depression or anxiety   Physical Exam:  Vital signs: BP 124/68   Pulse 74   Ht 5' 11.5" (1.816 m)   Wt 195 lb (88.5 kg)   SpO2 98%   BMI 26.82 kg/m    Constitutional:   Pleasant Elderly Caucasian male appears to be in NAD, Well developed, Well nourished, alert and cooperative Head:  Normocephalic and atraumatic. Eyes:   PEERL, EOMI. No icterus. Conjunctiva pink. Ears:  Normal auditory acuity. Neck:  Supple Throat: Oral cavity and pharynx without inflammation, swelling or lesion.  Respiratory: Respirations even and unlabored. Lungs clear to auscultation bilaterally.   No wheezes, crackles, or rhonchi.  Cardiovascular: Normal S1, S2. No MRG. Regular rate and rhythm. No peripheral edema, cyanosis or pallor.  Gastrointestinal:  Soft, nondistended, nontender. No rebound or guarding. Normal bowel sounds. No appreciable masses or hepatomegaly. Rectal:  Not performed.  Msk:  Symmetrical without gross deformities. Without edema, no deformity or joint abnormality.  Neurologic:  Alert and  oriented x4;  grossly normal neurologically.  Skin:   Dry and intact  without significant lesions or rashes. Psychiatric: Demonstrates good judgement and reason without abnormal affect or behaviors.  RELEVANT LABS AND IMAGING: CBC    Component Value Date/Time   WBC 7.1 10/03/2021 1707   RBC 3.73 (L) 10/03/2021 1707   HGB 12.1 (L) 10/03/2021 1707   HCT 36.6 (L) 10/03/2021 1707   PLT 358 10/03/2021 1707   MCV 98.1 10/03/2021 1707   MCH 32.4 10/03/2021 1707   MCHC 33.1 10/03/2021 1707   RDW 13.3 10/03/2021 1707   LYMPHSABS 2.5 10/15/2010 2025   MONOABS 1.3 (H) 10/15/2010 2025   EOSABS 0.3 10/15/2010 2025   BASOSABS 0.0 10/15/2010 2025    CMP     Component Value Date/Time   NA 137 10/03/2021 1707   NA 140 06/12/2020 0806   K 4.1 10/03/2021 1707   CL 107 10/03/2021 1707   CO2 22 10/03/2021 1707   GLUCOSE 178 (H) 10/03/2021 1707   BUN 26 (H) 10/03/2021 1707   BUN 23 06/12/2020 0806   CREATININE 1.30 (H) 10/03/2021 1707   CALCIUM 9.6 10/03/2021 1707   PROT 7.3 06/12/2020 0806   ALBUMIN 4.4 06/12/2020 0806   AST 25 06/12/2020 0806   ALT 27 06/12/2020 0806   ALKPHOS 46 06/12/2020 0806   BILITOT 0.4 06/12/2020 0806   GFRNONAA 56 (L) 10/03/2021 1707   GFRAA 57 (L) 06/12/2020 0806    Assessment: 1.  Epigastric pain: Typically at night to times a week, just under his sternum, otherwise he does not typically feel this pain, Pepto helps and so does Pepcid; most likely GERD/esophagitis 2.  Dark stools: Likely from Pepto-Bismol, hemoglobin recently just below normal for him  Plan: 1.  Discussed patient's weight loss with him.  This was intentional in a weight loss program.  Also discussed dark stools, this was only after using Pepto-Bismol which is likely the cause. 2.  Discussed pain which sounds like it is esophageal from reflux while he is sleeping.  He does eat snacks late at night which could be adding to this.  This does not bother him during the day.  Advised him to stop his late night snacking. 3.  Discussed conservative management with him  given his age and increased risk for anesthesia, he is also currently being worked up for new heart murmur.  Started him on Omeprazole 40 mg twice a day, 30-60 minutes before breakfast and then again before his evening meal.  Prescribed #60 with 2 refills. 4.  Patient will call if he does not see decrease in symptoms after using the above for a couple of weeks.  If symptoms continue then would recommend that we do an EGD for further evaluation 5.  Overall discussed with patient I would like him to be on the Omeprazole for at least 2 months and then we can taper off. 6.  Patient was scheduled to follow-up in clinic in the next 4 to 6 weeks with me.  He will call before then with any concerns.  Christian Newer, PA-C [facility] 11/13/2021, 8:53 AM  Cc: Christian Pepper, MD

## 2021-11-13 NOTE — Patient Instructions (Addendum)
We have sent the following medications to your pharmacy for you to pick up at your convenience: Omeprazole 40 mg twice daily (increase from previous dosing)  You have been scheduled for a follow up with Ellouise Newer, PA-C on Tuesday, 12/17/21 at 1:30 pm.  If you are age 79 or older, your body mass index should be between 23-30. Your Body mass index is 26.82 kg/m. If this is out of the aforementioned range listed, please consider follow up with your Primary Care Provider.  If you are age 67 or younger, your body mass index should be between 19-25. Your Body mass index is 26.82 kg/m. If this is out of the aformentioned range listed, please consider follow up with your Primary Care Provider.   ________________________________________________________  The [facility] providers would like to encourage you to use [facility] to communicate with providers for non-urgent requests or questions.  Due to long hold times on the telephone, sending your provider a message by [facility] may be a faster and more efficient way to get a response.  Please allow 48 business hours for a response.  Please remember that this is for non-urgent requests.  _______________________________________________________  Due to recent changes in healthcare laws, you may see the results of your imaging and laboratory studies on MyChart before your provider has had a chance to review them.  We understand that in some cases there may be results that are confusing or concerning to you. Not all laboratory results come back in the same time frame and the provider may be waiting for multiple results in order to interpret others.  Please give [place] 48 hours in order for your provider to thoroughly review all the results before contacting the office for clarification of your results.

## 2021-11-15 ENCOUNTER — Ambulatory Visit (HOSPITAL_COMMUNITY): Payer: Medicare Other | Attending: Cardiovascular Disease

## 2021-11-15 DIAGNOSIS — R011 Cardiac murmur, unspecified: Secondary | ICD-10-CM | POA: Diagnosis not present

## 2021-11-16 LAB — ECHOCARDIOGRAM COMPLETE
AR max vel: 2.97 cm2
AV Area VTI: 2.83 cm2
AV Area mean vel: 2.54 cm2
AV Mean grad: 8.2 mmHg
AV Peak grad: 12.4 mmHg
Ao pk vel: 1.76 m/s
Area-P 1/2: 2.95 cm2
S' Lateral: 2.9 cm

## 2021-12-04 ENCOUNTER — Emergency Department (HOSPITAL_COMMUNITY)
Admission: EM | Admit: 2021-12-04 | Discharge: 2021-12-05 | Disposition: A | Discharge status: Home / Self Care | Payer: Medicare Other | Attending: Emergency Medicine | Admitting: Emergency Medicine

## 2021-12-04 ENCOUNTER — Other Ambulatory Visit: Payer: Self-pay

## 2021-12-04 ENCOUNTER — Emergency Department (HOSPITAL_COMMUNITY): Payer: Medicare Other

## 2021-12-04 DIAGNOSIS — W01198A Fall on same level from slipping, tripping and stumbling with subsequent striking against other object, initial encounter: Secondary | ICD-10-CM | POA: Diagnosis not present

## 2021-12-04 DIAGNOSIS — I6523 Occlusion and stenosis of bilateral carotid arteries: Secondary | ICD-10-CM | POA: Diagnosis not present

## 2021-12-04 DIAGNOSIS — E1122 Type 2 diabetes mellitus with diabetic chronic kidney disease: Secondary | ICD-10-CM | POA: Diagnosis not present

## 2021-12-04 DIAGNOSIS — Z7984 Long term (current) use of oral hypoglycemic drugs: Secondary | ICD-10-CM | POA: Diagnosis not present

## 2021-12-04 DIAGNOSIS — Z7982 Long term (current) use of aspirin: Secondary | ICD-10-CM | POA: Insufficient documentation

## 2021-12-04 DIAGNOSIS — I129 Hypertensive chronic kidney disease with stage 1 through stage 4 chronic kidney disease, or unspecified chronic kidney disease: Secondary | ICD-10-CM | POA: Insufficient documentation

## 2021-12-04 DIAGNOSIS — R1111 Vomiting without nausea: Secondary | ICD-10-CM | POA: Diagnosis not present

## 2021-12-04 DIAGNOSIS — E1165 Type 2 diabetes mellitus with hyperglycemia: Secondary | ICD-10-CM | POA: Diagnosis not present

## 2021-12-04 DIAGNOSIS — Z79899 Other long term (current) drug therapy: Secondary | ICD-10-CM | POA: Insufficient documentation

## 2021-12-04 DIAGNOSIS — R110 Nausea: Secondary | ICD-10-CM | POA: Diagnosis not present

## 2021-12-04 DIAGNOSIS — I619 Nontraumatic intracerebral hemorrhage, unspecified: Secondary | ICD-10-CM | POA: Diagnosis not present

## 2021-12-04 DIAGNOSIS — N189 Chronic kidney disease, unspecified: Secondary | ICD-10-CM | POA: Diagnosis not present

## 2021-12-04 DIAGNOSIS — S060X1A Concussion with loss of consciousness of 30 minutes or less, initial encounter: Secondary | ICD-10-CM | POA: Diagnosis not present

## 2021-12-04 DIAGNOSIS — M50322 Other cervical disc degeneration at C5-C6 level: Secondary | ICD-10-CM | POA: Diagnosis not present

## 2021-12-04 DIAGNOSIS — W19XXXA Unspecified fall, initial encounter: Secondary | ICD-10-CM | POA: Diagnosis not present

## 2021-12-04 DIAGNOSIS — I672 Cerebral atherosclerosis: Secondary | ICD-10-CM | POA: Diagnosis not present

## 2021-12-04 DIAGNOSIS — R55 Syncope and collapse: Secondary | ICD-10-CM | POA: Diagnosis not present

## 2021-12-04 DIAGNOSIS — S0990XA Unspecified injury of head, initial encounter: Secondary | ICD-10-CM | POA: Diagnosis not present

## 2021-12-04 DIAGNOSIS — Z043 Encounter for examination and observation following other accident: Secondary | ICD-10-CM | POA: Diagnosis not present

## 2021-12-04 DIAGNOSIS — I10 Essential (primary) hypertension: Secondary | ICD-10-CM | POA: Diagnosis not present

## 2021-12-04 DIAGNOSIS — R42 Dizziness and giddiness: Secondary | ICD-10-CM | POA: Insufficient documentation

## 2021-12-04 LAB — COMPREHENSIVE METABOLIC PANEL
ALT: 29 U/L (ref 0–44)
AST: 45 U/L — ABNORMAL HIGH (ref 15–41)
Albumin: 3.8 g/dL (ref 3.5–5.0)
Alkaline Phosphatase: 36 U/L — ABNORMAL LOW (ref 38–126)
Anion gap: 9 (ref 5–15)
BUN: 30 mg/dL — ABNORMAL HIGH (ref 8–23)
CO2: 23 mmol/L (ref 22–32)
Calcium: 9.9 mg/dL (ref 8.9–10.3)
Chloride: 109 mmol/L (ref 98–111)
Creatinine, Ser: 1.48 mg/dL — ABNORMAL HIGH (ref 0.61–1.24)
GFR, Estimated: 48 mL/min — ABNORMAL LOW (ref >60)
Glucose, Bld: 141 mg/dL — ABNORMAL HIGH (ref 70–99)
Potassium: 4.4 mmol/L (ref 3.5–5.1)
Sodium: 141 mmol/L (ref 135–145)
Total Bilirubin: 0.5 mg/dL (ref 0.3–1.2)
Total Protein: 7.4 g/dL (ref 6.5–8.1)

## 2021-12-04 LAB — CBC
HCT: 36.4 % — ABNORMAL LOW (ref 39.0–52.0)
Hemoglobin: 11.9 g/dL — ABNORMAL LOW (ref 13.0–17.0)
MCH: 31.3 pg (ref 26.0–34.0)
MCHC: 32.7 g/dL (ref 30.0–36.0)
MCV: 95.8 fL (ref 80.0–100.0)
Platelets: 263 10*3/uL (ref 150–400)
RBC: 3.8 MIL/uL — ABNORMAL LOW (ref 4.22–5.81)
RDW: 14.8 % (ref 11.5–15.5)
WBC: 5.7 10*3/uL (ref 4.0–10.5)
nRBC: 0 % (ref 0.0–0.2)

## 2021-12-04 MED ORDER — SODIUM CHLORIDE 0.9 % IV BOLUS
500.0000 mL | Freq: Once | INTRAVENOUS | Status: AC
  Administered 2021-12-04: 500 mL via INTRAVENOUS

## 2021-12-04 MED ORDER — ONDANSETRON 4 MG PO TBDP: 4.0000 mg | ORAL_TABLET | Freq: Three times a day (TID) | ORAL | 0 refills | Status: DC | PRN

## 2021-12-04 MED ORDER — IOHEXOL 350 MG/ML SOLN
75.0000 mL | Freq: Once | INTRAVENOUS | Status: AC | PRN
  Administered 2021-12-04: 75 mL via INTRAVENOUS

## 2021-12-04 NOTE — ED Notes (Signed)
Pt returned from CT °

## 2021-12-04 NOTE — Discharge Instructions (Addendum)
You likely suffered a bad concussion.  You may experience symptoms of headache, nausea, dizziness, difficulty concentrating, and/or blurry vision over the next several days to weeks.  Avoid further head injuries while your brain is healing.  Avoid activities that worsen your symptoms.  Take ibuprofen and Tylenol as needed for headache pain.  A medication was sent to your pharmacy to treat nausea.  Take only as needed.  On your imaging results, there were no new findings.  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  Schedule a follow-up appointment with your neurologist to discuss further.  Return to the emergency department at any time for any new or worsening symptoms of concern.

## 2021-12-04 NOTE — ED Notes (Signed)
Patient transported to MRI 

## 2021-12-04 NOTE — ED Notes (Signed)
RN start of shift assessment: Pt is in semi fowlers position, well presenting, GCS 15, A&Ox4, no ccollar present, in no apparent distress, pupils eaqual and reactive to light (3), moving all extremities appropriately, vital signs within normal limits. Pt reports he was walking up the stairs when he believes he passed out and then remembers waking up at bottom of stairs, pt reports 5/6 stairs, roughly 39f. Pt then ambulated up the stairs and after episode of nausea/vomiting they decided to call 911 for evaluation. Pt reports 1/10 pain/soreness in shoulder/neck area.Pt reports taking low dose ASA (81 mg) daily. Pt denies headache, but endorses mild dizziness, feeling like hes "in a daze."

## 2021-12-04 NOTE — ED Notes (Signed)
Patient transported to CT 

## 2021-12-04 NOTE — ED Triage Notes (Signed)
Pt BIB EMS from home. Pt was walking up the stairs and fell backwards off the second step. Pt does not recall the fall. Post fall patient had an episode of vomiting. Pt is now alert and oriented x4. Patient denies nausea and at this time.Patient has a hematoma to the posterior head. Patient given '4mg'$  of zofran en route.   EMS vitals 160/82 18 RR 84 HR 99% RA 139 CBG

## 2021-12-04 NOTE — ED Notes (Signed)
Orthostatics completed on patient. Patient pressures remained stable, but pt incredibly unsteady on feet when assisted to stand by RN requiring stabilization to not fall forward/back. Pt denies baseline balance issues, stating that he does not use any mobility assistance devices at home. These are new symptoms post fall per patient. MD Dixon notified.

## 2021-12-04 NOTE — ED Provider Notes (Signed)
[facility] EMERGENCY DEPARTMENT Provider Note   CSN: [id number] Arrival date & time: 12/04/21  1444     History  Chief Complaint  Patient presents with   Christian Ross is a 81 y.o. male.  Patient with history of hypertension, hyperlipidemia, T2DM, CKD presents today with complaints of fall. States that earlier today he was carrying lightbulbs up the stairs and subsequently fell backwards and hit his head. He does not remember falling and states that he suspects that he passed out which lead him to fall. He denies any prodromal event including chest pain, shortness of breath, diaphoresis, lightheadedness, nausea, or vomiting. States that when he woke up on the ground he was disoriented and felt slightly dizzy, but was able to get up off the ground and walk around on scene, but states that he continued to feel 'in a daze,' and presents today with concern for same. He describes his dizziness as more of a lightheadedness and denies any room spinning sensation. Continues to deny headache, fevers, blurred vision, chest pain, shortness of breath, nausea, or vomiting.    The history is provided by the patient. No language interpreter was used.  Fall       Home Medications Prior to Admission medications   Medication Sig Start Date End Date Taking? Authorizing Provider  Ascorbic Acid (VITAMIN C) 1000 MG tablet Take 1,000 mg by mouth daily.    [provider]  aspirin 81 MG chewable tablet Chew 1 tablet by mouth in the morning.    [provider]  Berberine Chloride POWD Take 800 mg by mouth every morning. 2 caps every morning    [provider]  fenofibrate (TRICOR) 145 MG tablet Take 1 tablet (145 mg total) by mouth daily. 08/26/21   Nahser, Wonda Cheng, MD  glipiZIDE (GLUCOTROL XL) 10 MG 24 hr tablet Take 10 mg by mouth daily with breakfast.  11/08/18   [provider]  Glucosamine-Chondroit-Vit C-Mn (GLUCOSAMINE 1500 COMPLEX)  CAPS Take 2 tablets by mouth daily.    [provider]  icosapent Ethyl (VASCEPA) 1 g capsule Take 2 capsules (2 g total) by mouth 2 (two) times daily. 07/15/21   Nahser, Wonda Cheng, MD  lamoTRIgine (LAMICTAL) 25 MG tablet Take 3 tablets (75 mg total) by mouth 2 (two) times daily. 08/07/21   Suzzanne Cloud, NP  Lecithin 1200 MG CAPS Take 2,400 mg by mouth every morning.     [provider]  metoprolol tartrate (LOPRESSOR) 25 MG tablet TAKE 1 TABLET TWICE A DAY 08/07/21   Nahser, Wonda Cheng, MD  omeprazole (PRILOSEC) 40 MG capsule Take 1 capsule (40 mg total) by mouth 2 (two) times daily before a meal. 11/13/21   Lemmon, Lavone Nian, PA  OVER THE COUNTER MEDICATION Take 2 capsules by mouth every morning. [employer], Historical, MD  Saw Palmetto, Serenoa repens, (SAW PALMETTO PO) Take 2 capsules by mouth in the morning and at bedtime.     [provider]  topiramate (TOPAMAX) 50 MG tablet Take 1 tablet (50 mg total) by mouth 2 (two) times daily. 08/07/21   Suzzanne Cloud, NP  TURMERIC PO Take 2 capsules by mouth in the morning and at bedtime.    [provider]  Ubiquinol 200 MG CAPS Take 400 mg by mouth in the morning and at bedtime.     [provider]  irbesartan (AVAPRO) 150 MG tablet Take 1 tablet (150  mg total) by mouth daily. 06/24/11 06/26/11  Nahser, Wonda Cheng, MD      Allergies    Lisinopril    Review of Systems   Review of Systems  Neurological:  Positive for syncope and light-headedness.  All other systems reviewed and are negative.   Physical Exam Updated Vital Signs BP (!) 150/93   Pulse 70   Temp 98.7 F (37.1 C) (Oral)   Resp 14   Ht '5\' 11"'$  (1.803 m)   Wt 87.1 kg   SpO2 97%   BMI 26.78 kg/m  Physical Exam Vitals and nursing note reviewed.  Constitutional:      General: He is not in acute distress.    Appearance: Normal appearance. He is normal weight. He is not ill-appearing, toxic-appearing or diaphoretic.  HENT:      Head: Normocephalic and atraumatic.  Eyes:     Extraocular Movements: Extraocular movements intact.     Pupils: Pupils are equal, round, and reactive to light.  Cardiovascular:     Rate and Rhythm: Normal rate and regular rhythm.     Pulses: Normal pulses.     Heart sounds: Normal heart sounds.  Pulmonary:     Effort: Pulmonary effort is normal. No respiratory distress.     Breath sounds: Normal breath sounds.  Abdominal:     General: Abdomen is flat.  Musculoskeletal:        General: Normal range of motion.     Cervical back: Normal range of motion.  Skin:    General: Skin is warm and dry.     Capillary Refill: Capillary refill takes less than 2 seconds.  Neurological:     General: No focal deficit present.     Mental Status: He is alert and oriented to person, place, and time.     GCS: GCS eye subscore is 4. GCS verbal subscore is 5. GCS motor subscore is 6.     Cranial Nerves: Cranial nerves 2-12 are intact.     Sensory: Sensation is intact.     Motor: Motor function is intact.     Coordination: Coordination is intact.     Gait: Gait is intact.     Comments: Alert and oriented to self, place, time and event.    Speech is fluent, clear without dysarthria or dysphasia.    Strength 5/5 in upper/lower extremities   Sensation intact in upper/lower extremities    CN I not tested  CN II grossly intact visual fields bilaterally. Did not visualize posterior eye.  CN III, IV, VI PERRLA and EOMs intact bilaterally  CN V Intact sensation to sharp and light touch to the face  CN VII facial movements symmetric  CN VIII not tested  CN IX, X no uvula deviation, symmetric rise of soft palate  CN XI 5/5 SCM and trapezius strength bilaterally  CN XII Midline tongue protrusion, symmetric L/R movements   Psychiatric:        Mood and Affect: Mood normal.        Behavior: Behavior normal.     ED Results / Procedures / Treatments   Labs (all labs ordered are listed, but only  abnormal results are displayed) Labs Reviewed  CBC - Abnormal; Notable for the following components:      Result Value   RBC 3.80 (*)    Hemoglobin 11.9 (*)    HCT 36.4 (*)    All other components within normal limits  COMPREHENSIVE METABOLIC PANEL - Abnormal; Notable for the following  components:   Glucose, Bld 141 (*)    BUN 30 (*)    Creatinine, Ser 1.48 (*)    AST 45 (*)    Alkaline Phosphatase 36 (*)    GFR, Estimated 48 (*)    All other components within normal limits    EKG EKG Interpretation  Date/Time:  Wednesday December 04 2021 15:24:09 EDT Ventricular Rate:  69 PR Interval:  223 QRS Duration: 123 QT Interval:  399 QTC Calculation: 428 R Axis:   -4 Text Interpretation: Sinus rhythm Prolonged PR interval Nonspecific intraventricular conduction delay Confirmed by Godfrey Pick [phone]) on 12/04/2021 4:47:22 PM  Radiology CT Head Wo Contrast  Result Date: 12/04/2021 CLINICAL DATA:  Moderate to severe head trauma EXAM: CT HEAD WITHOUT CONTRAST CT CERVICAL SPINE WITHOUT CONTRAST TECHNIQUE: Multidetector CT imaging of the head and cervical spine was performed following the standard protocol without intravenous contrast. Multiplanar CT image reconstructions of the cervical spine were also generated. RADIATION DOSE REDUCTION: This exam was performed according to the departmental dose-optimization program which includes automated exposure control, adjustment of the mA and/or kV according to patient size and/or use of iterative reconstruction technique. COMPARISON:  None Available. FINDINGS: CT HEAD FINDINGS Brain: No acute intracranial hemorrhage. No focal mass lesion. No CT evidence of acute infarction. No midline shift or mass effect. No hydrocephalus. Basilar cisterns are patent. There are periventricular and subcortical white matter hypodensities. Generalized cortical atrophy. Vascular: No hyperdense vessel or unexpected calcification. Skull: Normal. Negative for fracture or focal  lesion. Sinuses/Orbits: Paranasal sinuses and mastoid air cells are clear. Orbits are clear. Other: None. CT CERVICAL SPINE FINDINGS Alignment: Normal alignment of the cervical vertebral bodies. Skull base and vertebrae: Normal craniocervical junction. No loss of vertebral body height or disc height. Normal facet articulation. No evidence of fracture. Soft tissues and spinal canal: No prevertebral soft tissue swelling. No perispinal or epidural hematoma. Disc levels: There is endplate spurring and disc space narrowing at C5-C6 and C6-C7. No acute findings Upper chest: Clear Other: None IMPRESSION: 1. No intracranial trauma. 2. 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 3. No cervical spine fracture. 4. Moderate disc osteophytic disease. Electronically Signed   By: Suzy Bouchard M.D.   On: 12/04/2021 16:18   CT Cervical Spine Wo Contrast  Result Date: 12/04/2021 CLINICAL DATA:  Moderate to severe head trauma EXAM: CT HEAD WITHOUT CONTRAST CT CERVICAL SPINE WITHOUT CONTRAST TECHNIQUE: Multidetector CT imaging of the head and cervical spine was performed following the standard protocol without intravenous contrast. Multiplanar CT image reconstructions of the cervical spine were also generated. RADIATION DOSE REDUCTION: This exam was performed according to the departmental dose-optimization program which includes automated exposure control, adjustment of the mA and/or kV according to patient size and/or use of iterative reconstruction technique. COMPARISON:  None Available. FINDINGS: CT HEAD FINDINGS Brain: No acute intracranial hemorrhage. No focal mass lesion. No CT evidence of acute infarction. No midline shift or mass effect. No hydrocephalus. Basilar cisterns are patent. There are periventricular and subcortical white matter hypodensities. Generalized cortical atrophy. Vascular: No hyperdense vessel or unexpected calcification. Skull: Normal. Negative for fracture or focal lesion.  Sinuses/Orbits: Paranasal sinuses and mastoid air cells are clear. Orbits are clear. Other: None. CT CERVICAL SPINE FINDINGS Alignment: Normal alignment of the cervical vertebral bodies. Skull base and vertebrae: Normal craniocervical junction. No loss of vertebral body height or disc height. Normal facet articulation. No evidence of fracture. Soft tissues and spinal canal: No prevertebral soft tissue swelling. No perispinal  or epidural hematoma. Disc levels: There is endplate spurring and disc space narrowing at C5-C6 and C6-C7. No acute findings Upper chest: Clear Other: None IMPRESSION: 1. No intracranial trauma. 2. 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 3. No cervical spine fracture. 4. Moderate disc osteophytic disease. Electronically Signed   By: Suzy Bouchard M.D.   On: 12/04/2021 16:18    Procedures Procedures    Medications Ordered in ED Medications  sodium chloride 0.9 % bolus 500 mL (500 mLs Intravenous New Bag/Given 12/04/21 1702)    ED Course/ Medical Decision Making/ A&P                           Medical Decision Making Amount and/or Complexity of Data Reviewed Labs: ordered. Radiology: ordered.  Risk Prescription drug management.   This patient presents to the ED for concern of syncope, this involves an extensive number of treatment options, and is a complaint that carries with it a high risk of complications and morbidity.   Co morbidities that complicate the patient evaluation  Hx htn, T2DM, CKD   Additional history obtained:  Additional history obtained from epic chart review   Lab Tests:  I Ordered, and personally interpreted labs.  The pertinent results include:  Glucose 141, BUN 30, creatinine 1.48 consistent with baseline. Hgb 11.9 consistent with previous. No other acute laboratory findings   Imaging Studies ordered:  I ordered imaging studies including CT head, CT cervical spine, CTA head and neck I independently visualized and  interpreted imaging which showed  CT: no acute findings I agree with the radiologist interpretation   Cardiac Monitoring: / EKG:  The patient was maintained on a cardiac monitor.  I personally viewed and interpreted the cardiac monitored which showed an underlying rhythm of: no STEMI   Problem List / ED Course / Critical interventions / Medication management  I ordered medication including fluids for dehydration  Reevaluation of the patient after these medicines showed that the patient improved I have reviewed the patients home medicines and have made adjustments as needed   Test / Admission - Considered:  Patient presents today with complaints of fall. Initially, the patient stated that he thought it was the fall that caused him to loose consciousness, however upon secondary conversation he states he is actually unsure if a syncopal episode is what cause him to fall. He is afebrile, non-toxic appearing, and in no acute distress with reassuring vital signs. He is also alert and oriented and neurologically intact without focal deficits. Laboratory evaluation reveals no acute findings. Orthostatic vital signs unremarkable. However, when patient got up to stand he did state that he felt very unsteady on his feet and was unable to ambulate without assistance which is new for him. Patient spoke with my attending Dr. Doren Custard and CTA head and neck was ordered for further evaluation of his symptoms. This is pending at shift change. Will need to be reassessed after this results, if CT is normal, patient will likely need MRI brain for further evaluation.   Care handoff to Dr. Doren Custard at shift change. Please see their note for further evaluation and dispo.   This is a shared visit with supervising physician Dr. Doren Custard who has independently evaluated patient & provided guidance in evaluation/management/disposition, in agreement with care    Final Clinical Impression(s) / ED Diagnoses Final diagnoses:   None    Rx / DC Orders ED Discharge Orders     None  Nestor Lewandowsky 12/05/21 2226    Godfrey Pick, MD 12/07/21 [phone]

## 2021-12-05 NOTE — ED Provider Notes (Signed)
Patient seen in conjunction with PA Smoot.  This patient presented to the ED after a fall down some steps.  He describes the fall as falling backwards and striking the back of his head.  It does seem that he had a loss of consciousness but it is unclear if this occurred prior to or after the fall.  Currently, patient is awaiting CTA imaging. Physical Exam  BP (!) 154/77   Pulse 76   Temp 98.4 F (36.9 C) (Oral)   Resp 18   Ht '5\' 11"'$  (1.803 m)   Wt 87.1 kg   SpO2 98%   BMI 26.78 kg/m   Physical Exam Vitals and nursing note reviewed.  Constitutional:      General: He is not in acute distress.    Appearance: Normal appearance. He is well-developed and normal weight. He is not ill-appearing, toxic-appearing or diaphoretic.  HENT:     Head: Normocephalic.     Right Ear: External ear normal.     Left Ear: External ear normal.     Nose: Nose normal.     Mouth/Throat:     Mouth: Mucous membranes are moist.     Pharynx: Oropharynx is clear.  Eyes:     Extraocular Movements: Extraocular movements intact.     Conjunctiva/sclera: Conjunctivae normal.  Cardiovascular:     Rate and Rhythm: Normal rate and regular rhythm.  Pulmonary:     Effort: Pulmonary effort is normal. No respiratory distress.  Abdominal:     General: There is no distension.     Palpations: Abdomen is soft.  Musculoskeletal:        General: No swelling.     Cervical back: Normal range of motion and neck supple.  Skin:    General: Skin is warm and dry.  Neurological:     General: No focal deficit present.     Mental Status: He is alert and oriented to person, place, and time.  Psychiatric:        Mood and Affect: Mood normal.        Behavior: Behavior normal.     Procedures  Procedures  ED Course / MDM    Medical Decision Making Amount and/or Complexity of Data Reviewed Labs: ordered. Radiology: ordered.  Risk Prescription drug management.   On my initial assessment, patient was resting  comfortably.  He denies any symptoms at rest, including any current headache.  The initial CT scan of his head was negative for fracture or ICH.  Based on the patient's description of the mechanism, I do suspect that he sustained a concussion.  It is unclear if he has retrograde amnesia or if he had a syncopal episode without a prodrome.  He does remember walking up the steps.  The next thing he knew, he was on the ground.  Patient underwent echocardiogram 3 weeks ago.  On bedside cardiac monitor data here in the ED, he has no evidence of arrhythmia.  His lab work is reassuring.  I had a shared decision-making discussion with the patient.  Patient stated that he would prefer to go home but is agreeable to further work-up in the ED.  A CTA of head and neck was ordered.  When nursing stood up the patient, he was unsteady on his feet.  When I talked to him about this, he states that this was primarily due to ongoing, chronic pain in his hip.  I stood him up at bedside.  He described a mild dizziness.  This could  be secondary to his concussion.  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  I did discuss this with neurologist on-call, who recommended obtaining MRI.  In terms of his right vertebral artery stenosis, there is no acute management for this.  She does not feel that this would have contributed to his episode of a possible syncope.  Patient was agreeable to MRI.  MRI was ordered.  Results showed no acute findings.  On further reassessment, patient continues to rest comfortably.  He was informed of reassuring MRI results and is comfortable with discharge home.  He was given instructions on management of postconcussive syndrome.  He was advised to follow-up with his outpatient neurologist and to return to the ED for any new or worsening symptoms of concern.  He was discharged in stable condition.       Godfrey Pick, MD 12/05/21 [phone]

## 2021-12-17 ENCOUNTER — Ambulatory Visit (INDEPENDENT_AMBULATORY_CARE_PROVIDER_SITE_OTHER): Payer: Medicare Other | Admitting: Physician Assistant

## 2021-12-17 ENCOUNTER — Encounter: Payer: Self-pay | Admitting: Physician Assistant

## 2021-12-17 VITALS — BP 138/74 | HR 78 | Ht 72.0 in | Wt 191.0 lb

## 2021-12-17 DIAGNOSIS — K219 Gastro-esophageal reflux disease without esophagitis: Secondary | ICD-10-CM

## 2021-12-17 DIAGNOSIS — R1013 Epigastric pain: Secondary | ICD-10-CM

## 2021-12-17 MED ORDER — OMEPRAZOLE 20 MG PO CPDR
20.0000 mg | DELAYED_RELEASE_CAPSULE | Freq: Two times a day (BID) | ORAL | 2 refills | Status: DC
Start: 2021-12-17 — End: 2022-01-08

## 2021-12-17 NOTE — Progress Notes (Signed)
Chief Complaint: Follow-up weight loss, black stool and abdominal pain  HPI:    Christian Ross is a 81 year old Caucasian male with a past medical history as listed below, known to Dr. Carlean Purl, who returns to clinic today for follow-up of weight loss, black stool and abdominal pain.     06/16/2018 colonoscopy for surveillance given patient's history of adenomatous polyps with diverticulosis in the sigmoid colon otherwise normal.  No repeat due to age.    08/22/2021 right total hip arthroplasty.    10/03/2021 patient seen in the ER for chest pain.  EKG normal.  Imaging normal.  CBC with a hemoglobin of 12.1 (13.33 months ago).    10/13/2021 CT of the abdomen without contrast for epigastric pain for the past 2 weeks with no acute abdominal findings, mass lesions in the or adenopathy.    10/18/2021 patient seen by cardiology in that time describes some stomach issues.  He was noted to have a soft systolic murmur and an echo was ordered.  This was not completed yet.    11/13/2021 patient seen by me and described pain under his sternum typically when he was sleeping or he would wake up in the morning with it 2 times a week, Pepcid helped.  He had recently started Omeprazole 20 mg once a day.  Also describes some black stools which correlated with Pepto-Bismol use.  Described weight loss but he was trying to lose weight.  At that time advised him not to eat snacks late at night.  Also started him on Omeprazole 40 mg twice a day.  Discussed conservative management with him given his age and increased risk for anesthesia as well as the fact that he is being worked up for new heart murmur.  Discussed doing an EGD if he continues with symptoms.    11/15/2021 echo with mild grade 1 diastolic dysfunction and trivial mitral regurg as well as mild thickening of the aortic valve.  EF 61%.    12/04/2021 patient seen in the ED after falling down the stars and losing consciousness.  He had a CTA which showed severe left and mild  right vertebral artery segment stenosis and mild bilateral carotid bifurcation atherosclerosis.  MRI of the brain without contrast showed no acute intracranial abnormality.  Findings of chronic small vessel ischemia.  At that time advised to follow-up with his outpatient neurologist.  Labs with a CBC showing hemoglobin 11.9.  CMP with a creatinine of 1.48 (around his baseline) mild elevation in AST of 45 and alk phos low at 36.    Today, the patient tells me that he is doing much better from a GI standpoint.  He is no longer having any abdominal pain.  Tells me that he used the Omeprazole 40 mg twice a day for a few weeks and then decided he would try 20 mg twice a day and tells me that he noticed no change when he was using the lower dose.  Symptoms are still well controlled.  He then ran out of his 20 mg and went back to the 40 mg twice a day for the past few weeks.    Denies fever, chills, continued black stools, nausea or vomiting.   Past Medical History:  Diagnosis Date   Allergy    Aortic atherosclerosis ([place])    Arthritis    Cancer ([place])    skin pre cancer nose   Cough 08/2014   new x36mo denies fever and shortness of breath   Diabetes mellitus  without complication ([place])    type 2    Diverticulitis    Erectile dysfunction    Hemorrhoids    History of skin cancer    basal cell removed left arm 09/07/14   Hx of colonic polyps    Hyperlipidemia    Hypertension    orthostatic   Hypertriglyceridemia    Kidney stones    Rotator cuff tear    left   Sleep apnea    cpap setting at 4    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 Jannifer Franklin   Vitamin D deficiency     Past Surgical History:  Procedure Laterality Date   COLONOSCOPY W/ BIOPSIES AND POLYPECTOMY  multiple, 2005, 08, [place]   right / for [place] ARTHROSCOPY WITH ROTATOR CUFF REPAIR AND SUBACROMIAL DECOMPRESSION Right 12/14/2019   Procedure:  Right shoulder mini open rotator cuff repair, subacromial decompression,;  Surgeon: Susa Day, MD;  Location: [facility];  Service: Orthopedics;  Laterality: Right;  2 hrs Choice with Block   SHOULDER OPEN ROTATOR CUFF REPAIR Left 09/14/2014   Procedure: LEFT SHOULDER MINI OPEN ROTATOR CUFF REPAIR AND SUBACROMIAL DECOMPRESSION ;  Surgeon: Susa Day, MD;  Location: [facility];  Service: Orthopedics;  Laterality: Left;   TOTAL HIP ARTHROPLASTY Right 08/22/2021   Procedure: TOTAL HIP ARTHROPLASTY ANTERIOR APPROACH;  Surgeon: Rod Can, MD;  Location: [facility];  Service: Orthopedics;  Laterality: Right;  150    Current Outpatient Medications  Medication Sig Dispense Refill   Ascorbic Acid (VITAMIN C) 1000 MG tablet Take 1,000 mg by mouth daily.     aspirin 81 MG chewable tablet Chew 1 tablet by mouth in the morning.     Berberine Chloride POWD Take 800 mg by mouth every morning. 2 caps every morning     fenofibrate (TRICOR) 145 MG tablet Take 1 tablet (145 mg total) by mouth daily. 90 tablet 3   glipiZIDE (GLUCOTROL XL) 10 MG 24 hr tablet Take 10 mg by mouth daily with breakfast.      Glucosamine-Chondroit-Vit C-Mn (GLUCOSAMINE 1500 COMPLEX) CAPS Take 2 tablets by mouth daily.     icosapent Ethyl (VASCEPA) 1 g capsule Take 2 capsules (2 g total) by mouth 2 (two) times daily. 360 capsule 3   lamoTRIgine (LAMICTAL) 25 MG tablet Take 3 tablets (75 mg total) by mouth 2 (two) times daily. 540 tablet 3   Lecithin 1200 MG CAPS Take 2,400 mg by mouth every morning.      metoprolol tartrate (LOPRESSOR) 25 MG tablet TAKE 1 TABLET TWICE A DAY 180 tablet 3   omeprazole (PRILOSEC) 40 MG capsule Take 1 capsule (40 mg total) by mouth 2 (two) times daily before a meal. 60 capsule 2   ondansetron (ZOFRAN-ODT) 4 MG disintegrating tablet Take 1 tablet (4 mg total) by mouth every 8 (eight) hours as needed for nausea or vomiting. 12 tablet 0   OVER THE COUNTER MEDICATION Take 2 capsules by mouth every morning. Cool  [employer], Serenoa repens, (SAW PALMETTO PO) Take 2 capsules by mouth in the morning and at bedtime.      topiramate (TOPAMAX) 50 MG tablet Take 1 tablet (50 mg total) by mouth 2 (two) times daily. 180 tablet 3   TURMERIC PO Take 2 capsules by mouth in the morning and at bedtime.  Ubiquinol 200 MG CAPS Take 400 mg by mouth in the morning and at bedtime.      No current facility-administered medications for this visit.    Allergies as of 12/17/2021 - Review Complete 12/04/2021  Allergen Reaction Noted   Lisinopril Cough 10/31/2010    Family History  Problem Relation Age of Onset   Colon cancer Neg Hx    Colon polyps Neg Hx    Esophageal cancer Neg Hx    Rectal cancer Neg Hx    Stomach cancer Neg Hx     Social History   Socioeconomic History   Marital status: Married    Spouse name: Not on file   Number of children: 2   Years of education: college   Highest education level: Not on file  Occupational History   Occupation: Futures trader: RETIRED  Tobacco Use   Smoking status: Never   Smokeless tobacco: Never  Vaping Use   Vaping Use: Never used  Substance and Sexual Activity   Alcohol use: Yes    Comment: rarely   Drug use: No   Sexual activity: Never  Other Topics Concern   Not on file  Social History Narrative   Lives at home w/ wife   Patient drinks a couple of sodas a week.   Patient is right handed.    Social Determinants of Health   Financial Resource Strain: Not on file  Food Insecurity: Not on file  Transportation Needs: Not on file  Physical Activity: Not on file  Stress: Not on file  Social Connections: Not on file  Intimate Partner Violence: Not on file    Review of Systems:    Constitutional: No weight loss, fever or chills Cardiovascular: No chest pain  Respiratory: No SOB  Gastrointestinal: See HPI and otherwise negative   Physical Exam:  Vital signs: BP 138/74   Pulse 78   Ht 6' (1.829 m)   Wt 191 lb (86.6 kg)    BMI 25.90 kg/m    Constitutional:   Pleasant Elderly Caucasian male appears to be in NAD, Well developed, Well nourished, alert and cooperative Respiratory: Respirations even and unlabored. Lungs clear to auscultation bilaterally.   No wheezes, crackles, or rhonchi.  Cardiovascular: Normal S1, S2. No MRG. Regular rate and rhythm. No peripheral edema, cyanosis or pallor.  Gastrointestinal:  Soft, nondistended, nontender. No rebound or guarding. Normal bowel sounds. No appreciable masses or hepatomegaly. Rectal:  Not performed.  Psychiatric: Oriented to person, place and time. Demonstrates good judgement and reason without abnormal affect or behaviors.  RELEVANT LABS AND IMAGING: CBC    Component Value Date/Time   WBC 5.7 12/04/2021 1445   RBC 3.80 (L) 12/04/2021 1445   HGB 11.9 (L) 12/04/2021 1445   HCT 36.4 (L) 12/04/2021 1445   PLT 263 12/04/2021 1445   MCV 95.8 12/04/2021 1445   MCH 31.3 12/04/2021 1445   MCHC 32.7 12/04/2021 1445   RDW 14.8 12/04/2021 1445   LYMPHSABS 2.5 10/15/2010 2025   MONOABS 1.3 (H) 10/15/2010 2025   EOSABS 0.3 10/15/2010 2025   BASOSABS 0.0 10/15/2010 2025    CMP     Component Value Date/Time   NA 141 12/04/2021 1445   NA 140 06/12/2020 0806   K 4.4 12/04/2021 1445   CL 109 12/04/2021 1445   CO2 23 12/04/2021 1445   GLUCOSE 141 (H) 12/04/2021 1445   BUN 30 (H) 12/04/2021 1445   BUN 23 06/12/2020 0806   CREATININE 1.48 (H)  12/04/2021 1445   CALCIUM 9.9 12/04/2021 1445   PROT 7.4 12/04/2021 1445   PROT 7.3 06/12/2020 0806   ALBUMIN 3.8 12/04/2021 1445   ALBUMIN 4.4 06/12/2020 0806   AST 45 (H) 12/04/2021 1445   ALT 29 12/04/2021 1445   ALKPHOS 36 (L) 12/04/2021 1445   BILITOT 0.5 12/04/2021 1445   BILITOT 0.4 06/12/2020 0806   GFRNONAA 48 (L) 12/04/2021 1445   GFRAA 57 (L) 06/12/2020 0806    Assessment: 1.  Epigastric pain: No more pain after increasing Omeprazole to 40 twice a day, symptoms remain controlled on Omeprazole 20 mg twice  a day; likely gastritis  Plan: 1.  At this time would recommend he decrease dosage to Omeprazole 20 mg twice a day for the next 2 weeks and then decrease down to 20 mg once in the morning every day.  Sent in a prescription #60 with 1 refill.  He will then call and let me know how he is doing so we can refill his prescription accordingly. 2.  Discussed with patient that if he is doing well after another month on the once daily dosing he could even trial coming off of this medication in the future. 3.  Patient to follow with [place] as needed.  Ellouise Newer, PA-C [facility] 12/17/2021, 1:38 PM

## 2021-12-17 NOTE — Patient Instructions (Signed)
We have sent the following medications to your pharmacy for you to pick up at your convenience: Omeprazole 20 mg twice daily for 2 weeks, then decrease to once daily.   Call or send MyChart message in 4-6 weeks with an update ask for [place].   _______________________________________________________  If you are age 79 or older, your body mass index should be between 23-30. Your Body mass index is 25.9 kg/m. If this is out of the aforementioned range listed, please consider follow up with your Primary Care Provider.  If you are age 22 or younger, your body mass index should be between 19-25. Your Body mass index is 25.9 kg/m. If this is out of the aformentioned range listed, please consider follow up with your Primary Care Provider.   ________________________________________________________  The [facility] providers would like to encourage you to use [facility] to communicate with providers for non-urgent requests or questions.  Due to long hold times on the telephone, sending your provider a message by [facility] may be a faster and more efficient way to get a response.  Please allow 48 business hours for a response.  Please remember that this is for non-urgent requests.  _______________________________________________________

## 2022-01-01 DIAGNOSIS — E785 Hyperlipidemia, unspecified: Secondary | ICD-10-CM | POA: Diagnosis not present

## 2022-01-01 DIAGNOSIS — E1169 Type 2 diabetes mellitus with other specified complication: Secondary | ICD-10-CM | POA: Diagnosis not present

## 2022-01-01 DIAGNOSIS — I10 Essential (primary) hypertension: Secondary | ICD-10-CM | POA: Diagnosis not present

## 2022-01-01 DIAGNOSIS — N183 Chronic kidney disease, stage 3 unspecified: Secondary | ICD-10-CM | POA: Diagnosis not present

## 2022-01-01 DIAGNOSIS — R2689 Other abnormalities of gait and mobility: Secondary | ICD-10-CM | POA: Diagnosis not present

## 2022-01-01 DIAGNOSIS — G44059 Short lasting unilateral neuralgiform headache with conjunctival injection and tearing (SUNCT), not intractable: Secondary | ICD-10-CM | POA: Diagnosis not present

## 2022-01-06 ENCOUNTER — Telehealth: Payer: Self-pay | Admitting: Physician Assistant

## 2022-01-06 NOTE — Telephone Encounter (Signed)
Patient called states he is doing better if the Omeprazole 20 mg medication. Thank you

## 2022-01-08 MED ORDER — OMEPRAZOLE 20 MG PO CPDR: 20.0000 mg | DELAYED_RELEASE_CAPSULE | Freq: Every day | ORAL | 3 refills | Status: AC

## 2022-01-08 NOTE — Telephone Encounter (Signed)
Called and spoke with patient. He would like a 90-day supply of Omeprazole 20 mg sent to [facility]. Pt will decrease to 1 capsule daily. Pt verbalized understanding and had no concerns at the end of the call.

## 2022-01-30 DIAGNOSIS — R2689 Other abnormalities of gait and mobility: Secondary | ICD-10-CM | POA: Diagnosis not present

## 2022-01-30 DIAGNOSIS — M6281 Muscle weakness (generalized): Secondary | ICD-10-CM | POA: Diagnosis not present

## 2022-01-30 DIAGNOSIS — H818X9 Other disorders of vestibular function, unspecified ear: Secondary | ICD-10-CM | POA: Diagnosis not present

## 2022-02-05 ENCOUNTER — Ambulatory Visit (INDEPENDENT_AMBULATORY_CARE_PROVIDER_SITE_OTHER): Payer: Medicare Other | Admitting: Neurology

## 2022-02-05 ENCOUNTER — Encounter: Payer: Self-pay | Admitting: Neurology

## 2022-02-05 VITALS — BP 142/78 | HR 66 | Ht 72.0 in | Wt 200.0 lb

## 2022-02-05 DIAGNOSIS — G44059 Short lasting unilateral neuralgiform headache with conjunctival injection and tearing (SUNCT), not intractable: Secondary | ICD-10-CM

## 2022-02-05 MED ORDER — TOPIRAMATE 50 MG PO TABS: 50.0000 mg | ORAL_TABLET | Freq: Two times a day (BID) | ORAL | 3 refills | Status: DC

## 2022-02-05 MED ORDER — LAMOTRIGINE 25 MG PO TABS: 50.0000 mg | ORAL_TABLET | Freq: Two times a day (BID) | ORAL | 3 refills | Status: DC

## 2022-02-05 NOTE — Progress Notes (Signed)
Patient: Christian Ross Date of Birth: 03/15/1941  Reason for Visit: Follow up for left SUNCT headache  History from: Patient Primary Neurologist: Dr. Billey Gosling   ASSESSMENT AND PLAN 81 y.o. year old male   Left sided SUNCT headache  -Pain remains under excellent control -Try to reduce Lamictal to 50 mg twice a day -Continue Topamax 50 mg twice a day -Call if headache pain increases, otherwise follow-up in 6 months or sooner if needed  HISTORY OF PRESENT ILLNESS: Today 02/05/22 Christian Ross is here today for follow-up.  Was last seen in April 2023, Lamictal was reduced to 75 mg twice daily.  He remains on this along with Topamax 50 mg twice daily.  Had a fall back in August, went to the ER, had some vertigo but now resolved.  MRI of the brain showed no acute abnormality.  Creatinine 1.48, AST 45, glucose 141, Hgb 11.9. Has not had any further headaches. He is doing very well. Is willing to reduce the Lamictal further. Is doing some PT for balance, only 1 session since the fall. He drove here today, lives with his wife.   MRI of the brain  12/04/21 IMPRESSION: 1. No acute intracranial abnormality. 2. Findings of chronic small vessel ischemia.  Update 08/07/21 SS: Christian Ross here today for follow-up.  On Lamictal and Topamax. Taking Lamictal 100 mg twice daily, Topamax 50 mg twice daily. Having hardly any pain at all, he has to search for it. Doesn't have any side effect from medications. Having right hip replacement in 2 weeks. PCP follows labs.   HISTORY  03/13/2021 Dr. Billey Gosling: Returns today because twinges are returning. They are triggered by touching his face, or winking with his left eye. This causes a quick electrical shock over his left eye lasting a second at a time. This is worst in the morning when he first gets up. No lacrimation or redness of his left eye (had this previously with more prolonged episodes).   He increased Topamax to 50 mg BID yesterday and hasn't noticed  much of a difference yet. Is taking Lamictal 50 mg BID.  REVIEW OF SYSTEMS: Out of a complete 14 system review of symptoms, the patient complains only of the following symptoms, and all other reviewed systems are negative.  See HPI  ALLERGIES: Allergies  Allergen Reactions   Lisinopril Cough    HOME MEDICATIONS: Outpatient Medications Prior to Visit  Medication Sig Dispense Refill   Ascorbic Acid (VITAMIN C) 1000 MG tablet Take 1,000 mg by mouth daily.     aspirin 81 MG chewable tablet Chew 1 tablet by mouth in the morning.     Berberine Chloride POWD Take 800 mg by mouth every morning. 2 caps every morning     fenofibrate (TRICOR) 145 MG tablet Take 1 tablet (145 mg total) by mouth daily. 90 tablet 3   glipiZIDE (GLUCOTROL XL) 10 MG 24 hr tablet Take 10 mg by mouth daily with breakfast.      Glucosamine-Chondroit-Vit C-Mn (GLUCOSAMINE 1500 COMPLEX) CAPS Take 2 tablets by mouth daily.     icosapent Ethyl (VASCEPA) 1 g capsule Take 2 capsules (2 g total) by mouth 2 (two) times daily. 360 capsule 3   Lecithin 1200 MG CAPS Take 2,400 mg by mouth every morning.      metoprolol tartrate (LOPRESSOR) 25 MG tablet TAKE 1 TABLET TWICE A DAY 180 tablet 3   omeprazole (PRILOSEC) 20 MG capsule Take 1 capsule (20 mg total) by mouth daily. [place]  minutes before breakfast 90 capsule 3   OVER THE COUNTER MEDICATION Take 2 capsules by mouth every morning. Cool [employer], Serenoa repens, (SAW PALMETTO PO) Take 2 capsules by mouth in the morning and at bedtime.      TURMERIC PO Take 2 capsules by mouth in the morning and at bedtime.     Ubiquinol 200 MG CAPS Take 400 mg by mouth in the morning and at bedtime.      lamoTRIgine (LAMICTAL) 25 MG tablet Take 3 tablets (75 mg total) by mouth 2 (two) times daily. 540 tablet 3   topiramate (TOPAMAX) 50 MG tablet Take 1 tablet (50 mg total) by mouth 2 (two) times daily. 180 tablet 3   ondansetron (ZOFRAN-ODT) 4 MG disintegrating tablet Take 1 tablet  (4 mg total) by mouth every 8 (eight) hours as needed for nausea or vomiting. 12 tablet 0   No facility-administered medications prior to visit.    PAST MEDICAL HISTORY: Past Medical History:  Diagnosis Date   Allergy    Aortic atherosclerosis ([place])    Arthritis    Cancer ([place])    skin pre cancer nose   Cough 08/2014   new x57mo denies fever and shortness of breath   Diabetes mellitus without complication ([facility])    type 2    Diverticulitis    Erectile dysfunction    Hemorrhoids    History of skin cancer    basal cell removed left arm 09/07/14   Hx of colonic polyps    Hyperlipidemia    Hypertension    orthostatic   Hypertriglyceridemia    Kidney stones    Rotator cuff tear    left   Sleep apnea    cpap setting at 4    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 WJannifer Franklin  Vitamin D deficiency     PAST SURGICAL HISTORY: Past Surgical History:  Procedure Laterality Date   COLONOSCOPY W/ BIOPSIES AND POLYPECTOMY  multiple, 2005, 08, [place]  [place] WITH ROT[place] CUFF REPAIR AND SUBACROMIAL DECOMPRESSION Right 12/14/2019   Procedure: Right shoulder mini open rotator cuff repair, subacromial decompression,;  Surgeon: BSusa Day MD;  [facility]: [facility];  Service: Orthopedics;  Laterality: Right;  2 hrs Choice with Block   SHOULDER OPEN ROTATOR CUFF REPAIR Left 09/14/2014   Procedure: LEFT SHOULDER MINI OPEN ROTATOR CUFF REPAIR AND SUBACROMIAL DECOMPRESSION ;  Surgeon: JSusa Day MD;  Location: [facility];  Service: Orthopedics;  Laterality: Left;   TOTAL HIP ARTHROPLASTY Right 08/22/2021   Procedure: TOTAL HIP ARTHROPLASTY ANTERIOR APPROACH;  Surgeon: SRod Can MD;  Location: [facility];  Service: Orthopedics;  Laterality: Right;  150    FAMILY HISTORY: Family History  Problem Relation Age of Onset   Colon cancer Neg Hx    Colon polyps Neg Hx    Esophageal cancer Neg  Hx    Rectal cancer Neg Hx    Stomach cancer Neg Hx     SOCIAL HISTORY: Social History   Socioeconomic History   Marital status: Married    Spouse name: Not on file   Number of children: 2   Years of education: college   Highest education level: Not on file  Occupational History   Occupation: PFutures trader RETIRED  Tobacco Use   Smoking  status: Never   Smokeless tobacco: Never  Vaping Use   Vaping Use: Never used  Substance and Sexual Activity   Alcohol use: Yes    Comment: rarely   Drug use: No   Sexual activity: Never  Other Topics Concern   Not on file  Social History Narrative   Lives at home w/ wife   Patient drinks a couple of sodas a week.   Patient is right handed.    Social Determinants of Health   Financial Resource Strain: Not on file  Food Insecurity: Not on file  Transportation Needs: Not on file  Physical Activity: Not on file  Stress: Not on file  Social Connections: Not on file  Intimate Partner Violence: Not on file   PHYSICAL EXAM  Vitals:   02/05/22 0855  BP: (!) 142/78  Pulse: 66  Weight: 200 lb (90.7 kg)  Height: 6' (1.829 m)    Body mass index is 27.12 kg/m.  Generalized: Well developed, in no acute distress  Neurological examination  Mentation: Alert oriented to time, place, history taking. Follows all commands speech and language fluent Cranial nerve II-XII: Pupils were equal round reactive to light. Extraocular movements were full, visual field were full on confrontational test. Facial sensation and strength were normal. Head turning and shoulder shrug  were normal and symmetric. Motor: The motor testing reveals 5 over 5 strength of all 4 extremities. Good symmetric motor tone is noted throughout.  Sensory: Sensory testing is intact to soft touch on all 4 extremities. No evidence of extinction is noted.  Coordination: Cerebellar testing reveals good finger-nose-finger and heel-to-shin bilaterally.  Gait and station: Gait is  slightly wide-based, independent, tandem gait is unsteady Reflexes: Deep tendon reflexes are symmetric and normal bilaterally.   DIAGNOSTIC DATA (LABS, IMAGING, TESTING) - I reviewed patient records, labs, notes, testing and imaging myself where available.  Lab Results  Component Value Date   WBC 5.7 12/04/2021   HGB 11.9 (L) 12/04/2021   HCT 36.4 (L) 12/04/2021   MCV 95.8 12/04/2021   PLT 263 12/04/2021      Component Value Date/Time   NA 141 12/04/2021 1445   NA 140 06/12/2020 0806   K 4.4 12/04/2021 1445   CL 109 12/04/2021 1445   CO2 23 12/04/2021 1445   GLUCOSE 141 (H) 12/04/2021 1445   BUN 30 (H) 12/04/2021 1445   BUN 23 06/12/2020 0806   CREATININE 1.48 (H) 12/04/2021 1445   CALCIUM 9.9 12/04/2021 1445   PROT 7.4 12/04/2021 1445   PROT 7.3 06/12/2020 0806   ALBUMIN 3.8 12/04/2021 1445   ALBUMIN 4.4 06/12/2020 0806   AST 45 (H) 12/04/2021 1445   ALT 29 12/04/2021 1445   ALKPHOS 36 (L) 12/04/2021 1445   BILITOT 0.5 12/04/2021 1445   BILITOT 0.4 06/12/2020 0806   GFRNONAA 48 (L) 12/04/2021 1445   GFRAA 57 (L) 06/12/2020 0806   Lab Results  Component Value Date   CHOL 91 (L) 09/18/2020   HDL 35 (L) 09/18/2020   LDLCALC 27 09/18/2020   LDLDIRECT 98.0 01/15/2015   TRIG 178 (H) 09/18/2020   CHOLHDL 2.6 09/18/2020   Lab Results  Component Value Date   HGBA1C 6.0 (H) 08/14/2021   No results found for: "VITAMINB12" No results found for: "TSH"  Butler Denmark, AGNP-C, DNP 02/05/2022, 9:15 AM [facility] [address], [place], [place] [address] [phone]

## 2022-02-05 NOTE — Patient Instructions (Signed)
Try to decrease your Lamictal to 50 mg twice daily, continue the Topamax 50 mg twice daily Monitor for any worsening symptoms See you back in 6 months

## 2022-02-13 DIAGNOSIS — H818X9 Other disorders of vestibular function, unspecified ear: Secondary | ICD-10-CM | POA: Diagnosis not present

## 2022-02-13 DIAGNOSIS — R2689 Other abnormalities of gait and mobility: Secondary | ICD-10-CM | POA: Diagnosis not present

## 2022-02-13 DIAGNOSIS — M6281 Muscle weakness (generalized): Secondary | ICD-10-CM | POA: Diagnosis not present

## 2022-02-20 DIAGNOSIS — R2689 Other abnormalities of gait and mobility: Secondary | ICD-10-CM | POA: Diagnosis not present

## 2022-02-20 DIAGNOSIS — M6281 Muscle weakness (generalized): Secondary | ICD-10-CM | POA: Diagnosis not present

## 2022-02-20 DIAGNOSIS — H818X9 Other disorders of vestibular function, unspecified ear: Secondary | ICD-10-CM | POA: Diagnosis not present

## 2022-03-06 DIAGNOSIS — H818X9 Other disorders of vestibular function, unspecified ear: Secondary | ICD-10-CM | POA: Diagnosis not present

## 2022-03-06 DIAGNOSIS — M6281 Muscle weakness (generalized): Secondary | ICD-10-CM | POA: Diagnosis not present

## 2022-03-06 DIAGNOSIS — R2689 Other abnormalities of gait and mobility: Secondary | ICD-10-CM | POA: Diagnosis not present

## 2022-03-10 DIAGNOSIS — G4733 Obstructive sleep apnea (adult) (pediatric): Secondary | ICD-10-CM | POA: Diagnosis not present

## 2022-03-11 DIAGNOSIS — R197 Diarrhea, unspecified: Secondary | ICD-10-CM | POA: Diagnosis not present

## 2022-03-11 DIAGNOSIS — A0472 Enterocolitis due to Clostridium difficile, not specified as recurrent: Secondary | ICD-10-CM | POA: Diagnosis not present

## 2022-03-12 DIAGNOSIS — R197 Diarrhea, unspecified: Secondary | ICD-10-CM | POA: Diagnosis not present

## 2022-03-18 NOTE — Telephone Encounter (Signed)
error 

## 2022-03-20 DIAGNOSIS — R2689 Other abnormalities of gait and mobility: Secondary | ICD-10-CM | POA: Diagnosis not present

## 2022-03-20 DIAGNOSIS — M6281 Muscle weakness (generalized): Secondary | ICD-10-CM | POA: Diagnosis not present

## 2022-03-20 DIAGNOSIS — H818X9 Other disorders of vestibular function, unspecified ear: Secondary | ICD-10-CM | POA: Diagnosis not present

## 2022-03-31 DIAGNOSIS — L578 Other skin changes due to chronic exposure to nonionizing radiation: Secondary | ICD-10-CM | POA: Diagnosis not present

## 2022-03-31 DIAGNOSIS — L309 Dermatitis, unspecified: Secondary | ICD-10-CM | POA: Diagnosis not present

## 2022-03-31 DIAGNOSIS — L821 Other seborrheic keratosis: Secondary | ICD-10-CM | POA: Diagnosis not present

## 2022-03-31 DIAGNOSIS — Z85828 Personal history of other malignant neoplasm of skin: Secondary | ICD-10-CM | POA: Diagnosis not present

## 2022-03-31 DIAGNOSIS — B353 Tinea pedis: Secondary | ICD-10-CM | POA: Diagnosis not present

## 2022-03-31 DIAGNOSIS — Z86018 Personal history of other benign neoplasm: Secondary | ICD-10-CM | POA: Diagnosis not present

## 2022-03-31 DIAGNOSIS — D225 Melanocytic nevi of trunk: Secondary | ICD-10-CM | POA: Diagnosis not present

## 2022-03-31 DIAGNOSIS — L570 Actinic keratosis: Secondary | ICD-10-CM | POA: Diagnosis not present

## 2022-03-31 DIAGNOSIS — L814 Other melanin hyperpigmentation: Secondary | ICD-10-CM | POA: Diagnosis not present

## 2022-04-03 DIAGNOSIS — H818X9 Other disorders of vestibular function, unspecified ear: Secondary | ICD-10-CM | POA: Diagnosis not present

## 2022-04-03 DIAGNOSIS — M6281 Muscle weakness (generalized): Secondary | ICD-10-CM | POA: Diagnosis not present

## 2022-04-03 DIAGNOSIS — R2689 Other abnormalities of gait and mobility: Secondary | ICD-10-CM | POA: Diagnosis not present

## 2022-04-17 DIAGNOSIS — M6281 Muscle weakness (generalized): Secondary | ICD-10-CM | POA: Diagnosis not present

## 2022-04-17 DIAGNOSIS — H818X9 Other disorders of vestibular function, unspecified ear: Secondary | ICD-10-CM | POA: Diagnosis not present

## 2022-04-17 DIAGNOSIS — R2689 Other abnormalities of gait and mobility: Secondary | ICD-10-CM | POA: Diagnosis not present

## 2022-05-01 DIAGNOSIS — H818X9 Other disorders of vestibular function, unspecified ear: Secondary | ICD-10-CM | POA: Diagnosis not present

## 2022-05-01 DIAGNOSIS — M6281 Muscle weakness (generalized): Secondary | ICD-10-CM | POA: Diagnosis not present

## 2022-05-01 DIAGNOSIS — R2689 Other abnormalities of gait and mobility: Secondary | ICD-10-CM | POA: Diagnosis not present

## 2022-05-14 IMAGING — DX DG PORTABLE PELVIS
1 series · 1 of 1 positions shown · non-contrast
Comparison: None Available.

CLINICAL DATA: Status post right hip replacement

EXAM:
PORTABLE PELVIS 1-2 VIEWS

[pelvis ap]
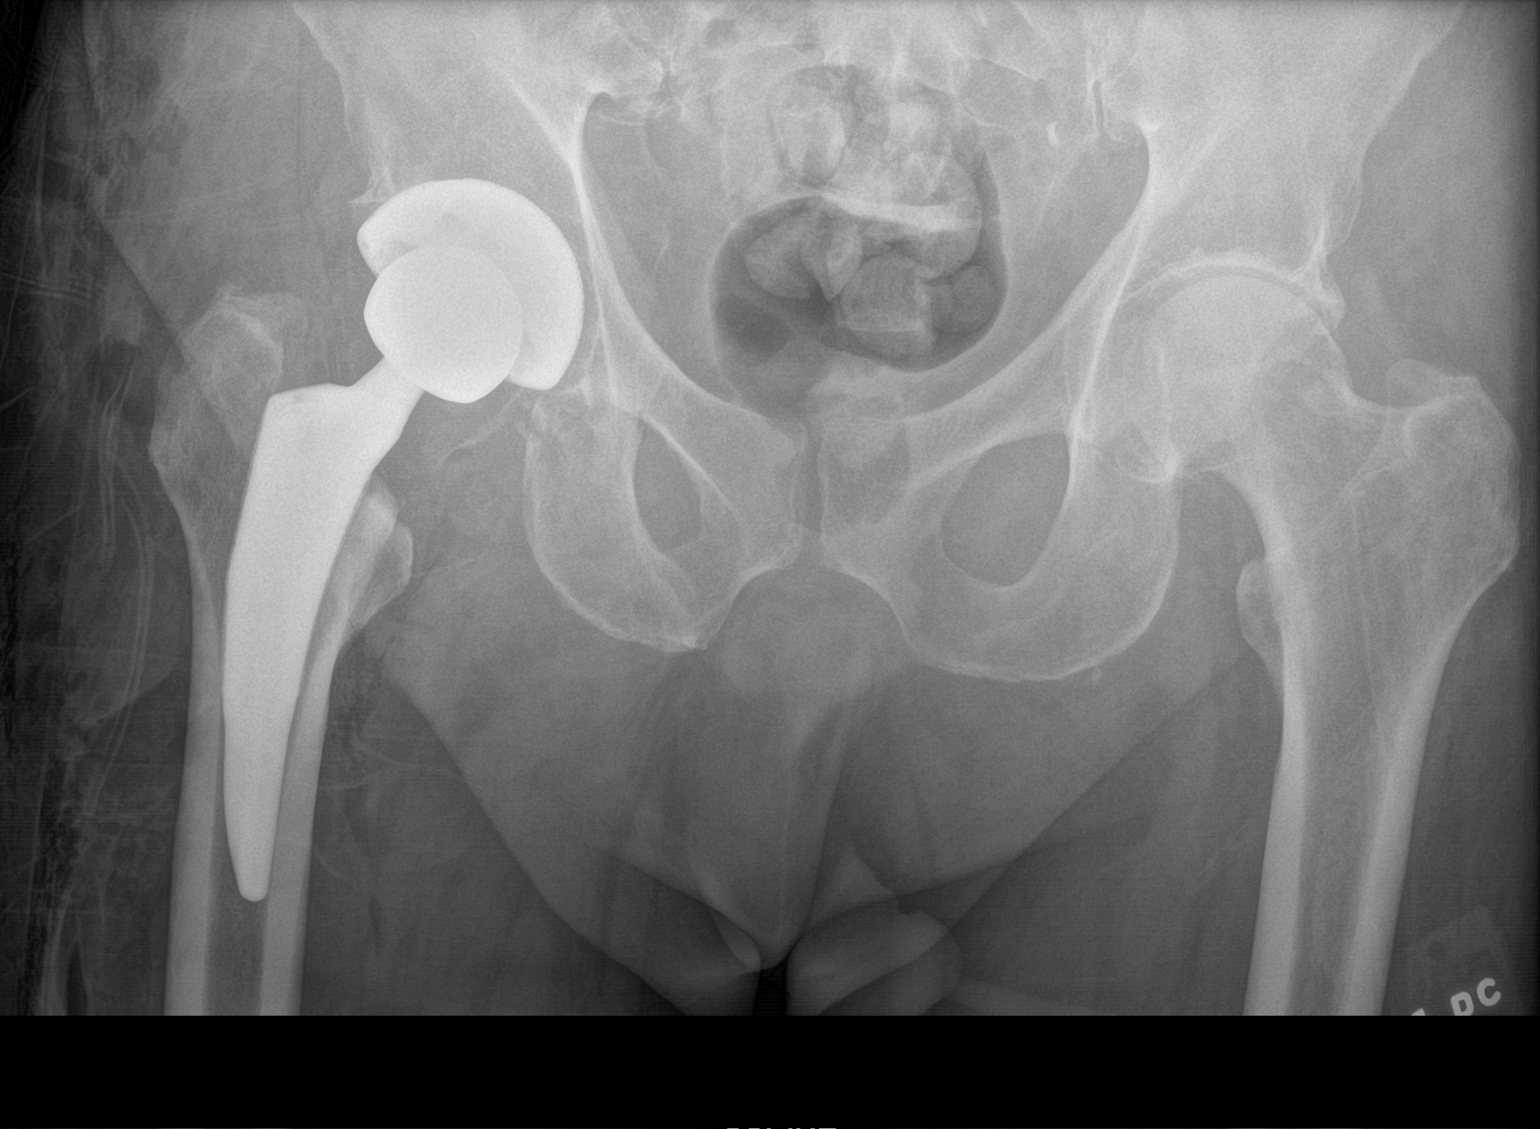

[1 of 1 positions shown; findings below may reference images not displayed]

FINDINGS: Interval right total hip arthroplasty. Postsurgical changes in the
surrounding soft tissues. Normal alignment.

No acute fracture or dislocation. Mild osteoarthritis of the left
hip.
IMPRESSION: 1. Interval right total hip arthroplasty.

## 2022-05-15 DIAGNOSIS — R2689 Other abnormalities of gait and mobility: Secondary | ICD-10-CM | POA: Diagnosis not present

## 2022-05-15 DIAGNOSIS — H818X9 Other disorders of vestibular function, unspecified ear: Secondary | ICD-10-CM | POA: Diagnosis not present

## 2022-05-15 DIAGNOSIS — M6281 Muscle weakness (generalized): Secondary | ICD-10-CM | POA: Diagnosis not present

## 2022-06-04 DIAGNOSIS — M6281 Muscle weakness (generalized): Secondary | ICD-10-CM | POA: Diagnosis not present

## 2022-06-04 DIAGNOSIS — H818X9 Other disorders of vestibular function, unspecified ear: Secondary | ICD-10-CM | POA: Diagnosis not present

## 2022-06-04 DIAGNOSIS — R2689 Other abnormalities of gait and mobility: Secondary | ICD-10-CM | POA: Diagnosis not present

## 2022-07-01 DIAGNOSIS — H35033 Hypertensive retinopathy, bilateral: Secondary | ICD-10-CM | POA: Diagnosis not present

## 2022-07-01 DIAGNOSIS — H43812 Vitreous degeneration, left eye: Secondary | ICD-10-CM | POA: Diagnosis not present

## 2022-07-01 DIAGNOSIS — H524 Presbyopia: Secondary | ICD-10-CM | POA: Diagnosis not present

## 2022-07-01 DIAGNOSIS — H52221 Regular astigmatism, right eye: Secondary | ICD-10-CM | POA: Diagnosis not present

## 2022-07-01 DIAGNOSIS — E119 Type 2 diabetes mellitus without complications: Secondary | ICD-10-CM | POA: Diagnosis not present

## 2022-07-01 DIAGNOSIS — H5201 Hypermetropia, right eye: Secondary | ICD-10-CM | POA: Diagnosis not present

## 2022-07-01 DIAGNOSIS — H1045 Other chronic allergic conjunctivitis: Secondary | ICD-10-CM | POA: Diagnosis not present

## 2022-07-01 DIAGNOSIS — H25813 Combined forms of age-related cataract, bilateral: Secondary | ICD-10-CM | POA: Diagnosis not present

## 2022-07-23 DIAGNOSIS — G44059 Short lasting unilateral neuralgiform headache with conjunctival injection and tearing (SUNCT), not intractable: Secondary | ICD-10-CM | POA: Diagnosis not present

## 2022-07-23 DIAGNOSIS — G4733 Obstructive sleep apnea (adult) (pediatric): Secondary | ICD-10-CM | POA: Diagnosis not present

## 2022-07-23 DIAGNOSIS — Z0000 Encounter for general adult medical examination without abnormal findings: Secondary | ICD-10-CM | POA: Diagnosis not present

## 2022-07-23 DIAGNOSIS — N183 Chronic kidney disease, stage 3 unspecified: Secondary | ICD-10-CM | POA: Diagnosis not present

## 2022-07-23 DIAGNOSIS — E785 Hyperlipidemia, unspecified: Secondary | ICD-10-CM | POA: Diagnosis not present

## 2022-07-23 DIAGNOSIS — E1169 Type 2 diabetes mellitus with other specified complication: Secondary | ICD-10-CM | POA: Diagnosis not present

## 2022-07-23 DIAGNOSIS — I700 Atherosclerosis of aorta: Secondary | ICD-10-CM | POA: Diagnosis not present

## 2022-07-23 DIAGNOSIS — I10 Essential (primary) hypertension: Secondary | ICD-10-CM | POA: Diagnosis not present

## 2022-08-20 NOTE — Progress Notes (Unsigned)
Patient: Christian Ross Date of Birth: 08-27-1940  Reason for Visit: Follow up for left SUNCT headache  History from: Patient Primary Neurologist: Dr. Delena Bali   ASSESSMENT AND PLAN 82 y.o. year old male   Left sided SUNCT headache  -Pain remains under excellent control -Try to reduce Lamictal to 50 mg twice a day -Continue Topamax 50 mg twice a day -Call if headache pain increases, otherwise follow-up in 6 months or sooner if needed  HISTORY OF PRESENT ILLNESS: Today 08/20/22   Update 02/05/22 SS: Christian Ross is here today for follow-up.  Was last seen in April 2023, Lamictal was reduced to 75 mg twice daily.  He remains on this along with Topamax 50 mg twice daily.  Had a fall back in August, went to the ER, had some vertigo but now resolved.  MRI of the brain showed no acute abnormality.  Creatinine 1.48, AST 45, glucose 141, Hgb 11.9. Has not had any further headaches. He is doing very well. Is willing to reduce the Lamictal further. Is doing some PT for balance, only 1 session since the fall. He drove here today, lives with his wife.   MRI of the brain  12/04/21 IMPRESSION: 1. No acute intracranial abnormality. 2. Findings of chronic small vessel ischemia.  Update 08/07/21 SS: Christian Ross here today for follow-up.  On Lamictal and Topamax. Taking Lamictal 100 mg twice daily, Topamax 50 mg twice daily. Having hardly any pain at all, he has to search for it. Doesn't have any side effect from medications. Having right hip replacement in 2 weeks. PCP follows labs.   HISTORY  03/13/2021 Dr. Delena Bali: Returns today because twinges are returning. They are triggered by touching his face, or winking with his left eye. This causes a quick electrical shock over his left eye lasting a second at a time. This is worst in the morning when he first gets up. No lacrimation or redness of his left eye (had this previously with more prolonged episodes).   He increased Topamax to 50 mg BID  yesterday and hasn't noticed much of a difference yet. Is taking Lamictal 50 mg BID.  REVIEW OF SYSTEMS: Out of a complete 14 system review of symptoms, the patient complains only of the following symptoms, and all other reviewed systems are negative.  See HPI  ALLERGIES: Allergies  Allergen Reactions   Lisinopril Cough    HOME MEDICATIONS: Outpatient Medications Prior to Visit  Medication Sig Dispense Refill   Ascorbic Acid (VITAMIN C) 1000 MG tablet Take 1,000 mg by mouth daily.     aspirin 81 MG chewable tablet Chew 1 tablet by mouth in the morning.     Berberine Chloride POWD Take 800 mg by mouth every morning. 2 caps every morning     fenofibrate (TRICOR) 145 MG tablet Take 1 tablet (145 mg total) by mouth daily. 90 tablet 3   glipiZIDE (GLUCOTROL XL) 10 MG 24 hr tablet Take 10 mg by mouth daily with breakfast.      Glucosamine-Chondroit-Vit C-Mn (GLUCOSAMINE 1500 COMPLEX) CAPS Take 2 tablets by mouth daily.     icosapent Ethyl (VASCEPA) 1 g capsule Take 2 capsules (2 g total) by mouth 2 (two) times daily. 360 capsule 3   lamoTRIgine (LAMICTAL) 25 MG tablet Take 2 tablets (50 mg total) by mouth 2 (two) times daily. 360 tablet 3   Lecithin 1200 MG CAPS Take 2,400 mg by mouth every morning.      metoprolol tartrate (LOPRESSOR) 25 MG  tablet TAKE 1 TABLET TWICE A DAY 180 tablet 3   omeprazole (PRILOSEC) 20 MG capsule Take 1 capsule (20 mg total) by mouth daily. 30 minutes before breakfast 90 capsule 3   OVER THE COUNTER MEDICATION Take 2 capsules by mouth every morning. Cool [employer], Serenoa repens, (SAW PALMETTO PO) Take 2 capsules by mouth in the morning and at bedtime.      topiramate (TOPAMAX) 50 MG tablet Take 1 tablet (50 mg total) by mouth 2 (two) times daily. 180 tablet 3   TURMERIC PO Take 2 capsules by mouth in the morning and at bedtime.     Ubiquinol 200 MG CAPS Take 400 mg by mouth in the morning and at bedtime.      No facility-administered medications  prior to visit.    PAST MEDICAL HISTORY: Past Medical History:  Diagnosis Date   Allergy    Aortic atherosclerosis (HCC)    Arthritis    Cancer (HCC)    skin pre cancer nose   Cough 08/2014   new x32mo, denies fever and shortness of breath   Diabetes mellitus without complication (HCC)    type 2    Diverticulitis    Erectile dysfunction    Hemorrhoids    History of skin cancer    basal cell removed left arm 09/07/14   Hx of colonic polyps    Hyperlipidemia    Hypertension    orthostatic   Hypertriglyceridemia    Kidney stones    Rotator cuff tear    left   Sleep apnea    cpap setting at 4    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 Anne Hahn   Vitamin D deficiency     PAST SURGICAL HISTORY: Past Surgical History:  Procedure Laterality Date   COLONOSCOPY W/ BIOPSIES AND POLYPECTOMY  multiple, 2005, 08, 14   KNEE SURGERY  1960   right / for osgood- schlatter   NASAL SINUS SURGERY     RECTAL BIOPSY     SHOULDER ARTHROSCOPY WITH ROTATOR CUFF REPAIR AND SUBACROMIAL DECOMPRESSION Right 12/14/2019   Procedure: Right shoulder mini open rotator cuff repair, subacromial decompression,;  Surgeon: Jene Every, MD;  Location: [facility];  Service: Orthopedics;  Laterality: Right;  2 hrs Choice with Block   SHOULDER OPEN ROTATOR CUFF REPAIR Left 09/14/2014   Procedure: LEFT SHOULDER MINI OPEN ROTATOR CUFF REPAIR AND SUBACROMIAL DECOMPRESSION ;  Surgeon: Jene Every, MD;  Location: [facility];  Service: Orthopedics;  Laterality: Left;   TOTAL HIP ARTHROPLASTY Right 08/22/2021   Procedure: TOTAL HIP ARTHROPLASTY ANTERIOR APPROACH;  Surgeon: Samson Frederic, MD;  Location: [facility];  Service: Orthopedics;  Laterality: Right;  150    FAMILY HISTORY: Family History  Problem Relation Age of Onset   Colon cancer Neg Hx    Colon polyps Neg Hx    Esophageal cancer Neg Hx    Rectal cancer Neg Hx    Stomach cancer Neg Hx     SOCIAL HISTORY: Social History   Socioeconomic History   Marital  status: Married    Spouse name: Not on file   Number of children: 2   Years of education: college   Highest education level: Not on file  Occupational History   Occupation: Event organiser: RETIRED  Tobacco Use   Smoking status: Never   Smokeless tobacco: Never  Vaping Use   Vaping Use: Never used  Substance and Sexual Activity   Alcohol  use: Yes    Comment: rarely   Drug use: No   Sexual activity: Never  Other Topics Concern   Not on file  Social History Narrative   Lives at home w/ wife   Patient drinks a couple of sodas a week.   Patient is right handed.    Social Determinants of Health   Financial Resource Strain: Not on file  Food Insecurity: Not on file  Transportation Needs: Not on file  Physical Activity: Not on file  Stress: Not on file  Social Connections: Not on file  Intimate Partner Violence: Not on file   PHYSICAL EXAM  There were no vitals filed for this visit.   There is no height or weight on file to calculate BMI.  Generalized: Well developed, in no acute distress  Neurological examination  Mentation: Alert oriented to time, place, history taking. Follows all commands speech and language fluent Cranial nerve II-XII: Pupils were equal round reactive to light. Extraocular movements were full, visual field were full on confrontational test. Facial sensation and strength were normal. Head turning and shoulder shrug  were normal and symmetric. Motor: The motor testing reveals 5 over 5 strength of all 4 extremities. Good symmetric motor tone is noted throughout.  Sensory: Sensory testing is intact to soft touch on all 4 extremities. No evidence of extinction is noted.  Coordination: Cerebellar testing reveals good finger-nose-finger and heel-to-shin bilaterally.  Gait and station: Gait is slightly wide-based, independent, tandem gait is unsteady Reflexes: Deep tendon reflexes are symmetric and normal bilaterally.   DIAGNOSTIC DATA (LABS, IMAGING,  TESTING) - I reviewed patient records, labs, notes, testing and imaging myself where available.  Lab Results  Component Value Date   WBC 5.7 12/04/2021   HGB 11.9 (L) 12/04/2021   HCT 36.4 (L) 12/04/2021   MCV 95.8 12/04/2021   PLT 263 12/04/2021      Component Value Date/Time   NA 141 12/04/2021 1445   NA 140 06/12/2020 0806   K 4.4 12/04/2021 1445   CL 109 12/04/2021 1445   CO2 23 12/04/2021 1445   GLUCOSE 141 (H) 12/04/2021 1445   BUN 30 (H) 12/04/2021 1445   BUN 23 06/12/2020 0806   CREATININE 1.48 (H) 12/04/2021 1445   CALCIUM 9.9 12/04/2021 1445   PROT 7.4 12/04/2021 1445   PROT 7.3 06/12/2020 0806   ALBUMIN 3.8 12/04/2021 1445   ALBUMIN 4.4 06/12/2020 0806   AST 45 (H) 12/04/2021 1445   ALT 29 12/04/2021 1445   ALKPHOS 36 (L) 12/04/2021 1445   BILITOT 0.5 12/04/2021 1445   BILITOT 0.4 06/12/2020 0806   GFRNONAA 48 (L) 12/04/2021 1445   GFRAA 57 (L) 06/12/2020 0806   Lab Results  Component Value Date   CHOL 91 (L) 09/18/2020   HDL 35 (L) 09/18/2020   LDLCALC 27 09/18/2020   LDLDIRECT 98.0 01/15/2015   TRIG 178 (H) 09/18/2020   CHOLHDL 2.6 09/18/2020   Lab Results  Component Value Date   HGBA1C 6.0 (H) 08/14/2021   No results found for: "VITAMINB12" No results found for: "TSH"  Margie Ege, AGNP-C, DNP 08/20/2022, 4:23 PM [facility] [address] [place], [place] [address] [phone]

## 2022-08-21 ENCOUNTER — Encounter: Payer: Self-pay | Admitting: Neurology

## 2022-08-21 ENCOUNTER — Ambulatory Visit (INDEPENDENT_AMBULATORY_CARE_PROVIDER_SITE_OTHER): Payer: Medicare Other | Admitting: Neurology

## 2022-08-21 VITALS — BP 150/88 | Resp 17 | Ht 71.0 in | Wt 200.0 lb

## 2022-08-21 DIAGNOSIS — G44059 Short lasting unilateral neuralgiform headache with conjunctival injection and tearing (SUNCT), not intractable: Secondary | ICD-10-CM | POA: Diagnosis not present

## 2022-08-21 MED ORDER — LAMOTRIGINE 25 MG PO TABS: 50.0000 mg | ORAL_TABLET | Freq: Two times a day (BID) | ORAL | 3 refills | Status: DC

## 2022-08-21 MED ORDER — TOPIRAMATE 50 MG PO TABS
50.0000 mg | ORAL_TABLET | Freq: Two times a day (BID) | ORAL | 3 refills | Status: DC
Start: 2022-08-21 — End: 2023-04-06

## 2022-08-21 NOTE — Patient Instructions (Addendum)
Try to reduce the Lamictal to 50 mg twice daily, continue the Topamax 50 mg twice daily  See you back in 1 year   Meds ordered this encounter  Medications   topiramate (TOPAMAX) 50 MG tablet    Sig: Take 1 tablet (50 mg total) by mouth 2 (two) times daily.    Dispense:  180 tablet    Refill:  3   lamoTRIgine (LAMICTAL) 25 MG tablet    Sig: Take 2 tablets (50 mg total) by mouth 2 (two) times daily.    Dispense:  360 tablet    Refill:  3

## 2022-09-09 DIAGNOSIS — H43812 Vitreous degeneration, left eye: Secondary | ICD-10-CM | POA: Diagnosis not present

## 2022-09-09 DIAGNOSIS — H25813 Combined forms of age-related cataract, bilateral: Secondary | ICD-10-CM | POA: Diagnosis not present

## 2022-09-09 DIAGNOSIS — H35033 Hypertensive retinopathy, bilateral: Secondary | ICD-10-CM | POA: Diagnosis not present

## 2022-09-09 DIAGNOSIS — H1045 Other chronic allergic conjunctivitis: Secondary | ICD-10-CM | POA: Diagnosis not present

## 2022-09-10 ENCOUNTER — Other Ambulatory Visit: Payer: Self-pay

## 2022-09-10 MED ORDER — FENOFIBRATE 145 MG PO TABS: 145.0000 mg | ORAL_TABLET | Freq: Every day | ORAL | 0 refills | Status: DC

## 2022-10-20 NOTE — Progress Notes (Unsigned)
Christian Ross Date of Birth  May 20, 1940             1. Hyperlipidemia 2.Hypertension  Notes prior to 2014:   Christian Ross seems to be doing well.  He continues to have elevations in his triglyceride levels.  He is tolerating the crestor now.    Sept. 15, 2014:   Still active flying, not exercising as much as he should.  No CP or dyspnea.  Sept. 24, 2015:  Christian Ross is doing well.  Has lost 10 lbs since  I last saw him.  Exercising more.  HbA1C  Has improved.   He is no longer flying because he is on Lamactil.    Sept. 26, 2016:     Doing well from a cardiac standpoint On Flomax for BPH.  No cardiac issues.   Sept. 19, 2017  Doing well from a cardiac standpoint. Had some foot issues.   Was in a boot for several months  Now has orthotic arch supports in his shoes.  Glucose has been slightly elevated.    October 20, 2016:  Christian Ross is seen today for follow up .  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 25, 2019:  No longer is flying . He is off the meds that prevented him from passing his medical  No CP or dyspnea Knows that he needs to exercise more His medical doctor is following his DM and lipids   Feb. 25, 2020: He was seen today for follow-up of his chest pain and hyperlipidemia. Has a question about Vascepa  He had recent labs drawn drawn at his primary medical office.  His last cholesterol was 213.  His HDL is 31.  The LDL is 125.  The triglyceride level is 281.  Open A1c is 7.7.  Hemoglobin is 13.2.  TSH is 3.79. Still eating lots of carbs and fats Is not walking    Feb. 24, 2021 Christian Ross is seen for follow up of his CP, HTN, HLD We started Vascepa at his last visit  Recent Trigs are still markedly elevated - 344 Chol = 188,  LDL is 101 BMP is stable  Eating more sweets than he should.  Is not exercising much   July 30, 2020: Christian Ross is seen today for follow up of his CP, HTN, HLD  No CP or dyspnea  Was just  started on Repatha   October 18, 2021 Christian Ross is seen today for follow up of his HTN, HLD Is no longer flying  New Right hip since I last saw him  Having some stomach issues ,  recurrent abdomina pain  No cp , no dyspnea  Has become deconditioned   Had some chest pressure and went to the ER on June 15,  Work up was normal .  Troponins were negative  Has a soft systolic murmur  Will get an echo   October 21, 2022  Christian Ross  is seen for follow up visit    Current Outpatient Medications on File Prior to Visit  Medication Sig Dispense Refill   Apoaequorin (PREVAGEN PO) Take 1 tablet by mouth daily.     Ascorbic Acid (VITAMIN C) 1000 MG tablet Take 1,000 mg by mouth daily.     aspirin 81 MG chewable tablet Chew 1 tablet by mouth in the morning.     Berberine Chloride POWD Take 800 mg by mouth every morning. 2 caps every morning  fenofibrate (TRICOR) 145 MG tablet Take 1 tablet (145 mg total) by mouth daily. 90 tablet 0   glipiZIDE (GLUCOTROL XL) 10 MG 24 hr tablet Take 10 mg by mouth daily with breakfast.      Glucosamine-Chondroit-Vit C-Mn (GLUCOSAMINE 1500 COMPLEX) CAPS Take 2 tablets by mouth daily.     icosapent Ethyl (VASCEPA) 1 g capsule Take 2 capsules (2 g total) by mouth 2 (two) times daily. 360 capsule 3   lamoTRIgine (LAMICTAL) 25 MG tablet Take 2 tablets (50 mg total) by mouth 2 (two) times daily. 360 tablet 3   Lecithin 1200 MG CAPS Take 2,400 mg by mouth every morning.      metoprolol tartrate (LOPRESSOR) 25 MG tablet TAKE 1 TABLET TWICE A DAY 180 tablet 3   omeprazole (PRILOSEC) 20 MG capsule Take 1 capsule (20 mg total) by mouth daily. 30 minutes before breakfast 90 capsule 3   OVER THE COUNTER MEDICATION Take 2 capsules by mouth every morning. Cool [employer]     topiramate (TOPAMAX) 50 MG tablet Take 1 tablet (50 mg total) by mouth 2 (two) times daily. 180 tablet 3   Ubiquinol 200 MG CAPS Take 400 mg by mouth in the morning and at bedtime.      Saw Palmetto, Serenoa repens,  (SAW PALMETTO PO) Take 2 capsules by mouth in the morning and at bedtime.  (Patient not taking: Reported on 10/21/2022)     [DISCONTINUED] irbesartan (AVAPRO) 150 MG tablet Take 1 tablet (150 mg total) by mouth daily. 90 tablet 3   No current facility-administered medications on file prior to visit.    Allergies  Allergen Reactions   Lisinopril Cough    Other Reaction(s): Not available    Past Medical History:  Diagnosis Date   Allergy    Aortic atherosclerosis (HCC)    Arthritis    Cancer (HCC)    skin pre cancer nose   Cough 08/2014   new x69mo, denies fever and shortness of breath   Diabetes mellitus without complication (HCC)    type 2    Diverticulitis    Erectile dysfunction    Hemorrhoids    History of skin cancer    basal cell removed left arm 09/07/14   Hx of colonic polyps    Hyperlipidemia    Hypertension    orthostatic   Hypertriglyceridemia    Kidney stones    Rotator cuff tear    left   Sleep apnea    cpap setting at 4    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 Anne Hahn   Vitamin D deficiency     Past Surgical History:  Procedure Laterality Date   COLONOSCOPY W/ BIOPSIES AND POLYPECTOMY  multiple, 2005, 08, 14   KNEE SURGERY  1960   right / for osgood- schlatter   NASAL SINUS SURGERY     RECTAL BIOPSY     SHOULDER ARTHROSCOPY WITH ROTATOR CUFF REPAIR AND SUBACROMIAL DECOMPRESSION Right 12/14/2019   Procedure: Right shoulder mini open rotator cuff repair, subacromial decompression,;  Surgeon: Jene Every, MD;  Location: [facility];  Service: Orthopedics;  Laterality: Right;  2 hrs Choice with Block   SHOULDER OPEN ROTATOR CUFF REPAIR Left 09/14/2014   Procedure: LEFT SHOULDER MINI OPEN ROTATOR CUFF REPAIR AND SUBACROMIAL DECOMPRESSION ;  Surgeon: Jene Every, MD;  Location: [facility];  Service: Orthopedics;  Laterality: Left;   TOTAL HIP ARTHROPLASTY Right 08/22/2021   Procedure: TOTAL HIP ARTHROPLASTY ANTERIOR APPROACH;  Surgeon: Samson Frederic, MD;  Location: [facility];  Service: Orthopedics;  Laterality: Right;  150    Social History   Tobacco Use  Smoking Status Never  Smokeless Tobacco Never    Social History   Substance and Sexual Activity  Alcohol Use Yes   Comment: rarely    Family History  Problem Relation Age of Onset   Colon cancer Neg Hx    Colon polyps Neg Hx    Esophageal cancer Neg Hx    Rectal cancer Neg Hx    Stomach cancer Neg Hx     Reviw of Systems:  Reviewed in the HPI.  All other systems are negative.   Physical Exam: Blood pressure 132/82, pulse 78, height 5\' 11"  (1.803 m), weight 206 lb 12.8 oz (93.8 kg), SpO2 98 %.       GEN:  Well nourished, well developed in no acute distress HEENT: Normal NECK: No JVD; No carotid bruits LYMPHATICS: No lymphadenopathy CARDIAC: RRR , soft systolic murmur  RESPIRATORY:  Clear to auscultation without rales, wheezing or rhonchi  ABDOMEN: Soft, non-tender, non-distended MUSCULOSKELETAL:  No edema; No deformity  SKIN: Warm and dry NEUROLOGIC:  Alert and oriented x 3     ECG: .     Assessment / Plan:   1. Hyperlipidemia-    LDL is 139 . Trigs remain elevated  Doesn't want to take a statin  Is on vascepa and fenofibrate  Needs to improve his diet      2.Hypertension-      blood pressure is well controlled  2.  Systolic heart murmur:      Kristeen Miss, MD  10/21/2022 11:39 AM    [facility] [address] [place],  [address] [place], [place]  [address] Pager [phone] Phone: [phone]; Fax: [phone]

## 2022-10-21 ENCOUNTER — Encounter: Payer: Self-pay | Admitting: Cardiovascular Disease

## 2022-10-21 ENCOUNTER — Ambulatory Visit: Payer: Medicare Other | Attending: Cardiovascular Disease | Admitting: Cardiovascular Disease

## 2022-10-21 VITALS — BP 132/82 | HR 78 | Ht 71.0 in | Wt 206.8 lb

## 2022-10-21 DIAGNOSIS — I10 Essential (primary) hypertension: Secondary | ICD-10-CM | POA: Diagnosis not present

## 2022-10-21 NOTE — Patient Instructions (Signed)
Medication Instructions:   Your physician recommends that you continue on your current medications as directed. Please refer to the Current Medication list given to you today.  *If you need a refill on your cardiac medications before your next appointment, please call your pharmacy*    Follow-Up: At [facility], you and your health needs are our priority.  As part of our continuing mission to provide you with exceptional heart care, we have created designated Provider Care Teams.  These Care Teams include your primary Cardiologist (physician) and Advanced Practice Providers (APPs -  Physician Assistants and Nurse Practitioners) who all work together to provide you with the care you need, when you need it.  We recommend signing up for the patient portal called "MyChart".  Sign up information is provided on this After Visit Summary.  MyChart is used to connect with patients for Virtual Visits (Telemedicine).  Patients are able to view lab/test results, encounter notes, upcoming appointments, etc.  Non-urgent messages can be sent to your provider as well.   To learn more about what you can do with MyChart, go to [email or web address].    Your next appointment:   1 year(s)  Provider:   Philip Nahser, MD       

## 2022-11-24 ENCOUNTER — Other Ambulatory Visit: Payer: Self-pay

## 2022-11-24 MED ORDER — ICOSAPENT ETHYL 1 G PO CAPS: 2.0000 g | ORAL_CAPSULE | Freq: Two times a day (BID) | ORAL | 1 refills | Status: DC

## 2022-12-03 DIAGNOSIS — M545 Low back pain, unspecified: Secondary | ICD-10-CM | POA: Diagnosis not present

## 2022-12-04 ENCOUNTER — Encounter: Payer: Self-pay | Admitting: Cardiovascular Disease

## 2022-12-06 ENCOUNTER — Other Ambulatory Visit: Payer: Self-pay | Admitting: Cardiovascular Disease

## 2022-12-15 DIAGNOSIS — M47896 Other spondylosis, lumbar region: Secondary | ICD-10-CM | POA: Diagnosis not present

## 2023-01-08 ENCOUNTER — Other Ambulatory Visit: Payer: Self-pay | Admitting: Cardiovascular Disease

## 2023-01-19 ENCOUNTER — Telehealth: Payer: Self-pay | Admitting: Neurology

## 2023-01-19 NOTE — Telephone Encounter (Signed)
Call to patient who reports that his "twinge" in the upper left side of his face returned more noticeable last week and he has added an additional dose of  25 mg Lamictal and it has seemed to help. He wanted to make sarah aware and I advised I would send to her and follow back up with any other recommendations. Patient appreciative of call.

## 2023-01-19 NOTE — Telephone Encounter (Signed)
I called the patient, he has had a few twinges of pain in his face, he increased Lamictal from 50 mg BID to 75 mg BID. Still a few twinges but tolerating medication well. Will keep [place] posted.

## 2023-01-19 NOTE — Telephone Encounter (Signed)
Pt has called to report that his twinge has returned(he said Sarah,NP would know what he is referring to) as a result he has increased his lamoTRIgine (LAMICTAL) 25 MG tablet  to 325 mg twice a day

## 2023-01-22 DIAGNOSIS — E785 Hyperlipidemia, unspecified: Secondary | ICD-10-CM | POA: Diagnosis not present

## 2023-01-22 DIAGNOSIS — G44059 Short lasting unilateral neuralgiform headache with conjunctival injection and tearing (SUNCT), not intractable: Secondary | ICD-10-CM | POA: Diagnosis not present

## 2023-01-22 DIAGNOSIS — E1169 Type 2 diabetes mellitus with other specified complication: Secondary | ICD-10-CM | POA: Diagnosis not present

## 2023-01-22 DIAGNOSIS — I10 Essential (primary) hypertension: Secondary | ICD-10-CM | POA: Diagnosis not present

## 2023-01-22 DIAGNOSIS — N183 Chronic kidney disease, stage 3 unspecified: Secondary | ICD-10-CM | POA: Diagnosis not present

## 2023-02-17 DIAGNOSIS — M25562 Pain in left knee: Secondary | ICD-10-CM | POA: Diagnosis not present

## 2023-03-02 ENCOUNTER — Other Ambulatory Visit (HOSPITAL_COMMUNITY): Payer: Self-pay | Admitting: Family Medicine

## 2023-03-02 ENCOUNTER — Ambulatory Visit (HOSPITAL_COMMUNITY)
Admission: RE | Admit: 2023-03-02 | Discharge: 2023-03-02 | Disposition: A | Discharge status: Home / Self Care | Payer: Medicare Other | Source: Ambulatory Visit | Attending: Family Medicine | Admitting: Family Medicine

## 2023-03-02 DIAGNOSIS — N183 Chronic kidney disease, stage 3 unspecified: Secondary | ICD-10-CM | POA: Diagnosis not present

## 2023-03-02 DIAGNOSIS — N1831 Chronic kidney disease, stage 3a: Secondary | ICD-10-CM | POA: Diagnosis not present

## 2023-03-02 DIAGNOSIS — K8591 Acute pancreatitis with uninfected necrosis, unspecified: Secondary | ICD-10-CM | POA: Diagnosis not present

## 2023-03-02 DIAGNOSIS — R109 Unspecified abdominal pain: Secondary | ICD-10-CM | POA: Diagnosis not present

## 2023-03-02 DIAGNOSIS — Q53112 Unilateral inguinal testis: Secondary | ICD-10-CM | POA: Diagnosis not present

## 2023-03-02 DIAGNOSIS — R1032 Left lower quadrant pain: Secondary | ICD-10-CM | POA: Insufficient documentation

## 2023-03-02 DIAGNOSIS — R110 Nausea: Secondary | ICD-10-CM | POA: Diagnosis not present

## 2023-03-02 DIAGNOSIS — K5730 Diverticulosis of large intestine without perforation or abscess without bleeding: Secondary | ICD-10-CM | POA: Diagnosis not present

## 2023-03-02 DIAGNOSIS — K921 Melena: Secondary | ICD-10-CM | POA: Diagnosis not present

## 2023-03-02 MED ORDER — IOHEXOL 300 MG/ML  SOLN: 30.0000 mL | Freq: Once | INTRAMUSCULAR | Status: DC | PRN

## 2023-03-11 DIAGNOSIS — G4733 Obstructive sleep apnea (adult) (pediatric): Secondary | ICD-10-CM | POA: Diagnosis not present

## 2023-03-20 ENCOUNTER — Encounter (HOSPITAL_COMMUNITY): Payer: Self-pay | Admitting: Internal Medicine

## 2023-03-20 ENCOUNTER — Other Ambulatory Visit: Payer: Self-pay

## 2023-03-20 ENCOUNTER — Emergency Department (HOSPITAL_COMMUNITY): Payer: Medicare Other

## 2023-03-20 ENCOUNTER — Inpatient Hospital Stay (HOSPITAL_COMMUNITY)
Admission: EM | Admit: 2023-03-20 | Discharge: 2023-03-23 | DRG: 637 | Disposition: A | Discharge status: Home / Self Care | Payer: Medicare Other | Attending: Obstetrics and Gynecology | Admitting: Obstetrics and Gynecology

## 2023-03-20 DIAGNOSIS — R7989 Other specified abnormal findings of blood chemistry: Secondary | ICD-10-CM | POA: Diagnosis present

## 2023-03-20 DIAGNOSIS — I4891 Unspecified atrial fibrillation: Secondary | ICD-10-CM | POA: Diagnosis not present

## 2023-03-20 DIAGNOSIS — K859 Acute pancreatitis without necrosis or infection, unspecified: Secondary | ICD-10-CM | POA: Diagnosis not present

## 2023-03-20 DIAGNOSIS — I1310 Hypertensive heart and chronic kidney disease without heart failure, with stage 1 through stage 4 chronic kidney disease, or unspecified chronic kidney disease: Secondary | ICD-10-CM | POA: Diagnosis present

## 2023-03-20 DIAGNOSIS — I447 Left bundle-branch block, unspecified: Secondary | ICD-10-CM | POA: Diagnosis present

## 2023-03-20 DIAGNOSIS — Z79899 Other long term (current) drug therapy: Secondary | ICD-10-CM | POA: Diagnosis not present

## 2023-03-20 DIAGNOSIS — Z7982 Long term (current) use of aspirin: Secondary | ICD-10-CM

## 2023-03-20 DIAGNOSIS — D631 Anemia in chronic kidney disease: Secondary | ICD-10-CM | POA: Diagnosis not present

## 2023-03-20 DIAGNOSIS — N400 Enlarged prostate without lower urinary tract symptoms: Secondary | ICD-10-CM | POA: Diagnosis present

## 2023-03-20 DIAGNOSIS — R918 Other nonspecific abnormal finding of lung field: Secondary | ICD-10-CM | POA: Diagnosis not present

## 2023-03-20 DIAGNOSIS — R112 Nausea with vomiting, unspecified: Secondary | ICD-10-CM | POA: Diagnosis not present

## 2023-03-20 DIAGNOSIS — Z96641 Presence of right artificial hip joint: Secondary | ICD-10-CM | POA: Diagnosis not present

## 2023-03-20 DIAGNOSIS — Z87442 Personal history of urinary calculi: Secondary | ICD-10-CM | POA: Diagnosis not present

## 2023-03-20 DIAGNOSIS — Z85828 Personal history of other malignant neoplasm of skin: Secondary | ICD-10-CM

## 2023-03-20 DIAGNOSIS — K219 Gastro-esophageal reflux disease without esophagitis: Secondary | ICD-10-CM | POA: Diagnosis present

## 2023-03-20 DIAGNOSIS — I700 Atherosclerosis of aorta: Secondary | ICD-10-CM | POA: Diagnosis present

## 2023-03-20 DIAGNOSIS — N200 Calculus of kidney: Secondary | ICD-10-CM | POA: Diagnosis not present

## 2023-03-20 DIAGNOSIS — E1165 Type 2 diabetes mellitus with hyperglycemia: Secondary | ICD-10-CM | POA: Diagnosis not present

## 2023-03-20 DIAGNOSIS — M1990 Unspecified osteoarthritis, unspecified site: Secondary | ICD-10-CM | POA: Diagnosis present

## 2023-03-20 DIAGNOSIS — E861 Hypovolemia: Secondary | ICD-10-CM | POA: Diagnosis not present

## 2023-03-20 DIAGNOSIS — E1122 Type 2 diabetes mellitus with diabetic chronic kidney disease: Secondary | ICD-10-CM | POA: Diagnosis present

## 2023-03-20 DIAGNOSIS — N183 Chronic kidney disease, stage 3 unspecified: Secondary | ICD-10-CM | POA: Diagnosis present

## 2023-03-20 DIAGNOSIS — I10 Essential (primary) hypertension: Secondary | ICD-10-CM | POA: Diagnosis present

## 2023-03-20 DIAGNOSIS — E781 Pure hyperglyceridemia: Secondary | ICD-10-CM | POA: Diagnosis present

## 2023-03-20 DIAGNOSIS — G4730 Sleep apnea, unspecified: Secondary | ICD-10-CM | POA: Diagnosis present

## 2023-03-20 DIAGNOSIS — Z888 Allergy status to other drugs, medicaments and biological substances status: Secondary | ICD-10-CM

## 2023-03-20 DIAGNOSIS — R531 Weakness: Secondary | ICD-10-CM | POA: Diagnosis not present

## 2023-03-20 DIAGNOSIS — N529 Male erectile dysfunction, unspecified: Secondary | ICD-10-CM | POA: Diagnosis not present

## 2023-03-20 DIAGNOSIS — E1169 Type 2 diabetes mellitus with other specified complication: Secondary | ICD-10-CM | POA: Diagnosis present

## 2023-03-20 DIAGNOSIS — N1832 Chronic kidney disease, stage 3b: Secondary | ICD-10-CM | POA: Diagnosis not present

## 2023-03-20 DIAGNOSIS — E1100 Type 2 diabetes mellitus with hyperosmolarity without nonketotic hyperglycemic-hyperosmolar coma (NKHHC): Principal | ICD-10-CM | POA: Diagnosis present

## 2023-03-20 DIAGNOSIS — Z794 Long term (current) use of insulin: Secondary | ICD-10-CM | POA: Diagnosis not present

## 2023-03-20 DIAGNOSIS — R739 Hyperglycemia, unspecified: Secondary | ICD-10-CM

## 2023-03-20 DIAGNOSIS — Z860100 Personal history of colon polyps, unspecified: Secondary | ICD-10-CM

## 2023-03-20 DIAGNOSIS — Z7984 Long term (current) use of oral hypoglycemic drugs: Secondary | ICD-10-CM

## 2023-03-20 DIAGNOSIS — K5730 Diverticulosis of large intestine without perforation or abscess without bleeding: Secondary | ICD-10-CM | POA: Diagnosis not present

## 2023-03-20 LAB — CBG MONITORING, ED
Glucose-Capillary: 409 mg/dL — ABNORMAL HIGH (ref 70–99)
Glucose-Capillary: 352 mg/dL — ABNORMAL HIGH (ref 70–99)
Glucose-Capillary: 234 mg/dL — ABNORMAL HIGH (ref 70–99)

## 2023-03-20 LAB — COMPREHENSIVE METABOLIC PANEL
ALT: 26 U/L (ref 0–44)
AST: 21 U/L (ref 15–41)
Albumin: 3.4 g/dL — ABNORMAL LOW (ref 3.5–5.0)
Alkaline Phosphatase: 66 U/L (ref 38–126)
Anion gap: 13 (ref 5–15)
BUN: 50 mg/dL — ABNORMAL HIGH (ref 8–23)
CO2: 22 mmol/L (ref 22–32)
Calcium: 9.9 mg/dL (ref 8.9–10.3)
Chloride: 96 mmol/L — ABNORMAL LOW (ref 98–111)
Creatinine, Ser: 1.47 mg/dL — ABNORMAL HIGH (ref 0.61–1.24)
GFR, Estimated: 48 mL/min — ABNORMAL LOW (ref >60)
Glucose, Bld: 574 mg/dL (ref 70–99)
Potassium: 4.7 mmol/L (ref 3.5–5.1)
Sodium: 131 mmol/L — ABNORMAL LOW (ref 135–145)
Total Bilirubin: 1 mg/dL (ref <1.2)
Total Protein: 7.5 g/dL (ref 6.5–8.1)

## 2023-03-20 LAB — URINALYSIS, ROUTINE W REFLEX MICROSCOPIC
Bacteria, UA: NONE SEEN
Bilirubin Urine: NEGATIVE
Glucose, UA: >=500 mg/dL — AB
Hgb urine dipstick: NEGATIVE
Ketones, ur: NEGATIVE mg/dL
Leukocytes,Ua: NEGATIVE
Nitrite: NEGATIVE
Protein, ur: NEGATIVE mg/dL
Specific Gravity, Urine: 1.026 (ref 1.005–1.030)
pH: 5 (ref 5.0–8.0)

## 2023-03-20 LAB — CBC
HCT: 42.6 % (ref 39.0–52.0)
Hemoglobin: 14 g/dL (ref 13.0–17.0)
MCH: 31 pg (ref 26.0–34.0)
MCHC: 32.9 g/dL (ref 30.0–36.0)
MCV: 94.2 fL (ref 80.0–100.0)
Platelets: 265 10*3/uL (ref 150–400)
RBC: 4.52 MIL/uL (ref 4.22–5.81)
RDW: 13.1 % (ref 11.5–15.5)
WBC: 6.5 10*3/uL (ref 4.0–10.5)
nRBC: 0 % (ref 0.0–0.2)

## 2023-03-20 LAB — TROPONIN I (HIGH SENSITIVITY)
Troponin I (High Sensitivity): 12 ng/L (ref <18)
Troponin I (High Sensitivity): 15 ng/L (ref <18)

## 2023-03-20 LAB — LIPASE, BLOOD: Lipase: 119 U/L — ABNORMAL HIGH (ref 11–51)

## 2023-03-20 MED ORDER — SODIUM CHLORIDE 0.9 % IV BOLUS
1000.0000 mL | Freq: Once | INTRAVENOUS | Status: AC
  Administered 2023-03-20: 1000 mL via INTRAVENOUS

## 2023-03-20 MED ORDER — INSULIN ASPART 100 UNIT/ML IJ SOLN
4.0000 [IU] | Freq: Once | INTRAMUSCULAR | Status: AC
  Administered 2023-03-20: 4 [IU] via SUBCUTANEOUS

## 2023-03-20 MED ORDER — LACTATED RINGERS IV SOLN: INTRAVENOUS | Status: AC

## 2023-03-20 MED ORDER — IOHEXOL 350 MG/ML SOLN
75.0000 mL | Freq: Once | INTRAVENOUS | Status: AC | PRN
  Administered 2023-03-20: 75 mL via INTRAVENOUS

## 2023-03-20 MED ORDER — INSULIN ASPART 100 UNIT/ML IJ SOLN: 8.0000 [IU] | Freq: Once | INTRAMUSCULAR | Status: DC

## 2023-03-20 MED ORDER — ONDANSETRON HCL 4 MG/2ML IJ SOLN
4.0000 mg | Freq: Once | INTRAMUSCULAR | Status: AC
  Administered 2023-03-20: 4 mg via INTRAVENOUS
  Filled 2023-03-20: qty 2

## 2023-03-20 NOTE — ED Notes (Addendum)
ED TO INPATIENT HANDOFF REPORT  ED Nurse Name and Phone #: France Ravens [phone]  S Name/Age/Gender Christian Ross 82 y.o. male Room/Bed: 007C/007C  Code Status   Code Status: Prior  Home/SNF/Other Home Patient oriented to: self, place, time, and situation Is this baseline? Yes   Triage Complete: Triage complete  Chief Complaint Generalized weakness [R53.1]  Triage Note Pt c.o generalized weakness, decreased appetite and n.v for the last few weeks. Pt has an outpatient CT scan on 11/11 that showed pancreatitis, duodenitis, prostatomegaly and constipation. Pt denies any abd pain   Allergies Allergies  Allergen Reactions   Lisinopril Cough    Other Reaction(s): Not available    Level of Care/Admitting Diagnosis ED Disposition     ED Disposition  Admit   Condition  --   Comment  Hospital Area: [facility] [[id number]]  Level of Care: Telemetry Medical [104]  May admit patient to [facility] or [facility] if equivalent level of care is available:: No  Covid Evaluation: Asymptomatic - no recent exposure (last 10 days) testing not required  Diagnosis: Generalized weakness [960454]  Admitting Physician: Darlin Drop [[id number]]  Attending Physician: Darlin Drop [[id number]]  Certification:: I certify this patient will need inpatient services for at least 2 midnights  Expected Medical Readiness: 03/22/2023          B Medical/Surgery History Past Medical History:  Diagnosis Date   Allergy    Aortic atherosclerosis (HCC)    Arthritis    Cancer (HCC)    skin pre cancer nose   Cough 08/2014   new x78mo, denies fever and shortness of breath   Diabetes mellitus without complication (HCC)    type 2    Diverticulitis    Erectile dysfunction    Hemorrhoids    History of skin cancer    basal cell removed left arm 09/07/14   Hx of colonic polyps    Hyperlipidemia    Hypertension    orthostatic   Hypertriglyceridemia    Kidney stones    Rotator cuff tear     left   Sleep apnea    cpap setting at 4    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 Anne Hahn   Vitamin D deficiency    Past Surgical History:  Procedure Laterality Date   COLONOSCOPY W/ BIOPSIES AND POLYPECTOMY  multiple, 2005, 08, 14   KNEE SURGERY  1960   right / for osgood- schlatter   NASAL SINUS SURGERY     RECTAL BIOPSY     SHOULDER ARTHROSCOPY WITH ROTATOR CUFF REPAIR AND SUBACROMIAL DECOMPRESSION Right 12/14/2019   Procedure: Right shoulder mini open rotator cuff repair, subacromial decompression,;  Surgeon: Jene Every, MD;  Location: [facility];  Service: Orthopedics;  Laterality: Right;  2 hrs Choice with Block   SHOULDER OPEN ROTATOR CUFF REPAIR Left 09/14/2014   Procedure: LEFT SHOULDER MINI OPEN ROTATOR CUFF REPAIR AND SUBACROMIAL DECOMPRESSION ;  Surgeon: Jene Every, MD;  Location: [facility];  Service: Orthopedics;  Laterality: Left;   TOTAL HIP ARTHROPLASTY Right 08/22/2021   Procedure: TOTAL HIP ARTHROPLASTY ANTERIOR APPROACH;  Surgeon: Samson Frederic, MD;  Location: [facility];  Service: Orthopedics;  Laterality: Right;  150     A IV Location/Drains/Wounds Patient Lines/Drains/Airways Status     Active Line/Drains/Airways     Name Placement date Placement time Site Days   Peripheral IV 03/20/23 22 G Anterior;Left;Proximal Forearm 03/20/23  1612  Forearm  less than 1  Intake/Output Last 24 hours  Intake/Output Summary (Last 24 hours) at 03/20/2023 2045 Last data filed at 03/20/2023 1916 Gross per 24 hour  Intake 1000 ml  Output 250 ml  Net 750 ml    Labs/Imaging Results for orders placed or performed during the hospital encounter of 03/20/23 (from the past 48 hour(s))  CBC     Status: None   Collection Time: 03/20/23 11:28 AM  Result Value Ref Range   WBC 6.5 4.0 - 10.5 K/uL   RBC 4.52 4.22 - 5.81 MIL/uL   Hemoglobin 14.0 13.0 - 17.0 g/dL   HCT 38.7 56.4 - 33.2 %   MCV 94.2 80.0 - 100.0 fL   MCH 31.0 26.0 - 34.0 pg   MCHC 32.9 30.0 -  36.0 g/dL   RDW 95.1 88.4 - 16.6 %   Platelets 265 150 - 400 K/uL   nRBC 0.0 0.0 - 0.2 %    Comment: Performed at [facility], [address], [place], [place] [address]  Urinalysis, Routine w reflex microscopic -Urine, Clean Catch     Status: Abnormal   Collection Time: 03/20/23 11:28 AM  Result Value Ref Range   Color, Urine YELLOW YELLOW   APPearance HAZY (A) CLEAR   Specific Gravity, Urine 1.026 1.005 - 1.030   pH 5.0 5.0 - 8.0   Glucose, UA >=500 (A) NEGATIVE mg/dL   Hgb urine dipstick NEGATIVE NEGATIVE   Bilirubin Urine NEGATIVE NEGATIVE   Ketones, ur NEGATIVE NEGATIVE mg/dL   Protein, ur NEGATIVE NEGATIVE mg/dL   Nitrite NEGATIVE NEGATIVE   Leukocytes,Ua NEGATIVE NEGATIVE   RBC / HPF 0-5 0 - 5 RBC/hpf   WBC, UA 0-5 0 - 5 WBC/hpf   Bacteria, UA NONE SEEN NONE SEEN   Squamous Epithelial / HPF 0-5 0 - 5 /HPF   Mucus PRESENT     Comment: Performed at [facility], [address], [place], [place] [address]  Lipase, blood     Status: Abnormal   Collection Time: 03/20/23 11:28 AM  Result Value Ref Range   Lipase 119 (H) 11 - 51 U/L    Comment: Performed at [facility], [address], [place], [place] [address]  Comprehensive metabolic panel     Status: Abnormal   Collection Time: 03/20/23 11:29 AM  Result Value Ref Range   Sodium 131 (L) 135 - 145 mmol/L   Potassium 4.7 3.5 - 5.1 mmol/L   Chloride 96 (L) 98 - 111 mmol/L   CO2 22 22 - 32 mmol/L   Glucose, Bld 574 (HH) 70 - 99 mg/dL    Comment: CRITICAL RESULT CALLED TO, READ BACK BY AND VERIFIED WITH T.SHROPSHIRE,RN 1228 03/20/23 CLARK,S Glucose reference range applies only to samples taken after fasting for at least 8 hours.    BUN 50 (H) 8 - 23 mg/dL   Creatinine, Ser 7.32 (H) 0.61 - 1.24 mg/dL   Calcium 9.9 8.9 - 20.2 mg/dL   Total Protein 7.5 6.5 - 8.1 g/dL   Albumin 3.4 (L) 3.5 - 5.0 g/dL   AST 21 15 - 41 U/L   ALT 26 0 - 44 U/L   Alkaline Phosphatase 66 38 - 126 U/L   Total Bilirubin 1.0  <1.2 mg/dL   GFR, Estimated 48 (L) >60 mL/min    Comment: (NOTE) Calculated using the CKD-EPI Creatinine Equation (2021)    Anion gap 13 5 - 15    Comment: Performed at [facility], [address], [place], [place] [address]  CBG monitoring, ED     Status: Abnormal   Collection Time: 03/20/23  5:42 PM  Result Value Ref Range   Glucose-Capillary 409 (H) 70 - 99 mg/dL    Comment: Glucose reference range applies only to samples taken after fasting for at least 8 hours.  POC CBG, ED     Status: Abnormal   Collection Time: 03/20/23  7:39 PM  Result Value Ref Range   Glucose-Capillary 352 (H) 70 - 99 mg/dL    Comment: Glucose reference range applies only to samples taken after fasting for at least 8 hours.  Troponin I (High Sensitivity)     Status: None   Collection Time: 03/20/23  7:53 PM  Result Value Ref Range   Troponin I (High Sensitivity) 12 <18 ng/L    Comment: (NOTE) Elevated high sensitivity troponin I (hsTnI) values and significant  changes across serial measurements may suggest ACS but many other  chronic and acute conditions are known to elevate hsTnI results.  Refer to the "Links" section for chest pain algorithms and additional  guidance. Performed at [facility], [address], [place], [place] [address]    DG Chest Port 1 View  Result Date: 03/20/2023 CLINICAL DATA:  Atrial fibrillation EXAM: PORTABLE CHEST 1 VIEW COMPARISON:  10/03/2021 FINDINGS: Shallow inspiration. Heart size and pulmonary vascularity are normal. Hazy linear opacities in the left base may represent atelectasis or infiltration. Right lung is clear. No pleural effusions. No pneumothorax. Mediastinal contours appear intact. IMPRESSION: Shallow inspiration with infiltration or atelectasis in the left base. Electronically Signed   By: Burman Nieves M.D.   On: 03/20/2023 20:07   CT ABDOMEN PELVIS W CONTRAST  Result Date: 03/20/2023 CLINICAL DATA:  Several week history of decreased  appetite with nausea and vomiting EXAM: CT ABDOMEN AND PELVIS WITH CONTRAST TECHNIQUE: Multidetector CT imaging of the abdomen and pelvis was performed using the standard protocol following bolus administration of intravenous contrast. RADIATION DOSE REDUCTION: This exam was performed according to the departmental dose-optimization program which includes automated exposure control, adjustment of the mA and/or kV according to patient size and/or use of iterative reconstruction technique. CONTRAST:  75mL OMNIPAQUE IOHEXOL 350 MG/ML SOLN COMPARISON:  CT abdomen and pelvis dated 03/02/2023 FINDINGS: Lower chest: No focal consolidation or pulmonary nodule in the lung bases. No pleural effusion or pneumothorax demonstrated. Left atrial enlargement. Coronary artery calcifications. Hepatobiliary: Subcentimeter hypodensity in segment 6/7 (3:26), too small to characterize. No intra or extrahepatic biliary ductal dilation. Normal gallbladder. Pancreas: Persistent but decreased peripancreatic stranding. Diffuse heterogeneous enhancement of the pancreas. No main pancreatic ductal dilation. Spleen: Normal in size without focal abnormality. Adrenals/Urinary Tract: No adrenal nodules. No suspicious renal masses or hydronephrosis. Bilateral nonobstructing renal stones measuring up to 3 mm in the left upper pole. No focal bladder wall thickening. Suboptimal evaluation of the right posterior bladder due to beam hardening artifact from hip arthroplasty. Stomach/Bowel: Normal appearance of the stomach. No evidence of bowel wall thickening, distention, or inflammatory changes. Colonic diverticulosis without acute diverticulitis. Normal appendix. Vascular/Lymphatic: Aortic atherosclerosis. No enlarged abdominal or pelvic lymph nodes. Reproductive: Prostate is unremarkable. Other: No free fluid, fluid collection, or free air. Musculoskeletal: No acute or abnormal lytic or blastic osseous lesions. Right hip arthroplasty. Multilevel  degenerative changes of the partially imaged thoracic and lumbar spine. Unchanged grade 1 anterolisthesis at L5-S1. IMPRESSION: 1. Persistent but decreased peripancreatic stranding, consistent with improving acute interstitial pancreatitis. No peripancreatic collection. 2. Bilateral  nonobstructing nephrolithiasis. 3. Colonic diverticulosis without acute diverticulitis. 4. Aortic Atherosclerosis (ICD10-I70.0). Coronary artery calcifications. Assessment for potential risk factor modification, dietary therapy or pharmacologic therapy may be warranted, if clinically indicated. Electronically Signed   By: Agustin Cree M.D.   On: 03/20/2023 17:40    Pending Labs Unresulted Labs (From admission, onward)    None       Vitals/Pain Today's Vitals   03/20/23 1405 03/20/23 1745 03/20/23 1752 03/20/23 1915  BP: (!) 130/93 138/85  (!) 136/96  Pulse: 76 86  63  Resp: 15 10  14   Temp: 97.7 F (36.5 C)  98 F (36.7 C)   TempSrc: Oral  Oral   SpO2: 99% 100%  99%  PainSc:        Isolation Precautions No active isolations  Medications Medications  lactated ringers infusion ( Intravenous New Bag/Given 03/20/23 1949)  ondansetron (ZOFRAN) injection 4 mg (4 mg Intravenous Given 03/20/23 1617)  sodium chloride 0.9 % bolus 1,000 mL (0 mLs Intravenous Stopped 03/20/23 1916)  insulin aspart (novoLOG) injection 4 Units (4 Units Subcutaneous Given 03/20/23 1618)  iohexol (OMNIPAQUE) 350 MG/ML injection 75 mL (75 mLs Intravenous Contrast Given 03/20/23 1722)  insulin aspart (novoLOG) injection 4 Units (4 Units Subcutaneous Given 03/20/23 1947)    Mobility Walks with assistance     Focused Assessments    R Recommendations: See Admitting Provider Note  Report given to:   Additional Notes:

## 2023-03-20 NOTE — ED Provider Notes (Signed)
CT to f/u pancreatitis duodenitis. Getting fluids and insulin for cbg 500. Probable admission. Physical Exam  BP (!) 130/93 (BP Location: Right Arm)   Pulse 76   Temp 97.7 F (36.5 C) (Oral)   Resp 15   SpO2 99%   Physical Exam  Procedures  Procedures  ED Course / MDM    Medical Decision Making Amount and/or Complexity of Data Reviewed Labs: ordered. Radiology: ordered.  Risk Prescription drug management. Decision regarding hospitalization.   The patient remains alert.  He is making urine.  Recheck blood sugar is 352.  Repeat EKG does show atrial fibrillation.  At this time we will plan for admission for pancreatitis with poor oral intake and new onset atrial fibrillation.  Consult: After Margo Aye [facility] hospitalist to admit.      Arby Barrette, MD 03/20/23 2038

## 2023-03-20 NOTE — ED Provider Notes (Signed)
[place] EMERGENCY DEPARTMENT AT [facility] ([facility]) Provider Note   CSN: [id number] Arrival date & time: 03/20/23  1017     History  Chief Complaint  Patient presents with   Weakness    Christian Ross is a 82 y.o. male.  He has been having general weakness decreased appetite nausea vomiting that is been going on for almost 3 weeks.  It started when he was in [facility] when he had abdominal pain in his left lower quadrant.  He saw his primary care doctor and had a CAT scan that showed pancreatitis duodenitis.  He denies any alcohol.  He has been trying to stay well-hydrated but has had no appetite and feeling weaker.  No chest pain or shortness of breath.  No fevers or chills.  No further abdominal pain.  The history is provided by the patient and the spouse.  Weakness Severity:  Severe Onset quality:  Gradual Duration:  3 weeks Timing:  Constant Progression:  Worsening Chronicity:  New Relieved by:  Nothing Worsened by:  Activity Ineffective treatments:  Rest and drinking fluids Associated symptoms: difficulty walking, dizziness, nausea and vomiting   Associated symptoms: no chest pain, no dysuria, no fever and no shortness of breath        Home Medications Prior to Admission medications   Medication Sig Start Date End Date Taking? Authorizing Provider  Apoaequorin (PREVAGEN PO) Take 1 tablet by mouth daily.    [provider]  Ascorbic Acid (VITAMIN C) 1000 MG tablet Take 1,000 mg by mouth daily.    [provider]  aspirin 81 MG chewable tablet Chew 1 tablet by mouth in the morning.    [provider]  Berberine Chloride POWD Take 800 mg by mouth every morning. 2 caps every morning    [provider]  fenofibrate (TRICOR) 145 MG tablet TAKE 1 TABLET(145 MG) BY MOUTH DAILY 12/08/22   Nahser, Deloris Ping, MD  glipiZIDE (GLUCOTROL XL) 10 MG 24 hr tablet Take 10 mg by mouth daily with breakfast.  11/08/18   [provider]   Glucosamine-Chondroit-Vit C-Mn (GLUCOSAMINE 1500 COMPLEX) CAPS Take 2 tablets by mouth daily.    [provider]  icosapent Ethyl (VASCEPA) 1 g capsule Take 2 capsules (2 g total) by mouth 2 (two) times daily. 11/24/22   Nahser, Deloris Ping, MD  lamoTRIgine (LAMICTAL) 25 MG tablet Take 2 tablets (50 mg total) by mouth 2 (two) times daily. 08/21/22   Glean Salvo, NP  Lecithin 1200 MG CAPS Take 2,400 mg by mouth every morning.     [provider]  metoprolol tartrate (LOPRESSOR) 25 MG tablet TAKE 1 TABLET TWICE A DAY 01/08/23   Nahser, Deloris Ping, MD  omeprazole (PRILOSEC) 20 MG capsule Take 1 capsule (20 mg total) by mouth daily. 30 minutes before breakfast 01/08/22   Unk Lightning, PA  OVER THE COUNTER MEDICATION Take 2 capsules by mouth every morning. Cool Yahoo, Historical, MD  Saw Palmetto, Serenoa repens, (SAW PALMETTO PO) Take 2 capsules by mouth in the morning and at bedtime.  Patient not taking: Reported on 10/21/2022    [provider]  topiramate (TOPAMAX) 50 MG tablet Take 1 tablet (50 mg total) by mouth 2 (two) times daily. 08/21/22   Glean Salvo, NP  Ubiquinol 200 MG CAPS Take 400 mg by mouth in the morning and at bedtime.     [provider]  irbesartan (AVAPRO) 150 MG tablet  Take 1 tablet (150 mg total) by mouth daily. 06/24/11 06/26/11  Nahser, Deloris Ping, MD      Allergies    Lisinopril    Review of Systems   Review of Systems  Constitutional:  Negative for fever.  Respiratory:  Negative for shortness of breath.   Cardiovascular:  Negative for chest pain.  Gastrointestinal:  Positive for nausea and vomiting.  Genitourinary:  Negative for dysuria.  Neurological:  Positive for dizziness and weakness.    Physical Exam Updated Vital Signs BP (!) 130/93 (BP Location: Right Arm)   Pulse 76   Temp 97.7 F (36.5 C) (Oral)   Resp 15   SpO2 99%  Physical Exam Vitals and nursing note reviewed.  Constitutional:      General: He  is not in acute distress.    Appearance: Normal appearance. He is well-developed.  HENT:     Head: Normocephalic and atraumatic.  Eyes:     Conjunctiva/sclera: Conjunctivae normal.  Cardiovascular:     Rate and Rhythm: Normal rate and regular rhythm.     Heart sounds: No murmur heard. Pulmonary:     Effort: Pulmonary effort is normal. No respiratory distress.     Breath sounds: Normal breath sounds.  Abdominal:     Palpations: Abdomen is soft.     Tenderness: There is no abdominal tenderness. There is no guarding or rebound.  Musculoskeletal:        General: No deformity. Normal range of motion.     Cervical back: Neck supple.  Skin:    General: Skin is warm and dry.     Capillary Refill: Capillary refill takes less than 2 seconds.  Neurological:     General: No focal deficit present.     Mental Status: He is alert.     Cranial Nerves: No cranial nerve deficit.     Sensory: No sensory deficit.     Motor: No weakness.     ED Results / Procedures / Treatments   Labs (all labs ordered are listed, but only abnormal results are displayed) Labs Reviewed  URINALYSIS, ROUTINE W REFLEX MICROSCOPIC - Abnormal; Notable for the following components:      Result Value   APPearance HAZY (*)    Glucose, UA >=500 (*)    All other components within normal limits  LIPASE, BLOOD - Abnormal; Notable for the following components:   Lipase 119 (*)    All other components within normal limits  COMPREHENSIVE METABOLIC PANEL - Abnormal; Notable for the following components:   Sodium 131 (*)    Chloride 96 (*)    Glucose, Bld 574 (*)    BUN 50 (*)    Creatinine, Ser 1.47 (*)    Albumin 3.4 (*)    GFR, Estimated 48 (*)    All other components within normal limits  CBG MONITORING, ED - Abnormal; Notable for the following components:   Glucose-Capillary 409 (*)    All other components within normal limits  CBC    EKG None  Radiology CT ABDOMEN PELVIS W CONTRAST  Result Date:  03/20/2023 CLINICAL DATA:  Several week history of decreased appetite with nausea and vomiting EXAM: CT ABDOMEN AND PELVIS WITH CONTRAST TECHNIQUE: Multidetector CT imaging of the abdomen and pelvis was performed using the standard protocol following bolus administration of intravenous contrast. RADIATION DOSE REDUCTION: This exam was performed according to the departmental dose-optimization program which includes automated exposure control, adjustment of the mA and/or kV according to patient size and/or  use of iterative reconstruction technique. CONTRAST:  75mL OMNIPAQUE IOHEXOL 350 MG/ML SOLN COMPARISON:  CT abdomen and pelvis dated 03/02/2023 FINDINGS: Lower chest: No focal consolidation or pulmonary nodule in the lung bases. No pleural effusion or pneumothorax demonstrated. Left atrial enlargement. Coronary artery calcifications. Hepatobiliary: Subcentimeter hypodensity in segment 6/7 (3:26), too small to characterize. No intra or extrahepatic biliary ductal dilation. Normal gallbladder. Pancreas: Persistent but decreased peripancreatic stranding. Diffuse heterogeneous enhancement of the pancreas. No main pancreatic ductal dilation. Spleen: Normal in size without focal abnormality. Adrenals/Urinary Tract: No adrenal nodules. No suspicious renal masses or hydronephrosis. Bilateral nonobstructing renal stones measuring up to 3 mm in the left upper pole. No focal bladder wall thickening. Suboptimal evaluation of the right posterior bladder due to beam hardening artifact from hip arthroplasty. Stomach/Bowel: Normal appearance of the stomach. No evidence of bowel wall thickening, distention, or inflammatory changes. Colonic diverticulosis without acute diverticulitis. Normal appendix. Vascular/Lymphatic: Aortic atherosclerosis. No enlarged abdominal or pelvic lymph nodes. Reproductive: Prostate is unremarkable. Other: No free fluid, fluid collection, or free air. Musculoskeletal: No acute or abnormal lytic or  blastic osseous lesions. Right hip arthroplasty. Multilevel degenerative changes of the partially imaged thoracic and lumbar spine. Unchanged grade 1 anterolisthesis at L5-S1. IMPRESSION: 1. Persistent but decreased peripancreatic stranding, consistent with improving acute interstitial pancreatitis. No peripancreatic collection. 2. Bilateral nonobstructing nephrolithiasis. 3. Colonic diverticulosis without acute diverticulitis. 4. Aortic Atherosclerosis (ICD10-I70.0). Coronary artery calcifications. Assessment for potential risk factor modification, dietary therapy or pharmacologic therapy may be warranted, if clinically indicated. Electronically Signed   By: Agustin Cree M.D.   On: 03/20/2023 17:40    Procedures Procedures    Medications Ordered in ED Medications  ondansetron (ZOFRAN) injection 4 mg (4 mg Intravenous Given 03/20/23 1617)  sodium chloride 0.9 % bolus 1,000 mL (1,000 mLs Intravenous New Bag/Given 03/20/23 1617)  insulin aspart (novoLOG) injection 4 Units (4 Units Subcutaneous Given 03/20/23 1618)  iohexol (OMNIPAQUE) 350 MG/ML injection 75 mL (75 mLs Intravenous Contrast Given 03/20/23 1722)    ED Course/ Medical Decision Making/ A&P                                 Medical Decision Making Amount and/or Complexity of Data Reviewed Labs: ordered. Radiology: ordered.  Risk Prescription drug management.   This patient complains of general weakness decreased appetite feeling dehydrated; this involves an extensive number of treatment Options and is a complaint that carries with it a high risk of complications and morbidity. The differential includes dehydration, AKI, metabolic derangement, pancreatitis, colitis  I ordered, reviewed and interpreted labs, which included CBC normal chemistries with low sodium elevated glucose elevated BUN and creatinine, lipase mildly elevated I ordered medication IV fluids nausea medication insulin and reviewed PMP when indicated. I ordered  imaging studies which included CT abdomen and pelvis and this is pending at time of signout  additional history obtained from patient's wife Previous records obtained and reviewed in epic, no recent ED visits or admissions Cardiac monitoring reviewed, sinus rhythm Social determinants considered, no significant barriers Critical Interventions: None  After the interventions stated above, I reevaluated the patient and found patient to be in no distress Admission and further testing considered, his care is signed out to Dr. Clarice Pole to follow-up on results of CT and response to intervention.  It sounds like he has been steadily declining at home so he may need to be admitted for hydration  Final Clinical Impression(s) / ED Diagnoses Final diagnoses:  Generalized weakness  Hyperglycemia    Rx / DC Orders ED Discharge Orders     None         Terrilee Files, MD 03/20/23 Paulo Fruit

## 2023-03-20 NOTE — ED Triage Notes (Signed)
Pt c.o generalized weakness, decreased appetite and n.v for the last few weeks. Pt has an outpatient CT scan on 11/11 that showed pancreatitis, duodenitis, prostatomegaly and constipation. Pt denies any abd pain

## 2023-03-20 NOTE — Plan of Care (Signed)

## 2023-03-20 NOTE — ED Notes (Signed)
CBG of 352 discussed with MD Pfeiffer. Verbal order received to DC 8 units of insulin order and give 4 units of insulin sub q. Dose verified with Gena Fray prior to [employer].

## 2023-03-21 ENCOUNTER — Encounter (HOSPITAL_COMMUNITY): Payer: Self-pay | Admitting: Internal Medicine

## 2023-03-21 ENCOUNTER — Inpatient Hospital Stay (HOSPITAL_COMMUNITY): Payer: Medicare Other

## 2023-03-21 DIAGNOSIS — K859 Acute pancreatitis without necrosis or infection, unspecified: Secondary | ICD-10-CM | POA: Insufficient documentation

## 2023-03-21 DIAGNOSIS — R531 Weakness: Secondary | ICD-10-CM | POA: Diagnosis not present

## 2023-03-21 DIAGNOSIS — E1100 Type 2 diabetes mellitus with hyperosmolarity without nonketotic hyperglycemic-hyperosmolar coma (NKHHC): Secondary | ICD-10-CM

## 2023-03-21 DIAGNOSIS — I10 Essential (primary) hypertension: Secondary | ICD-10-CM

## 2023-03-21 DIAGNOSIS — I4891 Unspecified atrial fibrillation: Secondary | ICD-10-CM

## 2023-03-21 LAB — CBC
HCT: 37.1 % — ABNORMAL LOW (ref 39.0–52.0)
Hemoglobin: 12.2 g/dL — ABNORMAL LOW (ref 13.0–17.0)
MCH: 30.9 pg (ref 26.0–34.0)
MCHC: 32.9 g/dL (ref 30.0–36.0)
MCV: 93.9 fL (ref 80.0–100.0)
Platelets: 208 10*3/uL (ref 150–400)
RBC: 3.95 MIL/uL — ABNORMAL LOW (ref 4.22–5.81)
RDW: 13.2 % (ref 11.5–15.5)
WBC: 6.2 10*3/uL (ref 4.0–10.5)
nRBC: 0 % (ref 0.0–0.2)

## 2023-03-21 LAB — ECHOCARDIOGRAM COMPLETE
AR max vel: 1.95 cm2
AV Area VTI: 1.9 cm2
AV Area mean vel: 1.9 cm2
AV Mean grad: 6 mm[Hg]
AV Peak grad: 10.4 mm[Hg]
Ao pk vel: 1.61 m/s
Calc EF: 44.2 %
S' Lateral: 3 cm
Single Plane A2C EF: 43.5 %
Single Plane A4C EF: 44.2 %
Weight: 2960 [oz_av]

## 2023-03-21 LAB — GLUCOSE, CAPILLARY
Glucose-Capillary: 222 mg/dL — ABNORMAL HIGH (ref 70–99)
Glucose-Capillary: 269 mg/dL — ABNORMAL HIGH (ref 70–99)
Glucose-Capillary: 134 mg/dL — ABNORMAL HIGH (ref 70–99)
Glucose-Capillary: 131 mg/dL — ABNORMAL HIGH (ref 70–99)

## 2023-03-21 LAB — BASIC METABOLIC PANEL
Anion gap: 9 (ref 5–15)
BUN: 34 mg/dL — ABNORMAL HIGH (ref 8–23)
CO2: 23 mmol/L (ref 22–32)
Calcium: 8.8 mg/dL — ABNORMAL LOW (ref 8.9–10.3)
Chloride: 102 mmol/L (ref 98–111)
Creatinine, Ser: 1.37 mg/dL — ABNORMAL HIGH (ref 0.61–1.24)
GFR, Estimated: 52 mL/min — ABNORMAL LOW (ref >60)
Glucose, Bld: 234 mg/dL — ABNORMAL HIGH (ref 70–99)
Potassium: 4.1 mmol/L (ref 3.5–5.1)
Sodium: 134 mmol/L — ABNORMAL LOW (ref 135–145)

## 2023-03-21 LAB — CREATININE, SERUM
Creatinine, Ser: 1.45 mg/dL — ABNORMAL HIGH (ref 0.61–1.24)
GFR, Estimated: 49 mL/min — ABNORMAL LOW (ref >60)

## 2023-03-21 LAB — HEMOGLOBIN A1C
Hgb A1c MFr Bld: 11.9 % — ABNORMAL HIGH (ref 4.8–5.6)
Mean Plasma Glucose: 294.83 mg/dL

## 2023-03-21 MED ORDER — ENOXAPARIN SODIUM 40 MG/0.4ML IJ SOSY
40.0000 mg | PREFILLED_SYRINGE | Freq: Every day | INTRAMUSCULAR | Status: DC
  Administered 2023-03-21 – 2023-03-22 (×2): 40 mg via SUBCUTANEOUS
  Filled 2023-03-21 (×2): qty 0.4

## 2023-03-21 MED ORDER — INSULIN ASPART 100 UNIT/ML IJ SOLN
0.0000 [IU] | Freq: Every day | INTRAMUSCULAR | Status: DC
  Administered 2023-03-22: 4 [IU] via SUBCUTANEOUS

## 2023-03-21 MED ORDER — TOPIRAMATE 25 MG PO TABS
50.0000 mg | ORAL_TABLET | Freq: Two times a day (BID) | ORAL | Status: DC
  Administered 2023-03-21 – 2023-03-23 (×5): 50 mg via ORAL
  Filled 2023-03-21 (×5): qty 2

## 2023-03-21 MED ORDER — ICOSAPENT ETHYL 1 G PO CAPS
2.0000 g | ORAL_CAPSULE | Freq: Two times a day (BID) | ORAL | Status: DC
  Administered 2023-03-21 – 2023-03-23 (×5): 2 g via ORAL
  Filled 2023-03-21 (×6): qty 2

## 2023-03-21 MED ORDER — PROCHLORPERAZINE EDISYLATE 10 MG/2ML IJ SOLN: 5.0000 mg | Freq: Four times a day (QID) | INTRAMUSCULAR | Status: DC | PRN

## 2023-03-21 MED ORDER — METOPROLOL TARTRATE 25 MG PO TABS: 25.0000 mg | ORAL_TABLET | Freq: Two times a day (BID) | ORAL | Status: DC

## 2023-03-21 MED ORDER — INSULIN GLARGINE-YFGN 100 UNIT/ML ~~LOC~~ SOLN
10.0000 [IU] | Freq: Two times a day (BID) | SUBCUTANEOUS | Status: DC
  Administered 2023-03-21 – 2023-03-22 (×3): 10 [IU] via SUBCUTANEOUS
  Filled 2023-03-21 (×5): qty 0.1

## 2023-03-21 MED ORDER — LAMOTRIGINE 25 MG PO TABS
50.0000 mg | ORAL_TABLET | Freq: Two times a day (BID) | ORAL | Status: DC
  Administered 2023-03-21 – 2023-03-23 (×5): 50 mg via ORAL
  Filled 2023-03-21 (×5): qty 2

## 2023-03-21 MED ORDER — ACETAMINOPHEN 325 MG PO TABS: 650.0000 mg | ORAL_TABLET | Freq: Four times a day (QID) | ORAL | Status: DC | PRN

## 2023-03-21 MED ORDER — POLYETHYLENE GLYCOL 3350 17 G PO PACK: 17.0000 g | PACK | Freq: Every day | ORAL | Status: DC | PRN

## 2023-03-21 MED ORDER — ASPIRIN 81 MG PO CHEW
81.0000 mg | CHEWABLE_TABLET | Freq: Every morning | ORAL | Status: DC
  Administered 2023-03-21 – 2023-03-23 (×3): 81 mg via ORAL
  Filled 2023-03-21 (×3): qty 1

## 2023-03-21 MED ORDER — PANTOPRAZOLE SODIUM 40 MG PO TBEC
40.0000 mg | DELAYED_RELEASE_TABLET | Freq: Every day | ORAL | Status: DC
  Administered 2023-03-21 – 2023-03-23 (×3): 40 mg via ORAL
  Filled 2023-03-21 (×3): qty 1

## 2023-03-21 MED ORDER — BOOST / RESOURCE BREEZE PO LIQD CUSTOM
1.0000 | Freq: Three times a day (TID) | ORAL | Status: DC
  Administered 2023-03-22: 1 via ORAL

## 2023-03-21 MED ORDER — INSULIN ASPART 100 UNIT/ML IJ SOLN
3.0000 [IU] | Freq: Three times a day (TID) | INTRAMUSCULAR | Status: DC
  Administered 2023-03-21 – 2023-03-22 (×2): 3 [IU] via SUBCUTANEOUS

## 2023-03-21 MED ORDER — METOPROLOL TARTRATE 25 MG PO TABS
25.0000 mg | ORAL_TABLET | Freq: Two times a day (BID) | ORAL | Status: DC
  Administered 2023-03-21 – 2023-03-23 (×5): 25 mg via ORAL
  Filled 2023-03-21 (×5): qty 1

## 2023-03-21 MED ORDER — MELATONIN 5 MG PO TABS: 5.0000 mg | ORAL_TABLET | Freq: Every evening | ORAL | Status: DC | PRN

## 2023-03-21 MED ORDER — FENOFIBRATE 160 MG PO TABS
160.0000 mg | ORAL_TABLET | Freq: Every day | ORAL | Status: DC
  Administered 2023-03-21 – 2023-03-23 (×3): 160 mg via ORAL
  Filled 2023-03-21 (×3): qty 1

## 2023-03-21 MED ORDER — INSULIN ASPART 100 UNIT/ML IJ SOLN
0.0000 [IU] | Freq: Three times a day (TID) | INTRAMUSCULAR | Status: DC
  Administered 2023-03-21: 5 [IU] via SUBCUTANEOUS
  Administered 2023-03-21: 3 [IU] via SUBCUTANEOUS
  Administered 2023-03-21: 2 [IU] via SUBCUTANEOUS
  Administered 2023-03-21: 8 [IU] via SUBCUTANEOUS
  Administered 2023-03-22 (×2): 3 [IU] via SUBCUTANEOUS
  Administered 2023-03-22: 5 [IU] via SUBCUTANEOUS
  Administered 2023-03-23: 2 [IU] via SUBCUTANEOUS
  Administered 2023-03-23: 8 [IU] via SUBCUTANEOUS

## 2023-03-21 MED ORDER — SODIUM CHLORIDE 0.9 % IV SOLN: INTRAVENOUS | Status: AC

## 2023-03-21 NOTE — Progress Notes (Signed)
Chart reviewed. 82 yo patient from cardiac standpoint hisotry of HTN,HLD, chronic LBBB  followed by Dr Elease Hashimoto as outpatient admitted with generalized weakness, hyperglycemia/HONK, hypovolemia. Recent admission with pancreatitis. Admitted for management of HONK. Found to have new diagnosis of afib. Has been on lopressor 25mg  bid at home already, on this regimen his afib has been well rate controlled. CHADS2Vasc score is at least 4, would start eliquis 5mg  bid if no contraindication from primary team standpoint. F/u echo, pending new findings would not be any additional inpatient cardiology recs and patient could just f/u with Dr Elease Hashimoto as outpatient.   We will f/u echo. At this time no additional recs beyond anticoagulation as outlined above. We will f/u echo   Dina Rich MD

## 2023-03-21 NOTE — H&P (Addendum)
History and Physical  Christian Ross NWG:[id number] DOB: April 04, 1941 DOA: 03/20/2023  Referring physician: Dr. Donnald Garre, EDP  PCP: Laurell Josephs, MD (Inactive)  Outpatient Specialists: None Patient coming from: Home  Chief Complaint: Generalized weakness, decreased appetite  HPI: Christian Ross is a 82 y.o. male with medical history significant for hypertension, hyperlipidemia, type 2 diabetes, BPH, recently diagnosed pancreatitis duodenitis, who presented to the ED with complaints of progressively worsening generalized weakness for the past 2 weeks.  Associated with poor appetite and poor oral intake.  No reported subjective fevers or chills.  In the ED, the patient was noted to be severely hyperglycemic with serum glucose greater than 500.  Also noted to be in new atrial fibrillation with rate controlled.  The patient received subcu insulin and home meds were restarted.  EDP requested admission for further evaluation and management of present condition.  Admitted by [facility], hospitalist service.  ED Course: Temperature 98.  BP 140/70, pulse 85, respiratory 18, saturation 96% on room air.  Lab studies notable for WBC 6.2, hemoglobin 12.2, platelet count 208.  Serum sodium 131, serum glucose 574, BUN 50, creatinine 1.47, albumin 3.4, GFR 48.  Troponin 12, 15.  Review of Systems: Review of systems as noted in the HPI. All other systems reviewed and are negative.   Past Medical History:  Diagnosis Date   Allergy    Aortic atherosclerosis (HCC)    Arthritis    Cancer (HCC)    skin pre cancer nose   Cough 08/2014   new x17mo, denies fever and shortness of breath   Diabetes mellitus without complication (HCC)    type 2    Diverticulitis    Erectile dysfunction    Hemorrhoids    History of skin cancer    basal cell removed left arm 09/07/14   Hx of colonic polyps    Hyperlipidemia    Hypertension    orthostatic   Hypertriglyceridemia    Kidney stones    Rotator cuff tear     left   Sleep apnea    cpap setting at 4    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 Anne Hahn   Vitamin D deficiency    Past Surgical History:  Procedure Laterality Date   COLONOSCOPY W/ BIOPSIES AND POLYPECTOMY  multiple, 2005, 08, 14   KNEE SURGERY  1960   right / for osgood- schlatter   NASAL SINUS SURGERY     RECTAL BIOPSY     SHOULDER ARTHROSCOPY WITH ROTATOR CUFF REPAIR AND SUBACROMIAL DECOMPRESSION Right 12/14/2019   Procedure: Right shoulder mini open rotator cuff repair, subacromial decompression,;  Surgeon: Jene Every, MD;  Location: [facility];  Service: Orthopedics;  Laterality: Right;  2 hrs Choice with Block   SHOULDER OPEN ROTATOR CUFF REPAIR Left 09/14/2014   Procedure: LEFT SHOULDER MINI OPEN ROTATOR CUFF REPAIR AND SUBACROMIAL DECOMPRESSION ;  Surgeon: Jene Every, MD;  Location: [facility];  Service: Orthopedics;  Laterality: Left;   TOTAL HIP ARTHROPLASTY Right 08/22/2021   Procedure: TOTAL HIP ARTHROPLASTY ANTERIOR APPROACH;  Surgeon: Samson Frederic, MD;  Location: [facility];  Service: Orthopedics;  Laterality: Right;  150    Social History:  reports that he has never smoked. He has never used smokeless tobacco. He reports current alcohol use. He reports that he does not use drugs.   Allergies  Allergen Reactions   Lisinopril Cough    Other Reaction(s): Not available    Family History  Problem Relation Age of Onset  Colon cancer Neg Hx    Colon polyps Neg Hx    Esophageal cancer Neg Hx    Rectal cancer Neg Hx    Stomach cancer Neg Hx       Prior to Admission medications   Medication Sig Start Date End Date Taking? Authorizing Provider  Apoaequorin (PREVAGEN PO) Take 1 tablet by mouth daily.   Yes [provider]  Ascorbic Acid (VITAMIN C) 1000 MG tablet Take 1,000 mg by mouth daily.   Yes [provider]  aspirin 81 MG chewable tablet Chew 1 tablet by mouth in the morning.   Yes [provider]  Berberine Chloride POWD Take 800 mg  by mouth every morning. 2 caps every morning   Yes [provider]  fenofibrate (TRICOR) 145 MG tablet TAKE 1 TABLET(145 MG) BY MOUTH DAILY Patient taking differently: Take 145 mg by mouth daily. 12/08/22  Yes Nahser, Deloris Ping, MD  glipiZIDE (GLUCOTROL XL) 10 MG 24 hr tablet Take 10 mg by mouth daily with breakfast.  11/08/18  Yes [provider]  Glucosamine-Chondroit-Vit C-Mn (GLUCOSAMINE 1500 COMPLEX) CAPS Take 2 tablets by mouth daily.   Yes [provider]  icosapent Ethyl (VASCEPA) 1 g capsule Take 2 capsules (2 g total) by mouth 2 (two) times daily. 11/24/22  Yes Nahser, Deloris Ping, MD  lamoTRIgine (LAMICTAL) 25 MG tablet Take 2 tablets (50 mg total) by mouth 2 (two) times daily. 08/21/22  Yes Glean Salvo, NP  metoprolol tartrate (LOPRESSOR) 25 MG tablet TAKE 1 TABLET TWICE A DAY 01/08/23  Yes Nahser, Deloris Ping, MD  omeprazole (PRILOSEC) 20 MG capsule Take 1 capsule (20 mg total) by mouth daily. 30 minutes before breakfast 01/08/22  Yes Lemmon, Violet Baldy, PA  topiramate (TOPAMAX) 50 MG tablet Take 1 tablet (50 mg total) by mouth 2 (two) times daily. 08/21/22  Yes Glean Salvo, NP  Ubiquinol 200 MG CAPS Take 400 mg by mouth in the morning and at bedtime.    Yes [provider]  irbesartan (AVAPRO) 150 MG tablet Take 1 tablet (150 mg total) by mouth daily. 06/24/11 06/26/11  Nahser, Deloris Ping, MD    Physical Exam: BP 114/70 (BP Location: Left Arm)   Pulse 85   Temp 98 F (36.7 C) (Oral)   Resp 18   Wt 83.9 kg   SpO2 96%   BMI 25.80 kg/m   General: 82 y.o. year-old male frail-appearing in no acute distress.  Alert and oriented x3. Cardiovascular: Irregular rate and rhythm with no rubs or gallops.  No thyromegaly or JVD noted.  No lower extremity edema. 2/4 pulses in all 4 extremities. Respiratory: Clear to auscultation with no wheezes or rales. Good inspiratory effort. Abdomen: Soft nontender nondistended with normal bowel sounds x4  quadrants. Muskuloskeletal: No cyanosis, clubbing or edema noted bilaterally Neuro: CN II-XII intact, strength, sensation, reflexes Skin: No ulcerative lesions noted or rashes Psychiatry: Judgement and insight appear normal. Mood is appropriate for condition and setting          Labs on Admission:  Basic Metabolic Panel: Recent Labs  Lab 03/20/23 1129 03/21/23 0436  NA 131*  --   K 4.7  --   CL 96*  --   CO2 22  --   GLUCOSE 574*  --   BUN 50*  --   CREATININE 1.47* 1.45*  CALCIUM 9.9  --    Liver Function Tests: Recent Labs  Lab 03/20/23 1129  AST 21  ALT 26  ALKPHOS 66  BILITOT 1.0  PROT 7.5  ALBUMIN 3.4*   Recent Labs  Lab 03/20/23 1128  LIPASE 119*   No results for input(s): "AMMONIA" in the last 168 hours. CBC: Recent Labs  Lab 03/20/23 1128 03/21/23 0436  WBC 6.5 6.2  HGB 14.0 12.2*  HCT 42.6 37.1*  MCV 94.2 93.9  PLT 265 208   Cardiac Enzymes: No results for input(s): "CKTOTAL", "CKMB", "CKMBINDEX", "TROPONINI" in the last 168 hours.  BNP (last 3 results) No results for input(s): "BNP" in the last 8760 hours.  ProBNP (last 3 results) No results for input(s): "PROBNP" in the last 8760 hours.  CBG: Recent Labs  Lab 03/20/23 1742 03/20/23 1939 03/20/23 2108  GLUCAP 409* 352* 234*    Radiological Exams on Admission: DG Chest Port 1 View  Result Date: 03/20/2023 CLINICAL DATA:  Atrial fibrillation EXAM: PORTABLE CHEST 1 VIEW COMPARISON:  10/03/2021 FINDINGS: Shallow inspiration. Heart size and pulmonary vascularity are normal. Hazy linear opacities in the left base may represent atelectasis or infiltration. Right lung is clear. No pleural effusions. No pneumothorax. Mediastinal contours appear intact. IMPRESSION: Shallow inspiration with infiltration or atelectasis in the left base. Electronically Signed   By: Burman Nieves M.D.   On: 03/20/2023 20:07   CT ABDOMEN PELVIS W CONTRAST  Result Date: 03/20/2023 CLINICAL DATA:  Several week  history of decreased appetite with nausea and vomiting EXAM: CT ABDOMEN AND PELVIS WITH CONTRAST TECHNIQUE: Multidetector CT imaging of the abdomen and pelvis was performed using the standard protocol following bolus administration of intravenous contrast. RADIATION DOSE REDUCTION: This exam was performed according to the departmental dose-optimization program which includes automated exposure control, adjustment of the mA and/or kV according to patient size and/or use of iterative reconstruction technique. CONTRAST:  75mL OMNIPAQUE IOHEXOL 350 MG/ML SOLN COMPARISON:  CT abdomen and pelvis dated 03/02/2023 FINDINGS: Lower chest: No focal consolidation or pulmonary nodule in the lung bases. No pleural effusion or pneumothorax demonstrated. Left atrial enlargement. Coronary artery calcifications. Hepatobiliary: Subcentimeter hypodensity in segment 6/7 (3:26), too small to characterize. No intra or extrahepatic biliary ductal dilation. Normal gallbladder. Pancreas: Persistent but decreased peripancreatic stranding. Diffuse heterogeneous enhancement of the pancreas. No main pancreatic ductal dilation. Spleen: Normal in size without focal abnormality. Adrenals/Urinary Tract: No adrenal nodules. No suspicious renal masses or hydronephrosis. Bilateral nonobstructing renal stones measuring up to 3 mm in the left upper pole. No focal bladder wall thickening. Suboptimal evaluation of the right posterior bladder due to beam hardening artifact from hip arthroplasty. Stomach/Bowel: Normal appearance of the stomach. No evidence of bowel wall thickening, distention, or inflammatory changes. Colonic diverticulosis without acute diverticulitis. Normal appendix. Vascular/Lymphatic: Aortic atherosclerosis. No enlarged abdominal or pelvic lymph nodes. Reproductive: Prostate is unremarkable. Other: No free fluid, fluid collection, or free air. Musculoskeletal: No acute or abnormal lytic or blastic osseous lesions. Right hip  arthroplasty. Multilevel degenerative changes of the partially imaged thoracic and lumbar spine. Unchanged grade 1 anterolisthesis at L5-S1. IMPRESSION: 1. Persistent but decreased peripancreatic stranding, consistent with improving acute interstitial pancreatitis. No peripancreatic collection. 2. Bilateral nonobstructing nephrolithiasis. 3. Colonic diverticulosis without acute diverticulitis. 4. Aortic Atherosclerosis (ICD10-I70.0). Coronary artery calcifications. Assessment for potential risk factor modification, dietary therapy or pharmacologic therapy may be warranted, if clinically indicated. Electronically Signed   By: Agustin Cree M.D.   On: 03/20/2023 17:40    EKG: I independently viewed the EKG done and my findings are as followed: Atrial fibrillation rate of 74.  Nonspecific ST-T changes.  QTc 424.  Assessment/Plan Present on Admission: **None**  Principal Problem:   Generalized weakness  Generalized weakness, suspect multifactorial secondary to uncontrolled type 2 diabetes with hyperglycemia, dehydration Continue to treat underlying conditions Gentle IV fluid hydration Insulin sliding scale, long-acting insulin twice daily PT OT assessment Fall precautions.  New onset atrial fibrillation, rate controlled CHA2DS2-VASc score 4 Follow 2D echo Cardiology, PA Meng, consulted via secure chat Defer anticoagulation to cardiology  Recent history of pancreatitis duodenitis CT abdomen pelvis showing improvement of recent pancreatitis Denies having any abdominal pain Lipase 119, monitor for now  Type 2 diabetes with hyperglycemia Obtain hemoglobin A1c Insulin sliding scale and long-acting insulin  GERD Resume home PPI  CKD 3B Baseline Avoid nephrotoxic agents, dehydration, and hypotension Monitor urine output Repeat renal function test in the morning   Time: 75 minutes.   DVT prophylaxis: Subcu Lovenox daily  Code Status: Full code  Family Communication: None at  bedside  Disposition Plan: Admitted to telemetry medical unit  Consults called: Cardiology.  Admission status: Inpatient status.   Status is: Inpatient The patient requires at least 2 midnights for further evaluation and treatment of present condition.   Darlin Drop MD [facility] Pager [phone]  If 7PM-7AM, please contact night-coverage [email or web address] Password [facility]  03/21/2023, 6:19 AM

## 2023-03-21 NOTE — Evaluation (Signed)
Occupational Therapy Evaluation Patient Details Name: Christian Ross MRN: [id number] DOB: 1941-02-16 Today's Date: 03/21/2023   History of Present Illness Christian Ross is an 82 y.o. male admitted with complaints of progressive weakness that started 2 weeks ago, blood glucose in the ED was greater than 500 also found to be in new onset A-fib rate controlled  past medical history significant for essential hypertension, hyperlipidemia diabetes mellitus type 2 recently diagnosed with pancreatitis and duodenitis.   Clinical Impression   Pt currently supervision level for functional ADL tasks, grooming, and mobility without assistive device.  Overall, balance is good with one episode of veering to the left in the hallway when walking without an assistive device.  HR stable throughout in the 80s-low 90s range.  Pt lives with his spouse and was independent prior to admission.  Feel he will benefit from short term acute care OT to reach independent/modified independent level for return home with his spouse.           Functional Status Assessment  Patient has had a recent decline in their functional status and demonstrates the ability to make significant improvements in function in a reasonable and predictable amount of time.  Equipment Recommendations  None recommended by OT       Precautions / Restrictions Precautions Precautions: Fall Restrictions Weight Bearing Restrictions: No      Mobility Bed Mobility Overal bed mobility: Modified Independent Bed Mobility: Sit to Supine       Sit to supine: Modified independent (Device/Increase time)        Transfers Overall transfer level: Needs assistance   Transfers: Sit to/from Stand, Bed to chair/wheelchair/BSC Sit to Stand: Supervision     Step pivot transfers: Supervision     General transfer comment: Pt ambulated in the hall without assitive device.  One episode of veering to the left but able to correct without  assistance.  He was able to pick up items from the floor with supervision as well.      Balance Overall balance assessment: Needs assistance Sitting-balance support: No upper extremity supported, Feet supported Sitting balance-Leahy Scale: Good     Standing balance support: No upper extremity supported, During functional activity Standing balance-Leahy Scale: Good                             ADL either performed or assessed with clinical judgement   ADL Overall ADL's : Needs assistance/impaired Eating/Feeding: Independent;Sitting   Grooming: Wash/dry hands;Oral care;Wash/dry face;Standing;Supervision/safety   Upper Body Bathing: Set up;Sitting   Lower Body Bathing: Set up;Sit to/from stand   Upper Body Dressing : Set up;Sitting   Lower Body Dressing: Set up;Sit to/from stand   Toilet Transfer: Supervision/safety;Ambulation   Toileting- Clothing Manipulation and Hygiene: Supervision/safety;Sit to/from stand       Functional mobility during ADLs: Supervision/safety (ambulation without assistive device) General ADL Comments: Pt's HR stable throughout in the upper 80s to low 90s range.  Slight decreased dynamic standing balance with veering to the left occasionally but able to self correct.     Vision Baseline Vision/History: 1 Wears glasses (for reading only) Ability to See in Adequate Light: 0 Adequate Patient Visual Report: No change from baseline Vision Assessment?: No apparent visual deficits     Perception Perception: Within Functional Limits       Praxis Praxis: WFL       Pertinent Vitals/Pain Pain Assessment Pain Assessment: No/denies pain  Extremity/Trunk Assessment Upper Extremity Assessment Upper Extremity Assessment: Overall WFL for tasks assessed   Lower Extremity Assessment Lower Extremity Assessment: Defer to PT evaluation   Cervical / Trunk Assessment Cervical / Trunk Assessment: Normal   Communication  Communication Communication: No apparent difficulties   Cognition Arousal: Alert Behavior During Therapy: WFL for tasks assessed/performed Overall Cognitive Status: Within Functional Limits for tasks assessed (for basic orientation and problem solving)                                 General Comments: Pt able to state 2/3 words after 2 min delay.  Oriented to place, time, situation.  States he feels like he has progressively had some trouble with his memory over the past few months and is aware of it stating he used to do a lot of "day trading" online and now has stopped as he feels he is not safe with it.                Home Living Family/patient expects to be discharged to:: Private residence Living Arrangements: Spouse/significant other Available Help at Discharge: Available 24 hours/day Type of Home: House Home Access: Stairs to enter [employer] of Steps: None at the back, 3 at the front   Home Layout: Multi-level (split level house) Alternate Level Stairs-Number of Steps: pt has stair lifts on all stairs   Bathroom Shower/Tub: Producer, television/film/video: Handicapped height (comfort height)     Home Equipment: Cane - single Librarian, academic (2 wheels);Shower seat - built in;Grab bars - tub/shower (suction bar grab bars)          Prior Functioning/Environment Prior Level of Function : Independent/Modified Independent             Mobility Comments: Walks without an assistive device          OT Problem List: Impaired balance (sitting and/or standing)      OT Treatment/Interventions: Self-care/ADL training;Patient/family education;Energy conservation;Balance training;Therapeutic activities;DME and/or AE instruction;Therapeutic exercise    OT Goals(Current goals can be found in the care plan section) Acute Rehab OT Goals Patient Stated Goal: Get to feeling better OT Goal Formulation: With patient Time For Goal Achievement:  04/03/23 Potential to Achieve Goals: Good  OT Frequency: Min 1X/week       AM-PAC OT "6 Clicks" Daily Activity     Outcome Measure Help from another person eating meals?: None Help from another person taking care of personal grooming?: A Little Help from another person toileting, which includes using toliet, bedpan, or urinal?: A Little Help from another person bathing (including washing, rinsing, drying)?: A Little Help from another person to put on and taking off regular upper body clothing?: A Little Help from another person to put on and taking off regular lower body clothing?: A Little 6 Click Score: 19   End of Session Equipment Utilized During Treatment: Gait belt Nurse Communication: Mobility status  Activity Tolerance: Patient tolerated treatment well Patient left: in bed;with call bell/phone within reach  OT Visit Diagnosis: Unsteadiness on feet (R26.81)                Time: 8315-1761 OT Time Calculation (min): 35 min Charges:  OT General Charges $OT Visit: 1 Visit OT Evaluation $OT Eval Moderate Complexity: 1 Mod OT Treatments $Self Care/Home Management : 8-22 mins  Perrin Maltese, OTR/L Acute Rehabilitation Services  Office [phone] 03/21/2023

## 2023-03-21 NOTE — Progress Notes (Signed)
Echocardiogram 2D Echocardiogram has been performed.  Warren Lacy Isabela Nardelli RDCS 03/21/2023, 11:11 AM

## 2023-03-21 NOTE — Progress Notes (Signed)
[facility] PROGRESS NOTE    Progress Note  Christian Ross  NWG:[id number] DOB: Dec 16, 1940 DOA: 03/20/2023 PCP: Laurell Josephs, MD (Inactive)     Brief Narrative:   Christian Ross is an 82 y.o. male past medical history significant for essential hypertension, hyperlipidemia diabetes mellitus type 2 recently diagnosed with pancreatitis and duodenitis comes in complaining of progressive weakness that started 2 weeks ago, blood glucose in the ED was greater than 500 also found to be in new onset A-fib rate controlled   Assessment/Plan:   Type 2 diabetes mellitus with hyperosmolar hyperglycemic state (HHS) (HCC) Started on IV fluids and subcutaneous insulin. A1c of 11.9 he relates he has been taking his oral hypoglycemic agents. Continue current regimen blood glucose improving nicely. Continue IV fluids for an additional 24 hours. PT OT has been consulted.  New onset A-fib: Chest Vasc score 4 or greater 2D echo has been ordered. Cardiology has been consulted.  Continue metoprolol. Anticipate patient will need a DOAC upon discharge.  HTN (hypertension) Resume metoprolol.  Chronic kidney disease, stage 3 unspecified (HCC) Appears to be at baseline.  1.3-1.5.  History of pancreatitis Denies any abdominal pain.  Normocytic anemia: Close at baseline follow-up as an outpatient.    DVT prophylaxis: lovenox Family Communication:none Status is: Inpatient Remains inpatient appropriate because: Hyperglycemic hyperosmolar state.    Code Status:     Code Status Orders  (From admission, onward)           Start     Ordered   03/21/23 0206  Full code  Continuous       Question:  By:  Answer:  Consent: discussion documented in EHR   03/21/23 0205           Code Status History     Date Active Date Inactive Code Status Order ID Comments User Context   09/14/2014 1141 09/15/2014 1401 Full Code [id number]  Jene Every, MD Inpatient      Advance  Directive Documentation    Flowsheet Row Most Recent Value  Type of Advance Directive Healthcare Power of Attorney, Living will  Pre-existing out of facility DNR order (yellow form or pink MOST form) --  "MOST" Form in Place? --         IV Access:   Peripheral IV   Procedures and diagnostic studies:   DG Chest Port 1 View  Result Date: 03/20/2023 CLINICAL DATA:  Atrial fibrillation EXAM: PORTABLE CHEST 1 VIEW COMPARISON:  10/03/2021 FINDINGS: Shallow inspiration. Heart size and pulmonary vascularity are normal. Hazy linear opacities in the left base may represent atelectasis or infiltration. Right lung is clear. No pleural effusions. No pneumothorax. Mediastinal contours appear intact. IMPRESSION: Shallow inspiration with infiltration or atelectasis in the left base. Electronically Signed   By: Burman Nieves M.D.   On: 03/20/2023 20:07   CT ABDOMEN PELVIS W CONTRAST  Result Date: 03/20/2023 CLINICAL DATA:  Several week history of decreased appetite with nausea and vomiting EXAM: CT ABDOMEN AND PELVIS WITH CONTRAST TECHNIQUE: Multidetector CT imaging of the abdomen and pelvis was performed using the standard protocol following bolus administration of intravenous contrast. RADIATION DOSE REDUCTION: This exam was performed according to the departmental dose-optimization program which includes automated exposure control, adjustment of the mA and/or kV according to patient size and/or use of iterative reconstruction technique. CONTRAST:  75mL OMNIPAQUE IOHEXOL 350 MG/ML SOLN COMPARISON:  CT abdomen and pelvis dated 03/02/2023 FINDINGS: Lower chest: No focal consolidation or pulmonary nodule in the lung  bases. No pleural effusion or pneumothorax demonstrated. Left atrial enlargement. Coronary artery calcifications. Hepatobiliary: Subcentimeter hypodensity in segment 6/7 (3:26), too small to characterize. No intra or extrahepatic biliary ductal dilation. Normal gallbladder. Pancreas:  Persistent but decreased peripancreatic stranding. Diffuse heterogeneous enhancement of the pancreas. No main pancreatic ductal dilation. Spleen: Normal in size without focal abnormality. Adrenals/Urinary Tract: No adrenal nodules. No suspicious renal masses or hydronephrosis. Bilateral nonobstructing renal stones measuring up to 3 mm in the left upper pole. No focal bladder wall thickening. Suboptimal evaluation of the right posterior bladder due to beam hardening artifact from hip arthroplasty. Stomach/Bowel: Normal appearance of the stomach. No evidence of bowel wall thickening, distention, or inflammatory changes. Colonic diverticulosis without acute diverticulitis. Normal appendix. Vascular/Lymphatic: Aortic atherosclerosis. No enlarged abdominal or pelvic lymph nodes. Reproductive: Prostate is unremarkable. Other: No free fluid, fluid collection, or free air. Musculoskeletal: No acute or abnormal lytic or blastic osseous lesions. Right hip arthroplasty. Multilevel degenerative changes of the partially imaged thoracic and lumbar spine. Unchanged grade 1 anterolisthesis at L5-S1. IMPRESSION: 1. Persistent but decreased peripancreatic stranding, consistent with improving acute interstitial pancreatitis. No peripancreatic collection. 2. Bilateral nonobstructing nephrolithiasis. 3. Colonic diverticulosis without acute diverticulitis. 4. Aortic Atherosclerosis (ICD10-I70.0). Coronary artery calcifications. Assessment for potential risk factor modification, dietary therapy or pharmacologic therapy may be warranted, if clinically indicated. Electronically Signed   By: Agustin Cree M.D.   On: 03/20/2023 17:40     Medical Consultants:   None.   Subjective:    Christian Ross denies any chest pain or shortness of breath.  Objective:    Vitals:   03/20/23 2133 03/20/23 2200 03/21/23 0500 03/21/23 0510  BP: (!) 140/93   114/70  Pulse: 82   85  Resp: 18   18  Temp: (!) 97.5 F (36.4 C)   98 F (36.7  C)  TempSrc: Oral   Oral  SpO2: 98%   96%  Weight:  85.5 kg 83.9 kg    SpO2: 96 %   Intake/Output Summary (Last 24 hours) at 03/21/2023 0653 Last data filed at 03/21/2023 0525 Gross per 24 hour  Intake 1000 ml  Output 450 ml  Net 550 ml   Filed Weights   03/20/23 2200 03/21/23 0500  Weight: 85.5 kg 83.9 kg    Exam: General exam: In no acute distress. Respiratory system: Good air movement and clear to auscultation. Cardiovascular system: S1 & S2 heard, RRR. No JVD. Gastrointestinal system: Abdomen is nondistended, soft and nontender.  Extremities: No pedal edema. Skin: No rashes, lesions or ulcers Psychiatry: Judgement and insight appear normal. Mood & affect appropriate.    Data Reviewed:    Labs: Basic Metabolic Panel: Recent Labs  Lab 03/20/23 1129 03/21/23 0436  NA 131*  --   K 4.7  --   CL 96*  --   CO2 22  --   GLUCOSE 574*  --   BUN 50*  --   CREATININE 1.47* 1.45*  CALCIUM 9.9  --    GFR Estimated Creatinine Clearance: 43.3 mL/min (A) (by C-G formula based on SCr of 1.45 mg/dL (H)). Liver Function Tests: Recent Labs  Lab 03/20/23 1129  AST 21  ALT 26  ALKPHOS 66  BILITOT 1.0  PROT 7.5  ALBUMIN 3.4*   Recent Labs  Lab 03/20/23 1128  LIPASE 119*   No results for input(s): "AMMONIA" in the last 168 hours. Coagulation profile No results for input(s): "INR", "PROTIME" in the last 168 hours. COVID-19 Labs  No  results for input(s): "DDIMER", "FERRITIN", "LDH", "CRP" in the last 72 hours.  Lab Results  Component Value Date   SARSCOV2NAA NEGATIVE 12/10/2019    CBC: Recent Labs  Lab 03/20/23 1128 03/21/23 0436  WBC 6.5 6.2  HGB 14.0 12.2*  HCT 42.6 37.1*  MCV 94.2 93.9  PLT 265 208   Cardiac Enzymes: No results for input(s): "CKTOTAL", "CKMB", "CKMBINDEX", "TROPONINI" in the last 168 hours. BNP (last 3 results) No results for input(s): "PROBNP" in the last 8760 hours. CBG: Recent Labs  Lab 03/20/23 1742 03/20/23 1939  03/20/23 2108  GLUCAP 409* 352* 234*   D-Dimer: No results for input(s): "DDIMER" in the last 72 hours. Hgb A1c: Recent Labs    03/21/23 0436  HGBA1C 11.9*   Lipid Profile: No results for input(s): "CHOL", "HDL", "LDLCALC", "TRIG", "CHOLHDL", "LDLDIRECT" in the last 72 hours. Thyroid function studies: No results for input(s): "TSH", "T4TOTAL", "T3FREE", "THYROIDAB" in the last 72 hours.  Invalid input(s): "FREET3" Anemia work up: No results for input(s): "VITAMINB12", "FOLATE", "FERRITIN", "TIBC", "IRON", "RETICCTPCT" in the last 72 hours. Sepsis Labs: Recent Labs  Lab 03/20/23 1128 03/21/23 0436  WBC 6.5 6.2   Microbiology No results found for this or any previous visit (from the past 240 hour(s)).   Medications:    aspirin  81 mg Oral q AM   enoxaparin (LOVENOX) injection  40 mg Subcutaneous Daily   fenofibrate  160 mg Oral Daily   icosapent Ethyl  2 g Oral BID   insulin aspart  0-15 Units Subcutaneous TID WC   insulin aspart  0-5 Units Subcutaneous QHS   insulin glargine-yfgn  10 Units Subcutaneous BID   lamoTRIgine  50 mg Oral BID   pantoprazole  40 mg Oral Daily   topiramate  50 mg Oral BID   Continuous Infusions:  sodium chloride 50 mL/hr at 03/21/23 0247      LOS: 1 day   Marinda Elk  [facility]  03/21/2023, 6:53 AM

## 2023-03-21 NOTE — Evaluation (Signed)
Physical Therapy Brief Evaluation and Discharge Note Patient Details Name: Christian Ross MRN: [id number] DOB: 12-12-1940 Today's Date: 03/21/2023   History of Present Illness  Christian Ross is an 82 y.o. male admitted with complaints of progressive weakness that started 2 weeks ago, blood glucose in the ED was greater than 500 also found to be in new onset A-fib rate controlled  past medical history significant for essential hypertension, hyperlipidemia diabetes mellitus type 2 recently diagnosed with pancreatitis and duodenitis.  Clinical Impression  Pt presents with admitting diagnosis above. Pt today was able to ambulate in hallway and navigate stairs at supervision level. Pt did have 1 LOB noted in hallway however was able to self correct. Pt reports balance issues are baseline for him however has had no falls at home. Pt presents at or near baseline mobility. Pt has no further acute PT needs and will be signing off. Recommend OPPT upon DC for balance deficits. Re consult PT if mobility status changes. Pt would benefit from continued mobility with mobility specialist during acute stay.      PT Assessment All further PT needs can be met in the next venue of care  Assistance Needed at Discharge  PRN    Equipment Recommendations None recommended by PT  Recommendations for Other Services       Precautions/Restrictions Precautions Precautions: Fall Restrictions Weight Bearing Restrictions: No        Mobility  Bed Mobility   Supine/Sidelying to sit: Modified independent (Device/Increased time) Sit to supine/sidelying: Modified independent (Device/Increased time)    Transfers Overall transfer level: Independent   Transfers: Sit to/from Stand Sit to Stand: Independent                Ambulation/Gait Ambulation/Gait assistance: Supervision, Contact guard assist Gait Distance (Feet): 200 Feet Assistive device: None Gait Pattern/deviations: Decreased stride  length, Step-through pattern, Staggering left Gait Speed: Pace WFL General Gait Details: 1 episode of pt losing balance however mostly able to self correct. Pt reports balance issues are baseline for him.  Home Activity Instructions    Stairs Stairs: Yes Stairs assistance: Supervision Stair Management: Alternating pattern, Forwards, Backwards, Two rails Number of Stairs: 5 General stair comments: limited by IV line however no LOB noted. pt states he comes down the steps backwards at home.  Modified Rankin (Stroke Patients Only)        Balance   Sitting-balance support: No upper extremity supported, Feet supported Sitting balance-Leahy Scale: Good     Standing balance support: No upper extremity supported, During functional activity Standing balance-Leahy Scale: Good            Pertinent Vitals/Pain PT - Brief Vital Signs All Vital Signs Stable: Yes Pain Assessment Pain Assessment: No/denies pain     Home Living Family/patient expects to be discharged to:: Private residence Living Arrangements: Spouse/significant other Available Help at Discharge: Family;Available 24 hours/day Home Environment: Other (comment) (None in the back but 3 in the front)   Home Equipment: Cane - single point;Rolling Walker (2 wheels);Shower seat - built in;Grab bars - tub/shower (suction bar grab bars)        Prior Function Level of Independence: Independent      UE/LE Assessment   UE ROM/Strength/Tone/Coordination: WFL    LE ROM/Strength/Tone/Coordination: WFL      Communication   Communication Communication: No apparent difficulties     Cognition Overall Cognitive Status: Appears within functional limits for tasks assessed/performed       General Comments General comments (  skin integrity, edema, etc.): VSS    Exercises     Assessment/Plan    PT Problem List Decreased strength;Decreased range of motion;Decreased activity tolerance;Decreased mobility;Decreased  balance;Decreased coordination;Decreased knowledge of use of DME;Decreased knowledge of precautions;Decreased safety awareness;Cardiopulmonary status limiting activity       PT Visit Diagnosis Other abnormalities of gait and mobility (R26.89)    No Skilled PT Patient at baseline level of functioning;Patient is independent with all acitivity/mobility   Co-evaluation                AMPAC 6 Clicks Help needed turning from your back to your side while in a flat bed without using bedrails?: None Help needed moving from lying on your back to sitting on the side of a flat bed without using bedrails?: None Help needed moving to and from a bed to a chair (including a wheelchair)?: None Help needed standing up from a chair using your arms (e.g., wheelchair or bedside chair)?: None Help needed to walk in hospital room?: A Little Help needed climbing 3-5 steps with a railing? : A Little 6 Click Score: 22      End of Session Equipment Utilized During Treatment: Gait belt Activity Tolerance: Patient tolerated treatment well Patient left: in bed;with call bell/phone within reach Nurse Communication: Mobility status PT Visit Diagnosis: Other abnormalities of gait and mobility (R26.89)     Time: 7829-5621 PT Time Calculation (min) (ACUTE ONLY): 10 min  Charges:   PT Evaluation $PT Eval Low Complexity: 1 Low      Shela Nevin, PT, DPT Acute Rehab Services [phone]   Gladys Damme  03/21/2023, 3:27 PM

## 2023-03-22 DIAGNOSIS — E1100 Type 2 diabetes mellitus with hyperosmolarity without nonketotic hyperglycemic-hyperosmolar coma (NKHHC): Secondary | ICD-10-CM | POA: Diagnosis not present

## 2023-03-22 DIAGNOSIS — I4891 Unspecified atrial fibrillation: Secondary | ICD-10-CM

## 2023-03-22 LAB — BASIC METABOLIC PANEL
Anion gap: 4 — ABNORMAL LOW (ref 5–15)
BUN: 21 mg/dL (ref 8–23)
CO2: 23 mmol/L (ref 22–32)
Calcium: 8.2 mg/dL — ABNORMAL LOW (ref 8.9–10.3)
Chloride: 106 mmol/L (ref 98–111)
Creatinine, Ser: 1.19 mg/dL (ref 0.61–1.24)
GFR, Estimated: >60 mL/min (ref >60)
Glucose, Bld: 227 mg/dL — ABNORMAL HIGH (ref 70–99)
Potassium: 3.6 mmol/L (ref 3.5–5.1)
Sodium: 133 mmol/L — ABNORMAL LOW (ref 135–145)

## 2023-03-22 LAB — GLUCOSE, CAPILLARY
Glucose-Capillary: 243 mg/dL — ABNORMAL HIGH (ref 70–99)
Glucose-Capillary: 168 mg/dL — ABNORMAL HIGH (ref 70–99)
Glucose-Capillary: 199 mg/dL — ABNORMAL HIGH (ref 70–99)
Glucose-Capillary: 305 mg/dL — ABNORMAL HIGH (ref 70–99)

## 2023-03-22 MED ORDER — APIXABAN 5 MG PO TABS
5.0000 mg | ORAL_TABLET | Freq: Two times a day (BID) | ORAL | Status: DC
  Administered 2023-03-22 – 2023-03-23 (×2): 5 mg via ORAL
  Filled 2023-03-22 (×2): qty 1

## 2023-03-22 MED ORDER — INSULIN ASPART 100 UNIT/ML IJ SOLN
5.0000 [IU] | Freq: Three times a day (TID) | INTRAMUSCULAR | Status: DC
  Administered 2023-03-22 – 2023-03-23 (×4): 5 [IU] via SUBCUTANEOUS

## 2023-03-22 MED ORDER — METOPROLOL TARTRATE 25 MG PO TABS: 25.0000 mg | ORAL_TABLET | Freq: Two times a day (BID) | ORAL | Status: DC

## 2023-03-22 MED ORDER — INSULIN ASPART 100 UNIT/ML IJ SOLN
6.0000 [IU] | Freq: Three times a day (TID) | INTRAMUSCULAR | Status: DC
Start: 2023-03-22 — End: 2023-03-22

## 2023-03-22 MED ORDER — INSULIN GLARGINE-YFGN 100 UNIT/ML ~~LOC~~ SOLN
15.0000 [IU] | Freq: Two times a day (BID) | SUBCUTANEOUS | Status: DC
  Administered 2023-03-22 – 2023-03-23 (×2): 15 [IU] via SUBCUTANEOUS
  Filled 2023-03-22 (×3): qty 0.15

## 2023-03-22 MED ORDER — GLIPIZIDE ER 10 MG PO TB24
10.0000 mg | ORAL_TABLET | Freq: Every day | ORAL | Status: DC
  Administered 2023-03-23: 10 mg via ORAL
  Filled 2023-03-22: qty 1

## 2023-03-22 MED ORDER — INSULIN ASPART 100 UNIT/ML IJ SOLN: 3.0000 [IU] | Freq: Three times a day (TID) | INTRAMUSCULAR | Status: DC

## 2023-03-22 NOTE — Plan of Care (Signed)
Patient alert and oriented x4. Tolerated advancement of diet from clear liquid to heart healthy. No complaints of pain during shift. Ambulated with assistance in hallway x1. Pt informed RN he will discharge with insulin. RN educated pt on common injection sites and to rotate sites. Pt agreeable.   Problem: Education: Goal: Knowledge of General Education information will improve Description: Including pain rating scale, medication(s)/side effects and non-pharmacologic comfort measures Outcome: Progressing   Problem: Health Behavior/Discharge Planning: Goal: Ability to manage health-related needs will improve Outcome: Progressing   Problem: Clinical Measurements: Goal: Respiratory complications will improve Outcome: Progressing   Problem: Activity: Goal: Risk for activity intolerance will decrease Outcome: Progressing   Problem: Nutrition: Goal: Adequate nutrition will be maintained Outcome: Progressing   Problem: Coping: Goal: Level of anxiety will decrease Outcome: Progressing   Problem: Elimination: Goal: Will not experience complications related to bowel motility Outcome: Progressing Goal: Will not experience complications related to urinary retention Outcome: Progressing

## 2023-03-22 NOTE — Progress Notes (Signed)
Mobility Specialist Progress Note;   03/22/23 1150  Mobility  Activity Ambulated with assistance in hallway  Level of Assistance Contact guard assist, steadying assist  Assistive Device None  Distance Ambulated (ft) 305 ft  Activity Response Tolerated well  Mobility Referral Yes  $Mobility charge 1 Mobility  Mobility Specialist Start Time (ACUTE ONLY) 1150  Mobility Specialist Stop Time (ACUTE ONLY) 1205  Mobility Specialist Time Calculation (min) (ACUTE ONLY) 15 min   Pt agreeable to mobility. Required SV for STS and MinG assistance during ambulation. 2 minor LOB while ambulating, however pt was able to self correct. No c/o during session. Pt returned back to bed with all needs met.   Caesar Bookman Mobility Specialist Please contact via SecureChat or Rehab Office [phone]

## 2023-03-22 NOTE — Progress Notes (Addendum)
PHARMACY - ANTICOAGULATION CONSULT NOTE  Pharmacy Consult for Eliquis Indication: New Onset Atrial Fibrillation  Allergies  Allergen Reactions   Lisinopril Cough    Other Reaction(s): Not available    Patient Measurements: Weight: 87 kg (191 lb 12.8 oz)  Vital Signs: Temp: 98.3 F (36.8 C) (12/01 0745) Temp Source: Oral (12/01 0745) BP: 112/71 (12/01 0745) Pulse Rate: 76 (12/01 0745)  Labs: Recent Labs    03/20/23 1128 03/20/23 1129 03/20/23 1953 03/20/23 2156 03/21/23 0436 03/21/23 0729 03/22/23 0524  HGB 14.0  --   --   --  12.2*  --   --   HCT 42.6  --   --   --  37.1*  --   --   PLT 265  --   --   --  208  --   --   CREATININE  --    < >  --   --  1.45* 1.37* 1.19  TROPONINIHS  --   --  12 15  --   --   --    < > = values in this interval not displayed.    Estimated Creatinine Clearance: 52.7 mL/min (by C-G formula based on SCr of 1.19 mg/dL).   Medical History: Past Medical History:  Diagnosis Date   Allergy    Aortic atherosclerosis (HCC)    Arthritis    Cancer (HCC)    skin pre cancer nose   Cough 08/2014   new x33mo, denies fever and shortness of breath   Diabetes mellitus without complication (HCC)    type 2    Diverticulitis    Erectile dysfunction    Hemorrhoids    History of skin cancer    basal cell removed left arm 09/07/14   Hx of colonic polyps    Hyperlipidemia    Hypertension    orthostatic   Hypertriglyceridemia    Kidney stones    Rotator cuff tear    left   Sleep apnea    cpap setting at 4    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 Anne Hahn   Vitamin D deficiency     Medications:  Medications Prior to Admission  Medication Sig Dispense Refill Last Dose   Apoaequorin (PREVAGEN PO) Take 1 tablet by mouth daily.   03/19/2023   aspirin 81 MG chewable tablet Chew 1 tablet by mouth in the morning.   03/19/2023   fenofibrate (TRICOR) 145 MG tablet TAKE 1 TABLET(145 MG) BY MOUTH DAILY (Patient taking differently: Take 145 mg by  mouth daily.) 90 tablet 3 Past Week   glipiZIDE (GLUCOTROL XL) 10 MG 24 hr tablet Take 10 mg by mouth daily with breakfast.    Past Week   Glucosamine-Chondroit-Vit C-Mn (GLUCOSAMINE 1500 COMPLEX) CAPS Take 2 tablets by mouth daily.   Past Week   icosapent Ethyl (VASCEPA) 1 g capsule Take 2 capsules (2 g total) by mouth 2 (two) times daily. 360 capsule 1 Past Week   lamoTRIgine (LAMICTAL) 25 MG tablet Take 2 tablets (50 mg total) by mouth 2 (two) times daily. 360 tablet 3 03/19/2023   metoprolol tartrate (LOPRESSOR) 25 MG tablet TAKE 1 TABLET TWICE A DAY 180 tablet 3 03/19/2023   omeprazole (PRILOSEC) 20 MG capsule Take 1 capsule (20 mg total) by mouth daily. 30 minutes before breakfast 90 capsule 3 Past Month   topiramate (TOPAMAX) 50 MG tablet Take 1 tablet (50 mg total) by mouth 2 (two) times daily. 180 tablet 3 03/19/2023   Ubiquinol  200 MG CAPS Take 400 mg by mouth in the morning and at bedtime.    unknown   Scheduled:   aspirin  81 mg Oral q AM   enoxaparin (LOVENOX) injection  40 mg Subcutaneous Daily   feeding supplement  1 Container Oral TID BM   fenofibrate  160 mg Oral Daily   [START ON 03/23/2023] glipiZIDE  10 mg Oral Q breakfast   icosapent Ethyl  2 g Oral BID   insulin aspart  0-15 Units Subcutaneous TID WC   insulin aspart  0-5 Units Subcutaneous QHS   insulin aspart  5 Units Subcutaneous TID WC   insulin glargine-yfgn  15 Units Subcutaneous BID   lamoTRIgine  50 mg Oral BID   metoprolol tartrate  25 mg Oral BID   pantoprazole  40 mg Oral Daily   topiramate  50 mg Oral BID   Infusions:   Assessment: 82 YOM admitted on 11/29 for new onset atrial fibrillation. CHA2DS2-VASc Score of 4, initiating Eliquis today per MD request. Patient was started on lovenox 40 mg every 24 hours this admission for DVT prophylaxis, last dose 12/1 AM.   12/1: CBC stable (Hgb 12.2, PLT 208). No signs of bleeding noted.   Goal of Therapy:  Monitor platelets by anticoagulation protocol: Yes    Plan:  Start Eliquis 5 mg PO BID (Age 82, Weight 87 kg, Scr 1.19) Monitor CBC and signs of bleeding  Enos Fling, PharmD PGY-1 Acute Care Pharmacy Resident 03/22/2023 11:55 AM

## 2023-03-22 NOTE — Plan of Care (Signed)
  Problem: Health Behavior/Discharge Planning: Goal: Ability to manage health-related needs will improve Outcome: Progressing   Problem: Clinical Measurements: Goal: Ability to maintain clinical measurements within normal limits will improve Outcome: Progressing   Problem: Clinical Measurements: Goal: Will remain free from infection Outcome: Progressing   Problem: Clinical Measurements: Goal: Diagnostic test results will improve Outcome: Progressing   

## 2023-03-22 NOTE — Progress Notes (Signed)
[facility] PROGRESS NOTE    Progress Note  Christian Ross  GEX:[id number] DOB: 1940/09/11 DOA: 03/20/2023 PCP: Laurell Josephs, MD (Inactive)     Brief Narrative:   BANNER HOLSOPPLE is an 82 y.o. male past medical history significant for essential hypertension, hyperlipidemia diabetes mellitus type 2 recently diagnosed with pancreatitis and duodenitis comes in complaining of progressive weakness that started 2 weeks ago, blood glucose in the ED was greater than 500 also found to be in new onset A-fib rate controlled   Assessment/Plan:   Type 2 diabetes mellitus with hyperosmolar hyperglycemic state (HHS) (HCC) A1c of 11.9 he relates he has been taking his oral hypoglycemic agents religiously, daughter and wife comfort. Blood glucose trending high increase long-acting insulin, continue long-acting insulin, might need metformin to go home.   He will definitely need long-acting insulin to go home with. Blood glucose elevated. Continue CBGs before meals and at bedtime. KVO IV fluids. Physical therapy evaluated the patient recommended home health PT.  New onset A-fib: Chest Vasc score 4 or greater 2D echo showed an EF of 45% global hypokinesia moderate symmetrical left ventricular hypertrophy no wall motion abnormality, mildly reduced systolic function.  Aortic valve showed sclerosis without stenosis. Cardiology has been consulted recommended to continue metoprolol and start Eliquis. Awaiting further recommendations.  HTN (hypertension) Resume metoprolol.  New acute kidney injury chronic kidney disease, stage 3 unspecified (HCC) With a baseline creatinine of around 1, likely prerenal azotemia was started on IV fluids and his creatinine has returned to baseline.  History of pancreatitis Denies any abdominal pain.  Normocytic anemia: Close at baseline follow-up as an outpatient.  Hyponatremia:    DVT prophylaxis: lovenox Family Communication:none Status is:  Inpatient Remains inpatient appropriate because: Hyperglycemic hyperosmolar state.    Code Status:     Code Status Orders  (From admission, onward)           Start     Ordered   03/21/23 0206  Full code  Continuous       Question:  By:  Answer:  Consent: discussion documented in EHR   03/21/23 0205           Code Status History     Date Active Date Inactive Code Status Order ID Comments User Context   09/14/2014 1141 09/15/2014 1401 Full Code [id number]  Jene Every, MD Inpatient      Advance Directive Documentation    Flowsheet Row Most Recent Value  Type of Advance Directive Healthcare Power of Attorney, Living will  Pre-existing out of facility DNR order (yellow form or pink MOST form) --  "MOST" Form in Place? --         IV Access:   Peripheral IV   Procedures and diagnostic studies:   ECHOCARDIOGRAM COMPLETE  Result Date: 03/21/2023    ECHOCARDIOGRAM REPORT   Patient Name:   Christian Ross Date of Exam: 03/21/2023 Medical Rec #:  [id number]          Height:       71.0 in Accession #:    [id number]         Weight:       185.0 lb Date of Birth:  02-26-1941          BSA:          2.040 m Patient Age:    82 years           BP:  123/82 mmHg Patient Gender: M                  HR:           74 bpm. Exam Location:  Inpatient Procedure: 2D Echo, Color Doppler and Cardiac Doppler Indications:    I48.91* Unspecified atrial fibrillation  History:        Patient has prior history of Echocardiogram examinations, most                 recent 11/16/2021. Arrythmias:Atrial Fibrillation; Risk                 Factors:Hypertension, Diabetes, Dyslipidemia and Sleep Apnea.  Sonographer:    Irving Burton Senior RDCS Referring Phys: [id number] CAROLE N HALL IMPRESSIONS  1. Left ventricular ejection fraction, by estimation, is 45 to 50%. The left ventricle has mildly decreased function. The left ventricle demonstrates global hypokinesis. There is moderate asymmetric left ventricular  hypertrophy of the basal-septal segment. Left ventricular diastolic function could not be evaluated.  2. Right ventricular systolic function is mildly reduced. The right ventricular size is mildly enlarged. There is normal pulmonary artery systolic pressure. The estimated right ventricular systolic pressure is 24.3 mmHg.  3. Right atrial size was mildly dilated.  4. The mitral valve is abnormal. Trivial mitral valve regurgitation.  5. The aortic valve is tricuspid. There is moderate calcification of the aortic valve. Aortic valve regurgitation is trivial. Aortic valve sclerosis/calcification is present, without any evidence of aortic stenosis.  6. The inferior vena cava is normal in size with greater than 50% respiratory variability, suggesting right atrial pressure of 3 mmHg. Comparison(s): The left ventricular function is worsened. FINDINGS  Left Ventricle: Left ventricular ejection fraction, by estimation, is 45 to 50%. The left ventricle has mildly decreased function. The left ventricle demonstrates global hypokinesis. The left ventricular internal cavity size was normal in size. There is  moderate asymmetric left ventricular hypertrophy of the basal-septal segment. Abnormal (paradoxical) septal motion, consistent with left bundle branch block. Left ventricular diastolic function could not be evaluated due to atrial fibrillation. Left ventricular diastolic function could not be evaluated. Right Ventricle: The right ventricular size is mildly enlarged. No increase in right ventricular wall thickness. Right ventricular systolic function is mildly reduced. There is normal pulmonary artery systolic pressure. The tricuspid regurgitant velocity  is 2.31 m/s, and with an assumed right atrial pressure of 3 mmHg, the estimated right ventricular systolic pressure is 24.3 mmHg. Left Atrium: Left atrial size was normal in size. Right Atrium: Right atrial size was mildly dilated. Pericardium: There is no evidence of  pericardial effusion. Presence of epicardial fat layer. Mitral Valve: The mitral valve is abnormal. Mild mitral annular calcification. Trivial mitral valve regurgitation. Tricuspid Valve: The tricuspid valve is grossly normal. Tricuspid valve regurgitation is mild . No evidence of tricuspid stenosis. Aortic Valve: The aortic valve is tricuspid. There is moderate calcification of the aortic valve. Aortic valve regurgitation is trivial. Aortic valve sclerosis/calcification is present, without any evidence of aortic stenosis. Aortic valve mean gradient measures 6.0 mmHg. Aortic valve peak gradient measures 10.4 mmHg. Aortic valve area, by VTI measures 1.90 cm. Pulmonic Valve: The pulmonic valve was grossly normal. Pulmonic valve regurgitation is trivial. No evidence of pulmonic stenosis. Aorta: The aortic root and ascending aorta are structurally normal, with no evidence of dilitation. Venous: The inferior vena cava is normal in size with greater than 50% respiratory variability, suggesting right atrial pressure of 3 mmHg. IAS/Shunts: The atrial septum  is grossly normal.  LEFT VENTRICLE PLAX 2D LVIDd:         3.60 cm LVIDs:         3.00 cm LV PW:         0.90 cm LV IVS:        1.40 cm LVOT diam:     2.30 cm LV SV:         60 LV SV Index:   30 LVOT Area:     4.15 cm  LV Volumes (MOD) LV vol d, MOD A2C: 76.7 ml LV vol d, MOD A4C: 78.7 ml LV vol s, MOD A2C: 43.3 ml LV vol s, MOD A4C: 43.9 ml LV SV MOD A2C:     33.4 ml LV SV MOD A4C:     78.7 ml LV SV MOD BP:      34.7 ml RIGHT VENTRICLE RV S prime:     10.40 cm/s TAPSE (M-mode): 1.6 cm LEFT ATRIUM             Index        RIGHT ATRIUM           Index LA diam:        2.80 cm 1.37 cm/m   RA Area:     22.60 cm LA Vol (A2C):   46.3 ml 22.70 ml/m  RA Volume:   73.50 ml  36.03 ml/m LA Vol (A4C):   48.8 ml 23.92 ml/m LA Biplane Vol: 49.6 ml 24.32 ml/m  AORTIC VALVE AV Area (Vmax):    1.95 cm AV Area (Vmean):   1.90 cm AV Area (VTI):     1.90 cm AV Vmax:            161.00 cm/s AV Vmean:          118.000 cm/s AV VTI:            0.319 m AV Peak Grad:      10.4 mmHg AV Mean Grad:      6.0 mmHg LVOT Vmax:         75.45 cm/s LVOT Vmean:        53.900 cm/s LVOT VTI:          0.146 m LVOT/AV VTI ratio: 0.46  AORTA Ao Root diam: 3.50 cm Ao Asc diam:  3.90 cm TRICUSPID VALVE TR Peak grad:   21.3 mmHg TR Vmax:        231.00 cm/s  SHUNTS Systemic VTI:  0.15 m Systemic Diam: 2.30 cm Lennie Odor MD Electronically signed by Lennie Odor MD Signature Date/Time: 03/21/2023/12:45:59 PM    Final    DG Chest Port 1 View  Result Date: 03/20/2023 CLINICAL DATA:  Atrial fibrillation EXAM: PORTABLE CHEST 1 VIEW COMPARISON:  10/03/2021 FINDINGS: Shallow inspiration. Heart size and pulmonary vascularity are normal. Hazy linear opacities in the left base may represent atelectasis or infiltration. Right lung is clear. No pleural effusions. No pneumothorax. Mediastinal contours appear intact. IMPRESSION: Shallow inspiration with infiltration or atelectasis in the left base. Electronically Signed   By: Burman Nieves M.D.   On: 03/20/2023 20:07   CT ABDOMEN PELVIS W CONTRAST  Result Date: 03/20/2023 CLINICAL DATA:  Several week history of decreased appetite with nausea and vomiting EXAM: CT ABDOMEN AND PELVIS WITH CONTRAST TECHNIQUE: Multidetector CT imaging of the abdomen and pelvis was performed using the standard protocol following bolus administration of intravenous contrast. RADIATION DOSE REDUCTION: This exam was performed according to the departmental dose-optimization program which includes  automated exposure control, adjustment of the mA and/or kV according to patient size and/or use of iterative reconstruction technique. CONTRAST:  75mL OMNIPAQUE IOHEXOL 350 MG/ML SOLN COMPARISON:  CT abdomen and pelvis dated 03/02/2023 FINDINGS: Lower chest: No focal consolidation or pulmonary nodule in the lung bases. No pleural effusion or pneumothorax demonstrated. Left atrial enlargement.  Coronary artery calcifications. Hepatobiliary: Subcentimeter hypodensity in segment 6/7 (3:26), too small to characterize. No intra or extrahepatic biliary ductal dilation. Normal gallbladder. Pancreas: Persistent but decreased peripancreatic stranding. Diffuse heterogeneous enhancement of the pancreas. No main pancreatic ductal dilation. Spleen: Normal in size without focal abnormality. Adrenals/Urinary Tract: No adrenal nodules. No suspicious renal masses or hydronephrosis. Bilateral nonobstructing renal stones measuring up to 3 mm in the left upper pole. No focal bladder wall thickening. Suboptimal evaluation of the right posterior bladder due to beam hardening artifact from hip arthroplasty. Stomach/Bowel: Normal appearance of the stomach. No evidence of bowel wall thickening, distention, or inflammatory changes. Colonic diverticulosis without acute diverticulitis. Normal appendix. Vascular/Lymphatic: Aortic atherosclerosis. No enlarged abdominal or pelvic lymph nodes. Reproductive: Prostate is unremarkable. Other: No free fluid, fluid collection, or free air. Musculoskeletal: No acute or abnormal lytic or blastic osseous lesions. Right hip arthroplasty. Multilevel degenerative changes of the partially imaged thoracic and lumbar spine. Unchanged grade 1 anterolisthesis at L5-S1. IMPRESSION: 1. Persistent but decreased peripancreatic stranding, consistent with improving acute interstitial pancreatitis. No peripancreatic collection. 2. Bilateral nonobstructing nephrolithiasis. 3. Colonic diverticulosis without acute diverticulitis. 4. Aortic Atherosclerosis (ICD10-I70.0). Coronary artery calcifications. Assessment for potential risk factor modification, dietary therapy or pharmacologic therapy may be warranted, if clinically indicated. Electronically Signed   By: Agustin Cree M.D.   On: 03/20/2023 17:40     Medical Consultants:   None.   Subjective:    EILEEN GAGEN no complaints today.  Objective:     Vitals:   03/21/23 2009 03/22/23 0000 03/22/23 0407 03/22/23 0745  BP: 109/78 97/75 114/77 112/71  Pulse: 71 81 61 76  Resp: 18 18 18 19   Temp: 99.1 F (37.3 C) (!) 97.5 F (36.4 C) 98.7 F (37.1 C) 98.3 F (36.8 C)  TempSrc: Oral Oral Oral Oral  SpO2: 96% 95% 96% 95%  Weight:   87 kg    SpO2: 95 %   Intake/Output Summary (Last 24 hours) at 03/22/2023 1106 Last data filed at 03/22/2023 0800 Gross per 24 hour  Intake 1954.81 ml  Output 790 ml  Net 1164.81 ml   Filed Weights   03/20/23 2200 03/21/23 0500 03/22/23 0407  Weight: 85.5 kg 83.9 kg 87 kg    Exam: General exam: In no acute distress. Respiratory system: Good air movement and clear to auscultation. Cardiovascular system: S1 & S2 heard, RRR. No JVD. Gastrointestinal system: Abdomen is nondistended, soft and nontender.  Extremities: No pedal edema. Skin: No rashes, lesions or ulcers Psychiatry: Judgement and insight appear normal. Mood & affect appropriate.  Data Reviewed:    Labs: Basic Metabolic Panel: Recent Labs  Lab 03/20/23 1129 03/21/23 0436 03/21/23 0729 03/22/23 0524  NA 131*  --  134* 133*  K 4.7  --  4.1 3.6  CL 96*  --  102 106  CO2 22  --  23 23  GLUCOSE 574*  --  234* 227*  BUN 50*  --  34* 21  CREATININE 1.47* 1.45* 1.37* 1.19  CALCIUM 9.9  --  8.8* 8.2*   GFR Estimated Creatinine Clearance: 52.7 mL/min (by C-G formula based on SCr of 1.19 mg/dL). Liver  Function Tests: Recent Labs  Lab 03/20/23 1129  AST 21  ALT 26  ALKPHOS 66  BILITOT 1.0  PROT 7.5  ALBUMIN 3.4*   Recent Labs  Lab 03/20/23 1128  LIPASE 119*   No results for input(s): "AMMONIA" in the last 168 hours. Coagulation profile No results for input(s): "INR", "PROTIME" in the last 168 hours. COVID-19 Labs  No results for input(s): "DDIMER", "FERRITIN", "LDH", "CRP" in the last 72 hours.  Lab Results  Component Value Date   SARSCOV2NAA NEGATIVE 12/10/2019    CBC: Recent Labs  Lab 03/20/23 1128  03/21/23 0436  WBC 6.5 6.2  HGB 14.0 12.2*  HCT 42.6 37.1*  MCV 94.2 93.9  PLT 265 208   Cardiac Enzymes: No results for input(s): "CKTOTAL", "CKMB", "CKMBINDEX", "TROPONINI" in the last 168 hours. BNP (last 3 results) No results for input(s): "PROBNP" in the last 8760 hours. CBG: Recent Labs  Lab 03/21/23 0724 03/21/23 1206 03/21/23 1556 03/21/23 2018 03/22/23 0900  GLUCAP 222* 269* 134* 131* 243*   D-Dimer: No results for input(s): "DDIMER" in the last 72 hours. Hgb A1c: Recent Labs    03/21/23 0436  HGBA1C 11.9*   Lipid Profile: No results for input(s): "CHOL", "HDL", "LDLCALC", "TRIG", "CHOLHDL", "LDLDIRECT" in the last 72 hours. Thyroid function studies: No results for input(s): "TSH", "T4TOTAL", "T3FREE", "THYROIDAB" in the last 72 hours.  Invalid input(s): "FREET3" Anemia work up: No results for input(s): "VITAMINB12", "FOLATE", "FERRITIN", "TIBC", "IRON", "RETICCTPCT" in the last 72 hours. Sepsis Labs: Recent Labs  Lab 03/20/23 1128 03/21/23 0436  WBC 6.5 6.2   Microbiology No results found for this or any previous visit (from the past 240 hour(s)).   Medications:    aspirin  81 mg Oral q AM   enoxaparin (LOVENOX) injection  40 mg Subcutaneous Daily   feeding supplement  1 Container Oral TID BM   fenofibrate  160 mg Oral Daily   icosapent Ethyl  2 g Oral BID   insulin aspart  0-15 Units Subcutaneous TID WC   insulin aspart  0-5 Units Subcutaneous QHS   insulin aspart  3 Units Subcutaneous TID WC   insulin glargine-yfgn  10 Units Subcutaneous BID   lamoTRIgine  50 mg Oral BID   metoprolol tartrate  25 mg Oral BID   pantoprazole  40 mg Oral Daily   topiramate  50 mg Oral BID   Continuous Infusions:      LOS: 2 days   Marinda Elk  [facility]  03/22/2023, 11:06 AM

## 2023-03-23 ENCOUNTER — Other Ambulatory Visit (HOSPITAL_COMMUNITY): Payer: Self-pay

## 2023-03-23 ENCOUNTER — Telehealth (HOSPITAL_COMMUNITY): Payer: Self-pay | Admitting: Pharmacy Technician

## 2023-03-23 DIAGNOSIS — E1100 Type 2 diabetes mellitus with hyperosmolarity without nonketotic hyperglycemic-hyperosmolar coma (NKHHC): Secondary | ICD-10-CM | POA: Diagnosis not present

## 2023-03-23 LAB — BASIC METABOLIC PANEL
Anion gap: 8 (ref 5–15)
BUN: 18 mg/dL (ref 8–23)
CO2: 20 mmol/L — ABNORMAL LOW (ref 22–32)
Calcium: 8.6 mg/dL — ABNORMAL LOW (ref 8.9–10.3)
Chloride: 109 mmol/L (ref 98–111)
Creatinine, Ser: 1.26 mg/dL — ABNORMAL HIGH (ref 0.61–1.24)
GFR, Estimated: 58 mL/min — ABNORMAL LOW (ref >60)
Glucose, Bld: 139 mg/dL — ABNORMAL HIGH (ref 70–99)
Potassium: 3.6 mmol/L (ref 3.5–5.1)
Sodium: 137 mmol/L (ref 135–145)

## 2023-03-23 LAB — GLUCOSE, CAPILLARY
Glucose-Capillary: 149 mg/dL — ABNORMAL HIGH (ref 70–99)
Glucose-Capillary: 265 mg/dL — ABNORMAL HIGH (ref 70–99)

## 2023-03-23 MED ORDER — BLOOD GLUCOSE METER KIT: PACK | 0 refills | Status: AC

## 2023-03-23 MED ORDER — APIXABAN 5 MG PO TABS
5.0000 mg | ORAL_TABLET | Freq: Two times a day (BID) | ORAL | 1 refills | Status: AC
  Filled 2023-03-23: qty 60, 30d supply, fill #0

## 2023-03-23 MED ORDER — LANTUS SOLOSTAR 100 UNIT/ML ~~LOC~~ SOPN: 25.0000 [IU] | PEN_INJECTOR | Freq: Every day | SUBCUTANEOUS | 11 refills | Status: AC

## 2023-03-23 MED ORDER — APIXABAN 5 MG PO TABS: 5.0000 mg | ORAL_TABLET | Freq: Two times a day (BID) | ORAL | 1 refills | Status: DC

## 2023-03-23 MED ORDER — LIVING WELL WITH DIABETES BOOK
Freq: Once | Status: AC
  Filled 2023-03-23: qty 1

## 2023-03-23 MED ORDER — INSULIN STARTER KIT- PEN NEEDLES (ENGLISH)
1.0000 | Freq: Once | Status: DC
  Filled 2023-03-23: qty 1

## 2023-03-23 MED ORDER — FREESTYLE LIBRE 3 PLUS SENSOR MISC: 5 refills | Status: AC

## 2023-03-23 MED ORDER — PEN NEEDLES 3/16" 31G X 5 MM MISC: 1.0000 | Freq: Every evening | 5 refills | Status: AC

## 2023-03-23 NOTE — Discharge Instructions (Addendum)
Contact your primary care provider for glucose less than 70 or greater than 300 Check and record your glucose (sugar) every morning before you eat

## 2023-03-23 NOTE — Progress Notes (Signed)
Occupational Therapy Treatment and Discharge Patient Details Name: Christian Ross MRN: [id number] DOB: [birth year] Today's Date: 03/23/2023   History of present illness Christian Ross is an 82 y.o. male admitted with complaints of progressive weakness that started 2 weeks ago, blood glucose in the ED was greater than 500 also found to be in new onset A-fib rate controlled  past medical history significant for essential hypertension, hyperlipidemia diabetes mellitus type 2 recently diagnosed with pancreatitis and duodenitis.   OT comments  Pt has returned to his baseline independence in self care and ADL transfers. Voices frustration that his blood sugars continue to be inconsistent which has not been the case in a number of years. Encouraged pt to ask MD for clarification on plan for managing blood sugars upon discharge. He reports he has an endocrinology appointment later this month scheduled for follow up. No further OT needs.       If plan is discharge home, recommend the following:      Equipment Recommendations  None recommended by OT    Recommendations for Other Services      Precautions / Restrictions Precautions Precautions: Fall Restrictions Weight Bearing Restrictions: No       Mobility Bed Mobility               General bed mobility comments: seated EOB    Transfers Overall transfer level: Independent                 General transfer comment: pt has been routinely walking in his room and to bathroom without incident     Balance     Sitting balance-Leahy Scale: Normal       Standing balance-Leahy Scale: Good                             ADL either performed or assessed with clinical judgement   ADL Overall ADL's : Independent                                            Extremity/Trunk Assessment Upper Extremity Assessment Upper Extremity Assessment: Overall WFL for tasks assessed   Lower Extremity  Assessment Lower Extremity Assessment: Defer to PT evaluation   Cervical / Trunk Assessment Cervical / Trunk Assessment: Normal    Vision Ability to See in Adequate Light: 0 Adequate Patient Visual Report: No change from baseline     Perception     Praxis      Cognition Arousal: Alert Behavior During Therapy: WFL for tasks assessed/performed Overall Cognitive Status: Within Functional Limits for tasks assessed                                 General Comments: pt is aware of recent memory deficits, having difficulty understanding medical interventions this hospitalization        Exercises      Shoulder Instructions       General Comments      Pertinent Vitals/ Pain       Pain Assessment Pain Assessment: No/denies pain  Home Living Family/patient expects to be discharged to:: Private residence Living Arrangements: Spouse/significant other Available Help at Discharge: Available 24 hours/day Type of Home: House Home Access: Stairs to enter [employer] of Steps: None at the back,  3 at the front   Home Layout: Multi-level;Other (Comment) (split level) Alternate Level Stairs-Number of Steps: pt has stair lifts on all stairs   Bathroom Shower/Tub: Walk-in shower   Bathroom Toilet: Handicapped height     Home Equipment: Cane - single Librarian, academic (2 wheels);Shower seat - built in;Grab bars - tub/shower          Prior Functioning/Environment              Frequency           Progress Toward Goals  OT Goals(current goals can now be found in the care plan section)        Plan      Co-evaluation                 AM-PAC OT "6 Clicks" Daily Activity     Outcome Measure   Help from another person eating meals?: None Help from another person taking care of personal grooming?: None Help from another person toileting, which includes using toliet, bedpan, or urinal?: None Help from another person bathing  (including washing, rinsing, drying)?: None Help from another person to put on and taking off regular upper body clothing?: None Help from another person to put on and taking off regular lower body clothing?: None 6 Click Score: 24    End of Session    OT Visit Diagnosis: Muscle weakness (generalized) (M62.81)   Activity Tolerance Patient tolerated treatment well   Patient Left with call bell/phone within reach;in chair   Nurse Communication          Time: [phone] OT Time Calculation (min): 25 min  Charges: OT General Charges $OT Visit: 1 Visit OT Evaluation $OT Eval Low Complexity: 1 Low OT Treatments $Self Care/Home Management : 8-22 mins Berna Spare, OTR/L Acute Rehabilitation Services Office: [phone]   Evern Bio 03/23/2023, 11:17 AM

## 2023-03-23 NOTE — Inpatient Diabetes Management (Addendum)
Inpatient Diabetes Program Recommendations  AACE/ADA: New Consensus Statement on Inpatient Glycemic Control (2015)  Target Ranges:  Prepandial:   less than 140 mg/dL      Peak postprandial:   less than 180 mg/dL (1-2 hours)      Critically ill patients:  140 - 180 mg/dL   Lab Results  Component Value Date   GLUCAP 265 (H) 03/23/2023   HGBA1C 11.9 (H) 03/21/2023    Latest Reference Range & Units 03/22/23 09:00 03/22/23 11:55 03/22/23 16:35 03/22/23 21:52 03/23/23 07:38 03/23/23 12:36  Glucose-Capillary 70 - 99 mg/dL 865 (H) 784 (H) 696 (H) 305 (H) 149 (H) 265 (H)  (H): Data is abnormally high  Review of Glycemic Control  Diabetes history: DM2 Outpatient Diabetes medications: Glipizide XL 10 mg QAM  Current orders for Inpatient glycemic control: Semglee 15 units bid, Novolog 5 units tid meal coverage, Glucotrol 10 mg daily, Novolog 0-15 units, 0-5 units hs correction  Inpatient Diabetes Program Recommendations:   Discharge needs: -Lantus flexpen [id number] -Pen needles [phone] -Glucose meter [phone] -Libre 3 sensor [phone]  Educated patient on insulin pen use at home. Reviewed contents of insulin flexpen starter kit. Reviewed all steps if insulin pen including attachment of needle, 2-unit air shot, dialing up dose, giving injection, removing needle, disposal of sharps, storage of unused insulin, disposal of insulin etc. Patient able to provide successful return demonstration. Also reviewed troubleshooting with insulin pen. MD to give patient Rxs for insulin pens and insulin pen needles.  Spoke with pt about A1C results 11.9 (295 average blood glucose over the past 2-3 months)and explained what an A1C is, basic pathophysiology of DM Type 2, basic home care, basic diabetes diet nutrition principles, importance of checking CBGs and maintaining good CBG control to prevent long-term and short-term complications. Reviewed signs and symptoms of hyperglycemia and hypoglycemia and how to treat  hypoglycemia at home. Also reviewed blood sugar goals at home.  RNs to provide ongoing basic DM education at bedside with this patient. Have ordered educational booklet and insulin starter kit. MD ordered application of Freestyle CGM at discharge for patient. Education done regarding application and changing CGM sensor (alternate every 14 days on back of arms), 1 hour warm-up, use of glucometer when alert displays, how to scan CGM for glucose reading and information for PCP. Patient has also been given educational packet regarding use CGM sensor including the 1-800 toll free number for any questions, problems or needs related to the [facility] sensors or reader.    Sensor applied by patient to (L) Arm at 1:30 pm.  Explained that glucose readings will not be available until 1 hour after application. Reviewed use of CGM including how to scan, changing Sensor, Vitamin C warning, arrows with glucose readings, and Freestyle app. Patient very appreciative.   Thank you, Christian Ross. Kaide Gage, RN, MSN, CDCES  Diabetes Coordinator Inpatient Glycemic Control Team Team Pager [phone] (8am-5pm) 03/23/2023 2:00 PM

## 2023-03-23 NOTE — Progress Notes (Signed)
Mobility Specialist Progress Note:   03/23/23 1342  Mobility  Activity Ambulated with assistance in hallway  Level of Assistance Contact guard assist, steadying assist  Assistive Device None  Distance Ambulated (ft) 320 ft  Activity Response Tolerated well  Mobility Referral Yes  $Mobility charge 1 Mobility  Mobility Specialist Start Time (ACUTE ONLY) 1342  Mobility Specialist Stop Time (ACUTE ONLY) 1352  Mobility Specialist Time Calculation (min) (ACUTE ONLY) 10 min   Pt received in chair, agreeable to mobility session. Ambulated in hallway with CGA, no AD required. Tolerated well, asx throughout. Returned pt to room, sitting up in chair, all needs met, call bell in reach.   Feliciana Rossetti Mobility Specialist Please contact via Special educational needs teacher or  Rehab office at [phone]

## 2023-03-23 NOTE — Progress Notes (Signed)
Transition of Care [facility]) - Inpatient Brief Assessment   Patient Details  Name: Christian Ross MRN: [id number] Date of Birth: [birth year]  Transition of Care [facility]) CM/SW Contact:    Janae Bridgeman, RN Phone Number: 03/23/2023, 12:33 PM   Clinical Narrative: Cm met with the patient at the bedside to discuss TOC needs.  The patient admitted to the hospital with progressive weakness and blood sugar >500.   Patient is pending diabetes consult at this time.  Patient states that he currently does not check his sugar and will need a new glucometer when he discharges home from the hospital.  Patient was evaluated by PT and Outpatient rehabilitation services were recommended - patient declines and states that he is active at the home.    DME at the home includes RW - doesn't use, Glucometer - broken - needs new one, bathroom scale.  No other TOC needs at this time.  TOC will continue to follow the patient for TOC needs - no other needs at this time.     Transition of Care Asessment: Insurance and Status: (P) Insurance coverage has been reviewed Patient has primary care physician: (P) Yes Home environment has been reviewed: (P) from home with wife Prior level of function:: (P) Independent Prior/Current Home Services: (P) No current home services Social Determinants of Health Reivew: (P) SDOH reviewed interventions complete Readmission risk has been reviewed: (P) Yes Transition of care needs: (P) transition of care needs identified, TOC will continue to follow

## 2023-03-23 NOTE — Plan of Care (Signed)
  Problem: Education: Goal: Knowledge of General Education information will improve Description Including pain rating scale, medication(s)/side effects and non-pharmacologic comfort measures Outcome: Progressing   Problem: Clinical Measurements: Goal: Ability to maintain clinical measurements within normal limits will improve Outcome: Progressing   Problem: Clinical Measurements: Goal: Will remain free from infection Outcome: Progressing   

## 2023-03-23 NOTE — Telephone Encounter (Signed)
Pharmacy Patient Advocate Encounter   Received notification that prior authorization for FreeStyle Libre 3 Sensoris required/requested.   Insurance verification completed.   The patient is insured through [employer] .   Per test claim: PA required; PA submitted to above mentioned insurance via CoverMyMeds Key/confirmation #/EOC [id number] Status is pending

## 2023-03-23 NOTE — Discharge Summary (Signed)
Christian Ross:[id number] DOB: 11/09/1940 DOA: 03/20/2023  PCP: Laurell Josephs, MD (Inactive)  Admit date: 03/20/2023 Discharge date: 03/23/2023  Time spent: 35 minutes  Recommendations for Outpatient Follow-up:  Pcp and cardiology f/u     Discharge Diagnoses:  Principal Problem:   Type 2 diabetes mellitus with hyperosmolar hyperglycemic state (HHS) (HCC) Active Problems:   HTN (hypertension)   Chronic kidney disease, stage 3 unspecified (HCC)   Type 2 diabetes mellitus with other specified complication (HCC)   New onset a-fib (HCC)   Pancreatitis   Discharge Condition: stable  Diet recommendation: carb modified heart healthy  Filed Weights   03/21/23 0500 03/22/23 0407 03/23/23 0435  Weight: 83.9 kg 87 kg 87.6 kg    History of present illness:  From admission h and p Christian Ross is a 82 y.o. male with medical history significant for hypertension, hyperlipidemia, type 2 diabetes, BPH, recently diagnosed pancreatitis duodenitis, who presented to the ED with complaints of progressively worsening generalized weakness for the past 2 weeks.  Associated with poor appetite and poor oral intake.  No reported subjective fevers or chills.   In the ED, the patient was noted to be severely hyperglycemic with serum glucose greater than 500.  Also noted to be in new atrial fibrillation with rate controlled.  The patient received subcu insulin and home meds were restarted.  EDP requested admission for further evaluation and management of present condition.  Admitted by [facility], hospitalist service.  Hospital Course:  Patient presents with abdominal pain. Found to have signs pancreatitis on CT though abdominal pain resolved shortly after arrival and tolerating PO without recurrence of pain. Patient was found to have severe hyperglycemia with A1c in the 11s. He was started on insulin and met with our DM educator and his glucose improved. He will be discharged with a continuous  glucose monitor and lantus 25 units nightly with plans to record daily fasting sugars and follow up closely with PCP for further insulin titration. Patient also with new diagnosis of a-fib, no rvr. Seen by cardiology, updated TTE without significant new findings, advised d/c on apixaban which we are doing, and close f/u with his outpatient cardiologist, which has been scheduled. Evaluated by PT and deemed suitable for discharge, PT advised outpatient PT so consider referral for that.   Procedures: none   Consultations: DM educator  Discharge Exam: Vitals:   03/23/23 0737 03/23/23 1027  BP: 112/80 (!) 134/95  Pulse: 69 73  Resp:    Temp: 98.4 F (36.9 C)   SpO2: 95% 97%    General: NAD Cardiovascular: irreg irreg Respiratory: CTAB Abdomen: soft, non-tender  Discharge Instructions   Discharge Instructions     Diet Carb Modified   Complete by: As directed    Increase activity slowly   Complete by: As directed       Allergies as of 03/23/2023       Reactions   Lisinopril Cough   Other Reaction(s): Not available        Medication List     STOP taking these medications    aspirin 81 MG chewable tablet   glipiZIDE 10 MG 24 hr tablet Commonly known as: GLUCOTROL XL       TAKE these medications    apixaban 5 MG Tabs tablet Commonly known as: ELIQUIS Take 1 tablet (5 mg total) by mouth 2 (two) times daily.   blood glucose meter kit and supplies Dispense based on patient and insurance preference. Use up to  four times daily as directed. (FOR ICD-10 E10.9, E11.9).   fenofibrate 145 MG tablet Commonly known as: TRICOR TAKE 1 TABLET(145 MG) BY MOUTH DAILY What changed: See the new instructions.   FreeStyle Libre 3 Plus Sensor Misc Change sensor every 15 days.   Glucosamine 1500 Complex Caps Take 2 tablets by mouth daily.   icosapent Ethyl 1 g capsule Commonly known as: Vascepa Take 2 capsules (2 g total) by mouth 2 (two) times daily.   lamoTRIgine 25  MG tablet Commonly known as: LAMICTAL Take 2 tablets (50 mg total) by mouth 2 (two) times daily.   Lantus SoloStar 100 UNIT/ML Solostar Pen Generic drug: insulin glargine Inject 25 Units into the skin at bedtime.   metoprolol tartrate 25 MG tablet Commonly known as: LOPRESSOR TAKE 1 TABLET TWICE A DAY   omeprazole 20 MG capsule Commonly known as: PRILOSEC Take 1 capsule (20 mg total) by mouth daily. 30 minutes before breakfast   Pen Needles 3/16" 31G X 5 MM Misc 1 each by Does not apply route at bedtime.   PREVAGEN PO Take 1 tablet by mouth daily.   topiramate 50 MG tablet Commonly known as: Topamax Take 1 tablet (50 mg total) by mouth 2 (two) times daily.   Ubiquinol 200 MG Caps Take 400 mg by mouth in the morning and at bedtime.       Allergies  Allergen Reactions   Lisinopril Cough    Other Reaction(s): Not available    Follow-up Information     Sharlene Dory, PA-C Follow up on 04/23/2023.   Specialty: Cardiology Why: 8:50AM. Post hospital cardiology follow up Contact information: [address] [place] [address] [phone]         Laurell Josephs, MD Follow up.   Specialty: Specialist Contact information: [address] [place] [address] [phone]                  The results of significant diagnostics from this hospitalization (including imaging, microbiology, ancillary and laboratory) are listed below for reference.    Significant Diagnostic Studies: ECHOCARDIOGRAM COMPLETE  Result Date: 03/21/2023    ECHOCARDIOGRAM REPORT   Patient Name:   Christian Ross Date of Exam: 03/21/2023 Medical Rec #:  [id number]          Height:       71.0 in Accession #:    [id number]         Weight:       185.0 lb Date of Birth:  [birth year]          BSA:          2.040 m Patient Age:    82 years           BP:           123/82 mmHg Patient Gender: M                  HR:           74 bpm. Exam Location:  Inpatient Procedure: 2D  Echo, Color Doppler and Cardiac Doppler Indications:    I48.91* Unspecified atrial fibrillation  History:        Patient has prior history of Echocardiogram examinations, most                 recent 11/16/2021. Arrythmias:Atrial Fibrillation; Risk                 Factors:Hypertension, Diabetes, Dyslipidemia and  Sleep Apnea.  Sonographer:    Irving Burton Senior RDCS Referring Phys: [id number] CAROLE N HALL IMPRESSIONS  1. Left ventricular ejection fraction, by estimation, is 45 to 50%. The left ventricle has mildly decreased function. The left ventricle demonstrates global hypokinesis. There is moderate asymmetric left ventricular hypertrophy of the basal-septal segment. Left ventricular diastolic function could not be evaluated.  2. Right ventricular systolic function is mildly reduced. The right ventricular size is mildly enlarged. There is normal pulmonary artery systolic pressure. The estimated right ventricular systolic pressure is 24.3 mmHg.  3. Right atrial size was mildly dilated.  4. The mitral valve is abnormal. Trivial mitral valve regurgitation.  5. The aortic valve is tricuspid. There is moderate calcification of the aortic valve. Aortic valve regurgitation is trivial. Aortic valve sclerosis/calcification is present, without any evidence of aortic stenosis.  6. The inferior vena cava is normal in size with greater than 50% respiratory variability, suggesting right atrial pressure of 3 mmHg. Comparison(s): The left ventricular function is worsened. FINDINGS  Left Ventricle: Left ventricular ejection fraction, by estimation, is 45 to 50%. The left ventricle has mildly decreased function. The left ventricle demonstrates global hypokinesis. The left ventricular internal cavity size was normal in size. There is  moderate asymmetric left ventricular hypertrophy of the basal-septal segment. Abnormal (paradoxical) septal motion, consistent with left bundle branch block. Left ventricular diastolic function could not be  evaluated due to atrial fibrillation. Left ventricular diastolic function could not be evaluated. Right Ventricle: The right ventricular size is mildly enlarged. No increase in right ventricular wall thickness. Right ventricular systolic function is mildly reduced. There is normal pulmonary artery systolic pressure. The tricuspid regurgitant velocity  is 2.31 m/s, and with an assumed right atrial pressure of 3 mmHg, the estimated right ventricular systolic pressure is 24.3 mmHg. Left Atrium: Left atrial size was normal in size. Right Atrium: Right atrial size was mildly dilated. Pericardium: There is no evidence of pericardial effusion. Presence of epicardial fat layer. Mitral Valve: The mitral valve is abnormal. Mild mitral annular calcification. Trivial mitral valve regurgitation. Tricuspid Valve: The tricuspid valve is grossly normal. Tricuspid valve regurgitation is mild . No evidence of tricuspid stenosis. Aortic Valve: The aortic valve is tricuspid. There is moderate calcification of the aortic valve. Aortic valve regurgitation is trivial. Aortic valve sclerosis/calcification is present, without any evidence of aortic stenosis. Aortic valve mean gradient measures 6.0 mmHg. Aortic valve peak gradient measures 10.4 mmHg. Aortic valve area, by VTI measures 1.90 cm. Pulmonic Valve: The pulmonic valve was grossly normal. Pulmonic valve regurgitation is trivial. No evidence of pulmonic stenosis. Aorta: The aortic root and ascending aorta are structurally normal, with no evidence of dilitation. Venous: The inferior vena cava is normal in size with greater than 50% respiratory variability, suggesting right atrial pressure of 3 mmHg. IAS/Shunts: The atrial septum is grossly normal.  LEFT VENTRICLE PLAX 2D LVIDd:         3.60 cm LVIDs:         3.00 cm LV PW:         0.90 cm LV IVS:        1.40 cm LVOT diam:     2.30 cm LV SV:         60 LV SV Index:   30 LVOT Area:     4.15 cm  LV Volumes (MOD) LV vol d, MOD A2C: 76.7  ml LV vol d, MOD A4C: 78.7 ml LV vol s, MOD A2C: 43.3 ml LV vol  s, MOD A4C: 43.9 ml LV SV MOD A2C:     33.4 ml LV SV MOD A4C:     78.7 ml LV SV MOD BP:      34.7 ml RIGHT VENTRICLE RV S prime:     10.40 cm/s TAPSE (M-mode): 1.6 cm LEFT ATRIUM             Index        RIGHT ATRIUM           Index LA diam:        2.80 cm 1.37 cm/m   RA Area:     22.60 cm LA Vol (A2C):   46.3 ml 22.70 ml/m  RA Volume:   73.50 ml  36.03 ml/m LA Vol (A4C):   48.8 ml 23.92 ml/m LA Biplane Vol: 49.6 ml 24.32 ml/m  AORTIC VALVE AV Area (Vmax):    1.95 cm AV Area (Vmean):   1.90 cm AV Area (VTI):     1.90 cm AV Vmax:           161.00 cm/s AV Vmean:          118.000 cm/s AV VTI:            0.319 m AV Peak Grad:      10.4 mmHg AV Mean Grad:      6.0 mmHg LVOT Vmax:         75.45 cm/s LVOT Vmean:        53.900 cm/s LVOT VTI:          0.146 m LVOT/AV VTI ratio: 0.46  AORTA Ao Root diam: 3.50 cm Ao Asc diam:  3.90 cm TRICUSPID VALVE TR Peak grad:   21.3 mmHg TR Vmax:        231.00 cm/s  SHUNTS Systemic VTI:  0.15 m Systemic Diam: 2.30 cm Lennie Odor MD Electronically signed by Lennie Odor MD Signature Date/Time: 03/21/2023/12:45:59 PM    Final    DG Chest Port 1 View  Result Date: 03/20/2023 CLINICAL DATA:  Atrial fibrillation EXAM: PORTABLE CHEST 1 VIEW COMPARISON:  10/03/2021 FINDINGS: Shallow inspiration. Heart size and pulmonary vascularity are normal. Hazy linear opacities in the left base may represent atelectasis or infiltration. Right lung is clear. No pleural effusions. No pneumothorax. Mediastinal contours appear intact. IMPRESSION: Shallow inspiration with infiltration or atelectasis in the left base. Electronically Signed   By: Burman Nieves M.D.   On: 03/20/2023 20:07   CT ABDOMEN PELVIS W CONTRAST  Result Date: 03/20/2023 CLINICAL DATA:  Several week history of decreased appetite with nausea and vomiting EXAM: CT ABDOMEN AND PELVIS WITH CONTRAST TECHNIQUE: Multidetector CT imaging of the abdomen and pelvis  was performed using the standard protocol following bolus administration of intravenous contrast. RADIATION DOSE REDUCTION: This exam was performed according to the departmental dose-optimization program which includes automated exposure control, adjustment of the mA and/or kV according to patient size and/or use of iterative reconstruction technique. CONTRAST:  75mL OMNIPAQUE IOHEXOL 350 MG/ML SOLN COMPARISON:  CT abdomen and pelvis dated 03/02/2023 FINDINGS: Lower chest: No focal consolidation or pulmonary nodule in the lung bases. No pleural effusion or pneumothorax demonstrated. Left atrial enlargement. Coronary artery calcifications. Hepatobiliary: Subcentimeter hypodensity in segment 6/7 (3:26), too small to characterize. No intra or extrahepatic biliary ductal dilation. Normal gallbladder. Pancreas: Persistent but decreased peripancreatic stranding. Diffuse heterogeneous enhancement of the pancreas. No main pancreatic ductal dilation. Spleen: Normal in size without focal abnormality. Adrenals/Urinary Tract: No adrenal nodules. No suspicious renal masses  or hydronephrosis. Bilateral nonobstructing renal stones measuring up to 3 mm in the left upper pole. No focal bladder wall thickening. Suboptimal evaluation of the right posterior bladder due to beam hardening artifact from hip arthroplasty. Stomach/Bowel: Normal appearance of the stomach. No evidence of bowel wall thickening, distention, or inflammatory changes. Colonic diverticulosis without acute diverticulitis. Normal appendix. Vascular/Lymphatic: Aortic atherosclerosis. No enlarged abdominal or pelvic lymph nodes. Reproductive: Prostate is unremarkable. Other: No free fluid, fluid collection, or free air. Musculoskeletal: No acute or abnormal lytic or blastic osseous lesions. Right hip arthroplasty. Multilevel degenerative changes of the partially imaged thoracic and lumbar spine. Unchanged grade 1 anterolisthesis at L5-S1. IMPRESSION: 1. Persistent but  decreased peripancreatic stranding, consistent with improving acute interstitial pancreatitis. No peripancreatic collection. 2. Bilateral nonobstructing nephrolithiasis. 3. Colonic diverticulosis without acute diverticulitis. 4. Aortic Atherosclerosis (ICD10-I70.0). Coronary artery calcifications. Assessment for potential risk factor modification, dietary therapy or pharmacologic therapy may be warranted, if clinically indicated. Electronically Signed   By: Agustin Cree M.D.   On: 03/20/2023 17:40   CT ABDOMEN PELVIS WO CONTRAST  Result Date: 03/02/2023 CLINICAL DATA:  Stage IIIA chronic kidney disease, presents with left lower quadrant pain. EXAM: CT ABDOMEN AND PELVIS WITHOUT CONTRAST TECHNIQUE: Multidetector CT imaging of the abdomen and pelvis was performed following the standard protocol without IV contrast. RADIATION DOSE REDUCTION: This exam was performed according to the departmental dose-optimization program which includes automated exposure control, adjustment of the mA and/or kV according to patient size and/or use of iterative reconstruction technique. COMPARISON:  CT abdomen only, with oral contrast only 10/13/2021, CT abdomen and pelvis with IV contrast 10/30/2016. FINDINGS: Lower chest: The cardiac size is normal. There are patchy calcifications across the aortic valve leaflets and minimally prominent aortic root at the sinuses of Valsalva measuring 4 cm. Coronary arteries are heavily calcified. There is no pericardial effusion. Lung bases show patchy subpleural reticulation and honeycombing consistent with chronic interstitial lung disease. There is a 6 mm chronic pleural-based, stable right lower lobe noncalcified nodule on 3:35, assumed benign due to 6 years stability. Hepatobiliary: Stable chronic 5 mm hypodensity in hepatic segment 6 on 2:27, probably a cyst. The unenhanced liver is otherwise unremarkable. The gallbladder and bile ducts are unremarkable. Pancreas: There is pancreatic and  peripancreatic edema, relatively but not completely sparing the tail, consistent with acute interstitial pancreatitis. There is trace nonlocalizing peripancreatic fluid but no localizing collection, abscess or free air. No masses seen without contrast. Spleen: Unremarkable without contrast. Adrenals/Urinary Tract: There is no adrenal mass. There are bilateral Bosniak 1 renal cysts, largest on the right is 1.1 cm in the inferior pole, largest on the left is 4.2 cm in the inferior pole. No follow-up imaging is recommended. No other contour deforming abnormality is seen in the unenhanced kidneys. There are occasional punctate nonobstructive caliceal stones in the right kidney, on the left there are a few punctate up to 4 mm caliceal stones. No obstructing stones or hydronephrosis are seen although please note that the distal pelvic right ureter is obscured by metallic artifact from a right hip arthroplasty. Stomach/Bowel: There is mild gastric air distention with normal wall thickness. There are thickened folds in the descending and horizontal duodenum, probably reactive from the pancreatitis, alternatively coexisting duodenitis. There is no small bowel obstruction or inflammation. Normal appendix. There is colonic diverticulosis without evidence of acute diverticulitis. Mild fecal stasis in the proximal and distal colon. Vascular/Lymphatic: Aortic atherosclerosis. There are slightly prominent mesenteric root nodes underlying the pancreas measuring  as much as 1.1 cm in short axis, probably reactive from the pancreatitis. There is no further adenopathy, no bulky or encasing nodes. Reproductive: Enlarged prostate, 5 cm transverse. Left testicle is in the inguinal canal. Other: Small umbilical and bilateral inguinal fat hernias. No pelvic ascites. No free hemorrhage or free air. Musculoskeletal: Osteopenia and degenerative skeletal changes. No acute or other significant osseous findings. Bridging osteophytes anterior left  SI joint chronically. Moderate acquired spinal stenosis L4-5. right hip arthroplasty. IMPRESSION: 1. Acute interstitial pancreatitis with trace nonlocalizing peripancreatic fluid but no localizing collection, abscess or free air. 2. Thickened folds in the descending and horizontal duodenum, probably reactive from the pancreatitis, alternatively coexisting duodenitis. 3. Constipation and diverticulosis. 4. Nonobstructive nephrolithiasis. 5. Prostatomegaly. 6. Osteopenia and degenerative change. 7. Aortic and coronary artery atherosclerosis. 8. 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 9. Aortic valve leaflet calcifications. Consider echocardiography if there is concern for valvular dysfunction. The aortic root is 4 cm at the sinuses of Valsalva. 10. Umbilical and bilateral inguinal fat hernias. 11. Left testicle is in the inguinal canal. Aortic Atherosclerosis (ICD10-I70.0). Electronically Signed   By: Almira Bar M.D.   On: 03/02/2023 21:03    Microbiology: No results found for this or any previous visit (from the past 240 hour(s)).   Labs: Basic Metabolic Panel: Recent Labs  Lab 03/20/23 1129 03/21/23 0436 03/21/23 0729 03/22/23 0524 03/23/23 0746  NA 131*  --  134* 133* 137  K 4.7  --  4.1 3.6 3.6  CL 96*  --  102 106 109  CO2 22  --  23 23 20*  GLUCOSE 574*  --  234* 227* 139*  BUN 50*  --  34* 21 18  CREATININE 1.47* 1.45* 1.37* 1.19 1.26*  CALCIUM 9.9  --  8.8* 8.2* 8.6*   Liver Function Tests: Recent Labs  Lab 03/20/23 1129  AST 21  ALT 26  ALKPHOS 66  BILITOT 1.0  PROT 7.5  ALBUMIN 3.4*   Recent Labs  Lab 03/20/23 1128  LIPASE 119*   No results for input(s): "AMMONIA" in the last 168 hours. CBC: Recent Labs  Lab 03/20/23 1128 03/21/23 0436  WBC 6.5 6.2  HGB 14.0 12.2*  HCT 42.6 37.1*  MCV 94.2 93.9  PLT 265 208   Cardiac Enzymes: No results for input(s): "CKTOTAL", "CKMB", "CKMBINDEX", "TROPONINI" in the last 168 hours. BNP: BNP (last 3  results) No results for input(s): "BNP" in the last 8760 hours.  ProBNP (last 3 results) No results for input(s): "PROBNP" in the last 8760 hours.  CBG: Recent Labs  Lab 03/22/23 1155 03/22/23 1635 03/22/23 2152 03/23/23 0738 03/23/23 1236  GLUCAP 168* 199* 305* 149* 265*       Signed:  Silvano Bilis MD.  [facility] 03/23/2023, 2:47 PM

## 2023-03-23 NOTE — Telephone Encounter (Addendum)
Patient Product/process development scientist completed.    The patient is insured through [employer].     Ran test claim for Eliquis 5 mg and the current 30 day co-pay is $43.00.  Ran test claim for Lantus Pen and the current 30 day co-pay is $16.00.  This test claim was processed through [facility]- copay amounts may vary at other pharmacies due to [employer], or as the patient moves through the different stages of their insurance plan.     Roland Earl, CPHT Pharmacy Technician III Certified Patient Advocate [facility] Patient Advocate Team Direct Number: [phone]  Fax: [phone]

## 2023-03-24 NOTE — Care Management Important Message (Signed)
Important Message  Patient Details  Name: REGIE MALOTT MRN: [id number] Date of Birth: [birth year]   Important Message Given:  Yes - Medicare IM  Patient left prior to IM delivery will mail a copy fo the IM to the patient home address.     Eleftherios Dudenhoeffer 03/24/2023, 3:40 PM

## 2023-03-24 NOTE — Telephone Encounter (Signed)
Pharmacy Patient Advocate Encounter  Received notification from [employer] that Prior Authorization for FreeStyle Libre 3 Sensor  has been APPROVED from 03/24/2023 to 03/22/2024   PA #/Case ID/Reference #: [id number]

## 2023-03-26 DIAGNOSIS — I4891 Unspecified atrial fibrillation: Secondary | ICD-10-CM | POA: Diagnosis not present

## 2023-03-26 DIAGNOSIS — K859 Acute pancreatitis without necrosis or infection, unspecified: Secondary | ICD-10-CM | POA: Diagnosis not present

## 2023-03-26 DIAGNOSIS — E1165 Type 2 diabetes mellitus with hyperglycemia: Secondary | ICD-10-CM | POA: Diagnosis not present

## 2023-03-26 DIAGNOSIS — R413 Other amnesia: Secondary | ICD-10-CM | POA: Diagnosis not present

## 2023-03-26 DIAGNOSIS — Z09 Encounter for follow-up examination after completed treatment for conditions other than malignant neoplasm: Secondary | ICD-10-CM | POA: Diagnosis not present

## 2023-04-06 ENCOUNTER — Other Ambulatory Visit: Payer: Self-pay

## 2023-04-06 MED ORDER — TOPIRAMATE 50 MG PO TABS: 50.0000 mg | ORAL_TABLET | Freq: Two times a day (BID) | ORAL | 3 refills | Status: DC

## 2023-04-06 MED ORDER — LAMOTRIGINE 25 MG PO TABS: 50.0000 mg | ORAL_TABLET | Freq: Two times a day (BID) | ORAL | 3 refills | Status: DC

## 2023-04-06 NOTE — Telephone Encounter (Signed)
Christian Ross did you want to take over metoprolol?

## 2023-04-10 ENCOUNTER — Other Ambulatory Visit (INDEPENDENT_AMBULATORY_CARE_PROVIDER_SITE_OTHER): Payer: Medicare Other

## 2023-04-10 ENCOUNTER — Ambulatory Visit (INDEPENDENT_AMBULATORY_CARE_PROVIDER_SITE_OTHER): Payer: Medicare Other

## 2023-04-10 ENCOUNTER — Ambulatory Visit (INDEPENDENT_AMBULATORY_CARE_PROVIDER_SITE_OTHER): Payer: Medicare Other | Admitting: Internal Medicine

## 2023-04-10 ENCOUNTER — Encounter: Payer: Self-pay | Admitting: Internal Medicine

## 2023-04-10 VITALS — BP 128/70 | HR 54 | Ht 71.5 in | Wt 198.6 lb

## 2023-04-10 DIAGNOSIS — Z794 Long term (current) use of insulin: Secondary | ICD-10-CM | POA: Diagnosis not present

## 2023-04-10 DIAGNOSIS — K8500 Idiopathic acute pancreatitis without necrosis or infection: Secondary | ICD-10-CM | POA: Diagnosis not present

## 2023-04-10 DIAGNOSIS — E119 Type 2 diabetes mellitus without complications: Secondary | ICD-10-CM

## 2023-04-10 LAB — COMPREHENSIVE METABOLIC PANEL
ALT: 21 U/L (ref 0–53)
AST: 24 U/L (ref 0–37)
Albumin: 4 g/dL (ref 3.5–5.2)
Alkaline Phosphatase: 57 U/L (ref 39–117)
BUN: 37 mg/dL — ABNORMAL HIGH (ref 6–23)
CO2: 25 meq/L (ref 19–32)
Calcium: 9.6 mg/dL (ref 8.4–10.5)
Chloride: 103 meq/L (ref 96–112)
Creatinine, Ser: 1.61 mg/dL — ABNORMAL HIGH (ref 0.40–1.50)
GFR: 40.11 mL/min — ABNORMAL LOW (ref >60.00)
Glucose, Bld: 190 mg/dL — ABNORMAL HIGH (ref 70–99)
Potassium: 4.4 meq/L (ref 3.5–5.1)
Sodium: 137 meq/L (ref 135–145)
Total Bilirubin: 0.5 mg/dL (ref 0.2–1.2)
Total Protein: 7.6 g/dL (ref 6.0–8.3)

## 2023-04-10 LAB — LIPASE: Lipase: 63 U/L — ABNORMAL HIGH (ref 11.0–59.0)

## 2023-04-10 NOTE — Addendum Note (Signed)
Addended by: [place], [place] Land[place] E on: 04/10/2023 12:48 PM   Modules accepted: Orders

## 2023-04-10 NOTE — Patient Instructions (Signed)
Your provider has requested that you go to the basement level for lab work before leaving today. Press "B" on the elevator. The lab is located at the first door on the left as you exit the elevator.  Due to recent changes in healthcare laws, you may see the results of your imaging and laboratory studies on MyChart before your provider has had a chance to review them.  We understand that in some cases there may be results that are confusing or concerning to you. Not all laboratory results come back in the same time frame and the provider may be waiting for multiple results in order to interpret others.  Please give [place] 48 hours in order for your provider to thoroughly review all the results before contacting the office for clarification of your results.   You have been scheduled for an MRI /MRCP at [facility] on 05/05/2023. Your appointment time is 10:00am. Please arrive to admitting (at main entrance of the hospital) 30 minutes prior to your appointment time for registration purposes. Please make certain not to have anything to eat or drink 6 hours prior to your test. In addition, if you have any metal in your body, have a pacemaker or defibrillator, please be sure to let your ordering physician know. This test typically takes 45 minutes to 1 hour to complete. Should you need to reschedule, please call [phone] to do so.  I appreciate the opportunity to care for you. Stan Head, MD, [facility]

## 2023-04-10 NOTE — Progress Notes (Addendum)
Christian Ross 82 y.o. 1940/11/21 [id number]  Assessment & Plan:   Encounter Diagnoses  Name Primary?   Idiopathic acute pancreatitis without infection or necrosis Yes   Type 2 diabetes mellitus treated with insulin (HCC) recent diagnosis    The patient has pancreatitis of unclear cause (lesser likelihood of a duodenitis) Note his triglycerides were not significantly elevated in October per outside record review though I no longer have that paperwork. Do not think medications likely the cause. Gallstones not ruled out, LFTs were normal. Rare use of alcohol so do not suspect that.  Main thing would be to exclude some small subtle tumor or pancreatic duct stricture that could be malignant.  Plan for MRCP with and without contrast in mid January about 2 months from onset of this.  C-Met today also.  Lipase will be checked if I can added on I neglected to order when he was here.  CC: Laurell Josephs, MD (Inactive)  Subjective:   Chief Complaint: Pancreatitis that you Patty I sent you out any did you to do a lipase on Christian Ross  HPI 82 year old white man recently admitted to the hospital as outlined below and had imaging suggestive of pancreatitis with a mildly elevated lipase.  He presents for follow-up evaluation. Last seen here December 17, 2021, Christian Meeker, PA-C, being treated for reflux.  Had had CT scanning that year without mass lesions to workup epigastric pain.  He was responding to PPI.  Last colonoscopy 2020 for polyp surveillance was negative no recall recommended.   Wt Readings from Last 3 Encounters:  04/10/23 198 lb 9.6 oz (90.1 kg)  03/23/23 193 lb 2 oz (87.6 kg)  10/21/22 206 lb 12.8 oz (93.8 kg)     Discharge summary March 23, 2023 hospitalized 03/20/2023 to 03/23/2023 History of present illness:  From admission h and p Christian Ross is a 82 y.o. male with medical history significant for hypertension, hyperlipidemia, type 2 diabetes, BPH,  recently diagnosed pancreatitis duodenitis, who presented to the ED with complaints of progressively worsening generalized weakness for the past 2 weeks.  Associated with poor appetite and poor oral intake.  No reported subjective fevers or chills.   In the ED, the patient was noted to be severely hyperglycemic with serum glucose greater than 500.  Also noted to be in new atrial fibrillation with rate controlled.  The patient received subcu insulin and home meds were restarted.  EDP requested admission for further evaluation and management of present condition.  Admitted by [facility], hospitalist service.   Hospital Course:  Patient presents with abdominal pain. Found to have signs pancreatitis on CT though abdominal pain resolved shortly after arrival and tolerating PO without recurrence of pain. Patient was found to have severe hyperglycemia with A1c in the 11s. He was started on insulin and met with our DM educator and his glucose improved. He will be discharged with a continuous glucose monitor and lantus 25 units nightly with plans to record daily fasting sugars and follow up closely with PCP for further insulin titration. Patient also with new diagnosis of a-fib, no rvr. Seen by cardiology, updated TTE without significant new findings, advised d/c on apixaban which we are doing, and close f/u with his outpatient cardiologist, which has been scheduled. Evaluated by PT and deemed suitable for discharge, PT advised outpatient PT so consider referral for that.   Lab Results  Component Value Date   LIPASE 119 (H) 03/20/2023   Lab Results  Component Value  Date   NA 137 03/23/2023   CL 109 03/23/2023   K 3.6 03/23/2023   CO2 20 (L) 03/23/2023   BUN 18 03/23/2023   CREATININE 1.26 (H) 03/23/2023   GFRNONAA 58 (L) 03/23/2023   CALCIUM 8.6 (L) 03/23/2023   ALBUMIN 3.4 (L) 03/20/2023   GLUCOSE 139 (H) 03/23/2023    Lab Results  Component Value Date   ALT 26 03/20/2023   AST 21 03/20/2023   ALKPHOS  66 03/20/2023   BILITOT 1.0 03/20/2023    CT abdomen pelvis with contrast 03/02/2023 IMPRESSION: 1. Acute interstitial pancreatitis with trace nonlocalizing peripancreatic fluid but no localizing collection, abscess or free air. 2. Thickened folds in the descending and horizontal duodenum, probably reactive from the pancreatitis, alternatively coexisting duodenitis. 3. Constipation and diverticulosis. 4. Nonobstructive nephrolithiasis. 5. Prostatomegaly. 6. Osteopenia and degenerative change. 7. Aortic and coronary artery atherosclerosis. 8. 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 9. Aortic valve leaflet calcifications. Consider echocardiography if there is concern for valvular dysfunction. The aortic root is 4 cm at the sinuses of Valsalva. 10. Umbilical and bilateral inguinal fat hernias. 11. Left testicle is in the inguinal canal. CT abdomen and pelvis with contrast 03/20/2023 IMPRESSION: 1. Persistent but decreased peripancreatic stranding, consistent with improving acute interstitial pancreatitis. No peripancreatic collection. 2. Bilateral nonobstructing nephrolithiasis. 3. Colonic diverticulosis without acute diverticulitis. 4. Aortic Atherosclerosis (ICD10-I70.0). Coronary artery calcifications. Assessment for potential risk factor modification, dietary therapy or pharmacologic therapy may be warranted, if clinically indicated.    Allergies  Allergen Reactions   Lisinopril Cough    Other Reaction(s): Not available   Current Meds  Medication Sig   apixaban (ELIQUIS) 5 MG TABS tablet Take 1 tablet (5 mg total) by mouth 2 (two) times daily.   Apoaequorin (PREVAGEN PO) Take 1 tablet by mouth daily.   blood glucose meter kit and supplies Dispense based on patient and insurance preference. Use up to four times daily as directed. (FOR ICD-10 E10.9, E11.9).   Continuous Glucose Receiver (DEXCOM G7 RECEIVER) DEVI    Continuous Glucose Sensor (FREESTYLE LIBRE  3 PLUS SENSOR) MISC Change sensor every 15 days.   fenofibrate (TRICOR) 145 MG tablet TAKE 1 TABLET(145 MG) BY MOUTH DAILY (Patient taking differently: Take 145 mg by mouth daily.)   Glucosamine-Chondroit-Vit C-Mn (GLUCOSAMINE 1500 COMPLEX) CAPS Take 2 tablets by mouth daily.   icosapent Ethyl (VASCEPA) 1 g capsule Take 2 capsules (2 g total) by mouth 2 (two) times daily.   insulin glargine (LANTUS SOLOSTAR) 100 UNIT/ML Solostar Pen Inject 25 Units into the skin at bedtime.   Insulin Pen Needle (PEN NEEDLES 3/16") 31G X 5 MM MISC 1 each by Does not apply route at bedtime.   lamoTRIgine (LAMICTAL) 25 MG tablet Take 2 tablets (50 mg total) by mouth 2 (two) times daily.   metoprolol tartrate (LOPRESSOR) 25 MG tablet TAKE 1 TABLET TWICE A DAY   omeprazole (PRILOSEC) 20 MG capsule Take 1 capsule (20 mg total) by mouth daily. 30 minutes before breakfast   topiramate (TOPAMAX) 50 MG tablet Take 1 tablet (50 mg total) by mouth 2 (two) times daily.   Ubiquinol 200 MG CAPS Take 400 mg by mouth in the morning and at bedtime.    Past Medical History:  Diagnosis Date   Allergy    Aortic atherosclerosis (HCC)    Arthritis    Bilateral nephrolithiasis    nonobstructing   Cancer (HCC)    skin pre cancer nose   Cough 08/2014  new x24mo, denies fever and shortness of breath   Diabetes mellitus without complication (HCC)    type 2    Diverticulitis    Erectile dysfunction    Hemorrhoids    History of skin cancer    basal cell removed left arm 09/07/14   Hx of colonic polyps    Hyperlipidemia    Hypertension    orthostatic   Hypertriglyceridemia    Kidney stones    Rotator cuff tear    left   Sleep apnea    cpap setting at 4    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 Anne Hahn   Vitamin D deficiency    Past Surgical History:  Procedure Laterality Date   COLONOSCOPY W/ BIOPSIES AND POLYPECTOMY  multiple, 2005, 08, 14   KNEE SURGERY  1960   right / for osgood- schlatter   NASAL SINUS SURGERY      RECTAL BIOPSY     SHOULDER ARTHROSCOPY WITH ROTATOR CUFF REPAIR AND SUBACROMIAL DECOMPRESSION Right 12/14/2019   Procedure: Right shoulder mini open rotator cuff repair, subacromial decompression,;  Surgeon: Jene Every, MD;  Location: [facility];  Service: Orthopedics;  Laterality: Right;  2 hrs Choice with Block   SHOULDER OPEN ROTATOR CUFF REPAIR Left 09/14/2014   Procedure: LEFT SHOULDER MINI OPEN ROTATOR CUFF REPAIR AND SUBACROMIAL DECOMPRESSION ;  Surgeon: Jene Every, MD;  Location: [facility];  Service: Orthopedics;  Laterality: Left;   TOTAL HIP ARTHROPLASTY Right 08/22/2021   Procedure: TOTAL HIP ARTHROPLASTY ANTERIOR APPROACH;  Surgeon: Samson Frederic, MD;  Location: [facility];  Service: Orthopedics;  Laterality: Right;  150   Social History   Social History Narrative   Lives at home w/ wife   Patient drinks a couple of sodas a week.   Patient is right handed.    family history is not on file.   Review of Systems As per HPI  Objective:   Physical Exam @BP  128/70   Pulse (!) 54   Ht 5' 11.5" (1.816 m)   Wt 198 lb 9.6 oz (90.1 kg)   SpO2 97%   BMI 27.31 kg/m @  General:  NAD Eyes:   anicteric Lungs:  clear Heart::  S1S2 no rubs, murmurs or gallops NL rhythm Abdomen:  soft and nontender, BS+, no HSM, mass Ext:   no edema, cyanosis or clubbing    Data Reviewed:  See HPI

## 2023-04-13 ENCOUNTER — Other Ambulatory Visit: Payer: Self-pay

## 2023-04-20 ENCOUNTER — Other Ambulatory Visit: Payer: Self-pay

## 2023-04-21 NOTE — Progress Notes (Signed)
 Cardiology Office Note:  .   Date:  04/23/2023  ID:  Christian Ross, DOB 10-18-1940, MRN [id number] PCP: Kip Beverley SQUIBB, MD (Inactive)  [facility] Providers Cardiologist:  Aleene Passe, MD {  History of Present Illness: .   Christian Ross is a 82 y.o. male with a past medical history of hyperlipidemia and hypertension who has been seen by Dr. Passe since 2014.  He is here for follow-up visit.  History includes BPH issues taking Flomax , trying to improve exercise over time.  Glucoses have been slightly elevated in 2017 and was being watched closely.  Tolerating Crestor  for hyperlipidemia.  He also has a history of cluster headaches.  PCP was following his diabetes mellitus and lipids very closely.  In 2020 a discussion about Vascepa  with LDL 125 triglycerides 281.  Although eating lots of carbohydrates and fats which was discussed with the patient.  Eventually, started on Vascepa  February 2021 but recent triglycerides still markedly elevated.  Not exercising much at this point.  Started on Repatha  April 2022.  Had a new right hip replaced in 2023 and systemic issues causing her current abdominal pain.  No chest pain.  Became somewhat deconditioned due to this.  Had some chest pressure and went to the ER October 03, 2021 workup was negative.  Troponins were negative.  Discovered to have a soft systolic murmur at this time and echocardiogram was ordered.  Last seen October 21, 2022 and was doing well at that time.  Today, he presents with a history of atrial fibrillation, diabetes, and high cholesterol, was recently hospitalized due to high blood glucose levels. The patient reported experiencing blurry vision due to the high glucose levels and was hospitalized for four days. During the hospital stay, the patient was started on a Freestyle glucose monitor for diabetes management. The patient also has a history of atrial fibrillation and is currently on Eliquis . The patient reports no symptoms  from the atrial fibrillation and is rate controlled. The patient also has a history of high cholesterol and has been on Repatha  in the past but discontinued due to lack of follow-up. The patient is reluctant to start statin therapy due to potential side effects. Repatha , nexlizet, leqvio discussed with the patient and he would like to do his own research and get back to us . He was encouraged to be on medication due to his LDL.  Reports no shortness of breath nor dyspnea on exertion. Reports no chest pain, pressure, or tightness. No edema, orthopnea, PND. Reports no palpitations.   Discussed the use of AI scribe software for clinical note transcription with the patient, who gave verbal consent to proceed.   ROS: Pertinent ROS in HPI  Studies Reviewed: .       Echo 03/21/23 IMPRESSIONS     1. Left ventricular ejection fraction, by estimation, is 45 to 50%. The  left ventricle has mildly decreased function. The left ventricle  demonstrates global hypokinesis. There is moderate asymmetric left  ventricular hypertrophy of the basal-septal  segment. Left ventricular diastolic function could not be evaluated.   2. Right ventricular systolic function is mildly reduced. The right  ventricular size is mildly enlarged. There is normal pulmonary artery  systolic pressure. The estimated right ventricular systolic pressure is  24.3 mmHg.   3. Right atrial size was mildly dilated.   4. The mitral valve is abnormal. Trivial mitral valve regurgitation.   5. The aortic valve is tricuspid. There is moderate calcification of the  aortic valve. Aortic valve regurgitation is trivial. Aortic valve  sclerosis/calcification is present, without any evidence of aortic  stenosis.   6. The inferior vena cava is normal in size with greater than 50%  respiratory variability, suggesting right atrial pressure of 3 mmHg.   Comparison(s): The left ventricular function is worsened.   FINDINGS   Left Ventricle: Left  ventricular ejection fraction, by estimation, is 45  to 50%. The left ventricle has mildly decreased function. The left  ventricle demonstrates global hypokinesis. The left ventricular internal  cavity size was normal in size. There is   moderate asymmetric left ventricular hypertrophy of the basal-septal  segment. Abnormal (paradoxical) septal motion, consistent with left bundle  branch block. Left ventricular diastolic function could not be evaluated  due to atrial fibrillation. Left  ventricular diastolic function could not be evaluated.   Right Ventricle: The right ventricular size is mildly enlarged. No  increase in right ventricular wall thickness. Right ventricular systolic  function is mildly reduced. There is normal pulmonary artery systolic  pressure. The tricuspid regurgitant velocity   is 2.31 m/s, and with an assumed right atrial pressure of 3 mmHg, the  estimated right ventricular systolic pressure is 24.3 mmHg.   Left Atrium: Left atrial size was normal in size.   Right Atrium: Right atrial size was mildly dilated.   Pericardium: There is no evidence of pericardial effusion. Presence of  epicardial fat layer.   Mitral Valve: The mitral valve is abnormal. Mild mitral annular  calcification. Trivial mitral valve regurgitation.   Tricuspid Valve: The tricuspid valve is grossly normal. Tricuspid valve  regurgitation is mild . No evidence of tricuspid stenosis.   Aortic Valve: The aortic valve is tricuspid. There is moderate  calcification of the aortic valve. Aortic valve regurgitation is trivial.  Aortic valve sclerosis/calcification is present, without any evidence of  aortic stenosis. Aortic valve mean gradient  measures 6.0 mmHg. Aortic valve peak gradient measures 10.4 mmHg. Aortic  valve area, by VTI measures 1.90 cm.   Pulmonic Valve: The pulmonic valve was grossly normal. Pulmonic valve  regurgitation is trivial. No evidence of pulmonic stenosis.   Aorta:  The aortic root and ascending aorta are structurally normal, with  no evidence of dilitation.   Venous: The inferior vena cava is normal in size with greater than 50%  respiratory variability, suggesting right atrial pressure of 3 mmHg.   IAS/Shunts: The atrial septum is grossly normal.   Risk Assessment/Calculations:    CHA2DS2-VASc Score = 4   This indicates a 4.8% annual risk of stroke. The patient's score is based upon: CHF History: 0 HTN History: 1 Diabetes History: 1 Stroke History: 0 Vascular Disease History: 0 Age Score: 2 Gender Score: 0  Physical Exam:   VS:  BP 136/88   Pulse 75   Ht 5' 11.5 (1.816 m)   Wt 190 lb 12.8 oz (86.5 kg)   SpO2 98%   BMI 26.24 kg/m    Wt Readings from Last 3 Encounters:  04/23/23 190 lb 12.8 oz (86.5 kg)  04/10/23 198 lb 9.6 oz (90.1 kg)  03/23/23 193 lb 2 oz (87.6 kg)    GEN: Well nourished, well developed in no acute distress NECK: No JVD; No carotid bruits CARDIAC: IRIR, no murmurs, rubs, gallops RESPIRATORY:  Clear to auscultation without rales, wheezing or rhonchi  ABDOMEN: Soft, non-tender, non-distended EXTREMITIES:  mild LE bilateral edema; No deformity   ASSESSMENT AND PLAN: .    Atrial Fibrillation (new onset,  rate controlled) Asymptomatic, rate controlled. On Eliquis  for stroke prevention. Discussed potential future options for discontinuing anticoagulation including cardioversion and Watchman procedure, but patient prefers to continue Eliquis  at this time. -Continue Eliquis  5mg  BID. -Continue Metoprolol  25mg  BID for rate control.  Hyperlipidemia LDL elevated at 152, despite no history of coronary disease. Patient has previously tried statins but prefers to avoid due to potential side effects. Discussed non-statin options including Nexlizet, Repatha , and Leqvio. -Patient to research non-statin options and discuss further with primary cardiologist.  Diabetes Mellitus Recent hospitalization for hyperglycemia with  symptoms of blurred vision. Currently on insulin  and monitoring blood glucose at home with Freestyle and G7 monitors. -Continue current management and monitor blood glucose levels at home. -adequate control will also help his TGs  Hypertension Blood pressure slightly elevated with diastolic reading of 88. Patient has home blood pressure monitor. -Monitor blood pressure at home and contact office if readings consistently in high 80s or low 90s.  General Health Maintenance / Followup Plans -Plan for MRI in 1.5 weeks. Patient to remove glucose monitor prior to procedure. -Follow up in 6 months  due to current cardiologist's upcoming retirement. -Consider cholesterol management options and discuss at next visit.     Dispo: He can follow-up in 6 months  Signed, Orren LOISE Fabry, PA-C

## 2023-04-23 ENCOUNTER — Ambulatory Visit: Payer: Medicare Other | Attending: Physician Assistant | Admitting: Physician Assistant

## 2023-04-23 VITALS — BP 136/88 | HR 75 | Ht 71.5 in | Wt 190.8 lb

## 2023-04-23 DIAGNOSIS — I10 Essential (primary) hypertension: Secondary | ICD-10-CM

## 2023-04-23 DIAGNOSIS — E782 Mixed hyperlipidemia: Secondary | ICD-10-CM

## 2023-04-23 DIAGNOSIS — E781 Pure hyperglyceridemia: Secondary | ICD-10-CM | POA: Diagnosis not present

## 2023-04-23 DIAGNOSIS — R011 Cardiac murmur, unspecified: Secondary | ICD-10-CM

## 2023-04-23 NOTE — Patient Instructions (Signed)
Medication Instructions:  Your physician recommends that you continue on your current medications as directed. Please refer to the Current Medication list given to you today. *If you need a refill on your cardiac medications before your next appointment, please call your pharmacy*   Lab Work: None ordered If you have labs (blood work) drawn today and your tests are completely normal, you will receive your results only by: MyChart Message (if you have MyChart) OR A paper copy in the mail If you have any lab test that is abnormal or we need to change your treatment, we will call you to review the results.   Testing/Procedures: None ordered   Follow-Up: At [facility], you and your health needs are our priority.  As part of our continuing mission to provide you with exceptional heart care, we have created designated Provider Care Teams.  These Care Teams include your primary Cardiologist (physician) and Advanced Practice Providers (APPs -  Physician Assistants and Nurse Practitioners) who all work together to provide you with the care you need, when you need it.  We recommend signing up for the patient portal called "MyChart".  Sign up information is provided on this After Visit Summary.  MyChart is used to connect with patients for Virtual Visits (Telemedicine).  Patients are able to view lab/test results, encounter notes, upcoming appointments, etc.  Non-urgent messages can be sent to your provider as well.   To learn more about what you can do with MyChart, go to [email or web address].    Your next appointment:   6 month(s)  Provider:   Philip Nahser, MD    Other Instructions   

## 2023-04-24 ENCOUNTER — Encounter: Payer: Self-pay | Admitting: Physician Assistant

## 2023-04-28 DIAGNOSIS — L814 Other melanin hyperpigmentation: Secondary | ICD-10-CM | POA: Diagnosis not present

## 2023-04-28 DIAGNOSIS — L821 Other seborrheic keratosis: Secondary | ICD-10-CM | POA: Diagnosis not present

## 2023-04-28 DIAGNOSIS — Z85828 Personal history of other malignant neoplasm of skin: Secondary | ICD-10-CM | POA: Diagnosis not present

## 2023-04-28 DIAGNOSIS — L570 Actinic keratosis: Secondary | ICD-10-CM | POA: Diagnosis not present

## 2023-04-28 DIAGNOSIS — D225 Melanocytic nevi of trunk: Secondary | ICD-10-CM | POA: Diagnosis not present

## 2023-04-28 DIAGNOSIS — L578 Other skin changes due to chronic exposure to nonionizing radiation: Secondary | ICD-10-CM | POA: Diagnosis not present

## 2023-04-28 DIAGNOSIS — Z86018 Personal history of other benign neoplasm: Secondary | ICD-10-CM | POA: Diagnosis not present

## 2023-04-30 DIAGNOSIS — E785 Hyperlipidemia, unspecified: Secondary | ICD-10-CM | POA: Diagnosis not present

## 2023-04-30 DIAGNOSIS — I10 Essential (primary) hypertension: Secondary | ICD-10-CM | POA: Diagnosis not present

## 2023-04-30 DIAGNOSIS — E1165 Type 2 diabetes mellitus with hyperglycemia: Secondary | ICD-10-CM | POA: Diagnosis not present

## 2023-04-30 DIAGNOSIS — N183 Chronic kidney disease, stage 3 unspecified: Secondary | ICD-10-CM | POA: Diagnosis not present

## 2023-04-30 DIAGNOSIS — Z8719 Personal history of other diseases of the digestive system: Secondary | ICD-10-CM | POA: Diagnosis not present

## 2023-05-05 ENCOUNTER — Ambulatory Visit (HOSPITAL_COMMUNITY)
Admission: RE | Admit: 2023-05-05 | Discharge: 2023-05-05 | Disposition: A | Discharge status: Home / Self Care | Payer: Medicare Other | Source: Ambulatory Visit | Attending: Internal Medicine | Admitting: Internal Medicine

## 2023-05-05 ENCOUNTER — Other Ambulatory Visit: Payer: Self-pay | Admitting: Internal Medicine

## 2023-05-05 DIAGNOSIS — K8500 Idiopathic acute pancreatitis without necrosis or infection: Secondary | ICD-10-CM | POA: Diagnosis not present

## 2023-05-05 DIAGNOSIS — R109 Unspecified abdominal pain: Secondary | ICD-10-CM | POA: Diagnosis not present

## 2023-05-05 DIAGNOSIS — K8591 Acute pancreatitis with uninfected necrosis, unspecified: Secondary | ICD-10-CM | POA: Diagnosis not present

## 2023-05-05 MED ORDER — GADOBUTROL 1 MMOL/ML IV SOLN
9.0000 mL | Freq: Once | INTRAVENOUS | Status: AC | PRN
  Administered 2023-05-05: 9 mL via INTRAVENOUS

## 2023-05-08 ENCOUNTER — Telehealth: Payer: Self-pay | Admitting: Internal Medicine

## 2023-05-08 NOTE — Telephone Encounter (Signed)
Spoke with pt wife Elby Showers and notified her that the results were not yet final.  Elby Showers verbalized understanding with all questions answered.

## 2023-05-08 NOTE — Telephone Encounter (Signed)
Patient called and stated that he had a MRI done and was inquiring on the result of that. Please advise.

## 2023-05-12 ENCOUNTER — Telehealth: Payer: Self-pay | Admitting: Internal Medicine

## 2023-05-12 DIAGNOSIS — K8500 Idiopathic acute pancreatitis without necrosis or infection: Secondary | ICD-10-CM

## 2023-05-12 NOTE — Telephone Encounter (Signed)
Reviewed MRI - pancreas still edematous and looks like pancreatitis still  Hew feels fine  W/U as below  Orders Placed This Encounter  Procedures   IgG 4   Lipase   Amylase

## 2023-05-19 DIAGNOSIS — J310 Chronic rhinitis: Secondary | ICD-10-CM | POA: Diagnosis not present

## 2023-05-19 DIAGNOSIS — H6122 Impacted cerumen, left ear: Secondary | ICD-10-CM | POA: Diagnosis not present

## 2023-06-12 ENCOUNTER — Other Ambulatory Visit: Payer: Self-pay | Admitting: Cardiovascular Disease

## 2023-06-16 ENCOUNTER — Telehealth: Payer: Self-pay | Admitting: Internal Medicine

## 2023-06-16 DIAGNOSIS — K8500 Idiopathic acute pancreatitis without necrosis or infection: Secondary | ICD-10-CM

## 2023-06-16 NOTE — Telephone Encounter (Signed)
 Inbound call from patient, requesting to follow up on plan of care. He states he would like an update on his plan of care, he is still having abdominal pains and would like to know further steps. Please advise.

## 2023-06-16 NOTE — Telephone Encounter (Signed)
 Pt stated that he wanted to check on the progress of the investigation of his issues. Pt stated that Dr. Leone Payor was going to check with another specialist and get back with him. Pt was questioned about current symptoms and pt stated that he had no GI symptoms besides Gas. Pt was notified that Dr. Leone Payor is out of the office this week and will return next Monday. Please review and advise.

## 2023-06-21 NOTE — Telephone Encounter (Signed)
 I had called him on January 21 and had wanted him to do labs.  I have reordered those as I think they expired.  I apologize for any possible miscommunication regarding this.  Once I see those results I wanted to review things with a colleague potentially.  Please ask him to come for the labs and I will contact him after I see all the results.  One of the test can take several days or more to return.  Orders Placed This Encounter  Procedures   IgG 4   Amylase   Lipase

## 2023-06-22 ENCOUNTER — Other Ambulatory Visit

## 2023-06-22 ENCOUNTER — Other Ambulatory Visit: Payer: Self-pay

## 2023-06-22 DIAGNOSIS — K8500 Idiopathic acute pancreatitis without necrosis or infection: Secondary | ICD-10-CM

## 2023-06-22 LAB — LIPASE: Lipase: 44 U/L (ref 11.0–59.0)

## 2023-06-22 LAB — AMYLASE: Amylase: 76 U/L (ref 27–131)

## 2023-06-22 NOTE — Telephone Encounter (Signed)
 Pt made aware of Dr. Leone Payor recommendations. Pt given location to lab. Pt verbalized understanding with all questions answered.

## 2023-06-24 ENCOUNTER — Other Ambulatory Visit: Payer: Self-pay

## 2023-06-24 ENCOUNTER — Telehealth: Payer: Self-pay | Admitting: Internal Medicine

## 2023-06-24 DIAGNOSIS — K859 Acute pancreatitis without necrosis or infection, unspecified: Secondary | ICD-10-CM

## 2023-06-24 DIAGNOSIS — M25511 Pain in right shoulder: Secondary | ICD-10-CM | POA: Diagnosis not present

## 2023-06-24 LAB — IGG 4: IgG, Subclass 4: 81 mg/dL (ref 2–96)

## 2023-06-24 NOTE — Telephone Encounter (Signed)
 Patient returned your call, please advise.

## 2023-06-24 NOTE — Telephone Encounter (Signed)
Spoke to pt. Documented in result notes.  Pt verbalized understanding with all questions answered.

## 2023-07-07 DIAGNOSIS — H5201 Hypermetropia, right eye: Secondary | ICD-10-CM | POA: Diagnosis not present

## 2023-07-07 DIAGNOSIS — H524 Presbyopia: Secondary | ICD-10-CM | POA: Diagnosis not present

## 2023-07-07 DIAGNOSIS — H35033 Hypertensive retinopathy, bilateral: Secondary | ICD-10-CM | POA: Diagnosis not present

## 2023-07-07 DIAGNOSIS — H52221 Regular astigmatism, right eye: Secondary | ICD-10-CM | POA: Diagnosis not present

## 2023-07-07 DIAGNOSIS — H43812 Vitreous degeneration, left eye: Secondary | ICD-10-CM | POA: Diagnosis not present

## 2023-07-07 DIAGNOSIS — E1165 Type 2 diabetes mellitus with hyperglycemia: Secondary | ICD-10-CM | POA: Diagnosis not present

## 2023-07-07 DIAGNOSIS — H25813 Combined forms of age-related cataract, bilateral: Secondary | ICD-10-CM | POA: Diagnosis not present

## 2023-07-29 DIAGNOSIS — E785 Hyperlipidemia, unspecified: Secondary | ICD-10-CM | POA: Diagnosis not present

## 2023-07-29 DIAGNOSIS — Z0000 Encounter for general adult medical examination without abnormal findings: Secondary | ICD-10-CM | POA: Diagnosis not present

## 2023-07-29 DIAGNOSIS — E1169 Type 2 diabetes mellitus with other specified complication: Secondary | ICD-10-CM | POA: Diagnosis not present

## 2023-07-29 DIAGNOSIS — N183 Chronic kidney disease, stage 3 unspecified: Secondary | ICD-10-CM | POA: Diagnosis not present

## 2023-07-29 LAB — LAB REPORT - SCANNED: EGFR: 47

## 2023-08-03 DIAGNOSIS — G44059 Short lasting unilateral neuralgiform headache with conjunctival injection and tearing (SUNCT), not intractable: Secondary | ICD-10-CM | POA: Diagnosis not present

## 2023-08-03 DIAGNOSIS — I10 Essential (primary) hypertension: Secondary | ICD-10-CM | POA: Diagnosis not present

## 2023-08-03 DIAGNOSIS — E1169 Type 2 diabetes mellitus with other specified complication: Secondary | ICD-10-CM | POA: Diagnosis not present

## 2023-08-03 DIAGNOSIS — I4891 Unspecified atrial fibrillation: Secondary | ICD-10-CM | POA: Diagnosis not present

## 2023-08-03 DIAGNOSIS — N183 Chronic kidney disease, stage 3 unspecified: Secondary | ICD-10-CM | POA: Diagnosis not present

## 2023-08-03 DIAGNOSIS — Z0000 Encounter for general adult medical examination without abnormal findings: Secondary | ICD-10-CM | POA: Diagnosis not present

## 2023-08-03 DIAGNOSIS — G4733 Obstructive sleep apnea (adult) (pediatric): Secondary | ICD-10-CM | POA: Diagnosis not present

## 2023-08-03 DIAGNOSIS — E785 Hyperlipidemia, unspecified: Secondary | ICD-10-CM | POA: Diagnosis not present

## 2023-08-21 ENCOUNTER — Ambulatory Visit (HOSPITAL_COMMUNITY)
Admission: RE | Admit: 2023-08-21 | Discharge: 2023-08-21 | Disposition: A | Discharge status: Home / Self Care | Source: Ambulatory Visit | Attending: Internal Medicine | Admitting: Internal Medicine

## 2023-08-21 ENCOUNTER — Other Ambulatory Visit: Payer: Self-pay | Admitting: Internal Medicine

## 2023-08-21 DIAGNOSIS — K859 Acute pancreatitis without necrosis or infection, unspecified: Secondary | ICD-10-CM

## 2023-08-21 DIAGNOSIS — N281 Cyst of kidney, acquired: Secondary | ICD-10-CM | POA: Diagnosis not present

## 2023-08-21 DIAGNOSIS — I700 Atherosclerosis of aorta: Secondary | ICD-10-CM | POA: Diagnosis not present

## 2023-08-21 MED ORDER — GADOBUTROL 1 MMOL/ML IV SOLN
8.0000 mL | Freq: Once | INTRAVENOUS | Status: AC | PRN
  Administered 2023-08-21: 8 mL via INTRAVENOUS

## 2023-08-26 ENCOUNTER — Encounter: Payer: Self-pay | Admitting: Internal Medicine

## 2023-08-27 ENCOUNTER — Encounter: Payer: Self-pay | Admitting: Neurology

## 2023-08-27 ENCOUNTER — Ambulatory Visit: Payer: Medicare Other | Admitting: Neurology

## 2023-08-27 VITALS — BP 151/75 | HR 57 | Resp 14 | Ht 71.0 in | Wt 201.5 lb

## 2023-08-27 DIAGNOSIS — G44059 Short lasting unilateral neuralgiform headache with conjunctival injection and tearing (SUNCT), not intractable: Secondary | ICD-10-CM

## 2023-08-27 MED ORDER — TOPIRAMATE 50 MG PO TABS: 50.0000 mg | ORAL_TABLET | Freq: Two times a day (BID) | ORAL | 3 refills | Status: AC

## 2023-08-27 MED ORDER — LAMOTRIGINE 25 MG PO TABS: 75.0000 mg | ORAL_TABLET | Freq: Two times a day (BID) | ORAL | 3 refills | Status: DC

## 2023-08-27 NOTE — Patient Instructions (Signed)
 Great to see you today.  Will continue current medications.  Please call if symptoms increase.  Keep follow-up with primary care.  Follow-up in 1 year.  Thanks!!

## 2023-08-27 NOTE — Progress Notes (Signed)
 Patient: Christian Ross Date of Birth: 1940-07-23  Reason for Visit: Follow up for left SUNCT headache  History from: Patient Primary Neurologist: Dr. Chima/Dr. Tresia Fruit   ASSESSMENT AND PLAN 83 y.o. year old male   Left sided SUNCT headache  -Pain remains under excellent control -Continue current dose of Lamictal  75 mg twice a day, Topamax  50 mg twice a day.  We have tried to reduce Lamictal  last year to 50 mg twice a day, he reported return of pain. -PCP follows labs -Call if headache pain increases, otherwise follow-up in 1 year or sooner if needed  Meds ordered this encounter  Medications   topiramate  (TOPAMAX ) 50 MG tablet    Sig: Take 1 tablet (50 mg total) by mouth 2 (two) times daily.    Dispense:  180 tablet    Refill:  3   lamoTRIgine  (LAMICTAL ) 25 MG tablet    Sig: Take 3 tablets (75 mg total) by mouth 2 (two) times daily.    Dispense:  540 tablet    Refill:  3   HISTORY OF PRESENT ILLNESS: Today 08/27/23 No headaches, heavy touch to left eye brow may trigger some sensitivity, but otherwise nothing. He is very happy. Taking Lamictal  75 mg BID, Topamax  50 mg twice daily. Medications are perfect. Last year reduced Lamictal  to 50 mg BID, had some pain twinges, we went back up. No side effects. Turning 83 few days. Drives, is independent.   Update 08/21/22 SS: Cut back Lamictal  to 50/75 mg (was taking 75 mg BID) and Topamax  50 mg twice daily. No headaches, not even a twinge. Health is good. Has some balance issues, did some PT. No side effect from medication. He drove here today. PCP follows labs. BP slightly up, was late today in traffic.   Update 02/05/22 SS: Christian Ross is here today for follow-up.  Was last seen in April 2023, Lamictal  was reduced to 75 mg twice daily.  He remains on this along with Topamax  50 mg twice daily.  Had a fall back in August, went to the ER, had some vertigo but now resolved.  MRI of the brain showed no acute abnormality.  Creatinine 1.48,  AST 45, glucose 141, Hgb 11.9. Has not had any further headaches. He is doing very well. Is willing to reduce the Lamictal  further. Is doing some PT for balance, only 1 session since the fall. He drove here today, lives with his wife.   MRI of the brain  12/04/21 IMPRESSION: 1. No acute intracranial abnormality. 2. Findings of chronic small vessel ischemia.  Update 08/07/21 SS: Christian Ross here today for follow-up.  On Lamictal  and Topamax . Taking Lamictal  100 mg twice daily, Topamax  50 mg twice daily. Having hardly any pain at all, he has to search for it. Doesn't have any side effect from medications. Having right hip replacement in 2 weeks. PCP follows labs.   HISTORY  03/13/2021 Dr. Billy Bue: Returns today because twinges are returning. They are triggered by touching his face, or winking with his left eye. This causes a quick electrical shock over his left eye lasting a second at a time. This is worst in the morning when he first gets up. No lacrimation or redness of his left eye (had this previously with more prolonged episodes).   He increased Topamax  to 50 mg BID yesterday and hasn't noticed much of a difference yet. Is taking Lamictal  50 mg BID.  REVIEW OF SYSTEMS: Out of a complete 14 system review of symptoms,  the patient complains only of the following symptoms, and all other reviewed systems are negative.  See HPI  ALLERGIES: Allergies  Allergen Reactions   Lisinopril Cough and Other (See Comments)    Other Reaction(s): Not available    HOME MEDICATIONS: Outpatient Medications Prior to Visit  Medication Sig Dispense Refill   apixaban  (ELIQUIS ) 5 MG TABS tablet Take 1 tablet (5 mg total) by mouth 2 (two) times daily. 60 tablet 1   Apoaequorin (PREVAGEN PO) Take 1 tablet by mouth daily.     blood glucose meter kit and supplies Dispense based on patient and insurance preference. Use up to four times daily as directed. (FOR ICD-10 E10.9, E11.9). 1 each 0   Continuous Glucose  Receiver (DEXCOM G7 RECEIVER) DEVI      Continuous Glucose Sensor (FREESTYLE LIBRE 3 PLUS SENSOR) MISC Change sensor every 15 days. 2 each 5   fenofibrate  (TRICOR ) 145 MG tablet TAKE 1 TABLET(145 MG) BY MOUTH DAILY (Patient taking differently: Take 145 mg by mouth daily.) 90 tablet 3   Glucosamine-Chondroit-Vit C-Mn (GLUCOSAMINE 1500 COMPLEX) CAPS Take 2 tablets by mouth daily.     icosapent  Ethyl (VASCEPA ) 1 g capsule TAKE 2 CAPSULES TWICE A DAY 360 capsule 3   insulin  glargine (LANTUS  SOLOSTAR) 100 UNIT/ML Solostar Pen Inject 25 Units into the skin at bedtime. 15 mL 11   Insulin  Pen Needle (PEN NEEDLES 3/16") 31G X 5 MM MISC 1 each by Does not apply route at bedtime. 100 each 5   lamoTRIgine  (LAMICTAL ) 25 MG tablet Take 2 tablets (50 mg total) by mouth 2 (two) times daily. 360 tablet 3   metoprolol  tartrate (LOPRESSOR ) 25 MG tablet TAKE 1 TABLET TWICE A DAY 180 tablet 3   omeprazole  (PRILOSEC) 20 MG capsule Take 1 capsule (20 mg total) by mouth daily. 30 minutes before breakfast 90 capsule 3   topiramate  (TOPAMAX ) 50 MG tablet Take 1 tablet (50 mg total) by mouth 2 (two) times daily. 180 tablet 3   Ubiquinol 200 MG CAPS Take 400 mg by mouth in the morning and at bedtime.      No facility-administered medications prior to visit.    PAST MEDICAL HISTORY: Past Medical History:  Diagnosis Date   Allergy    Aortic atherosclerosis (HCC)    Arthritis    Bilateral nephrolithiasis    nonobstructing   Cancer (HCC)    skin pre cancer nose   Cough 08/2014   new x58mo, denies fever and shortness of breath   Diabetes mellitus without complication (HCC)    type 2    Diverticulitis    Erectile dysfunction    Hemorrhoids    History of skin cancer    basal cell removed left arm 09/07/14   Hx of colonic polyps    Hyperlipidemia    Hypertension    orthostatic   Hypertriglyceridemia    Kidney stones    Rotator cuff tear    left   Sleep apnea    cpap setting at 4    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 Tilda Fogo   Vitamin D deficiency     PAST SURGICAL HISTORY: Past Surgical History:  Procedure Laterality Date   COLONOSCOPY W/ BIOPSIES AND POLYPECTOMY  multiple, 2005, 08, 14   KNEE SURGERY  1960   right / for osgood- schlatter   NASAL SINUS SURGERY     RECTAL BIOPSY     SHOULDER ARTHROSCOPY WITH ROTATOR CUFF REPAIR AND SUBACROMIAL DECOMPRESSION Right 12/14/2019  Procedure: Right shoulder mini open rotator cuff repair, subacromial decompression,;  Surgeon: Orvan Blanch, MD;  Location: [facility];  Service: Orthopedics;  Laterality: Right;  2 hrs Choice with Block   SHOULDER OPEN ROTATOR CUFF REPAIR Left 09/14/2014   Procedure: LEFT SHOULDER MINI OPEN ROTATOR CUFF REPAIR AND SUBACROMIAL DECOMPRESSION ;  Surgeon: Orvan Blanch, MD;  Location: [facility];  Service: Orthopedics;  Laterality: Left;   TOTAL HIP ARTHROPLASTY Right 08/22/2021   Procedure: TOTAL HIP ARTHROPLASTY ANTERIOR APPROACH;  Surgeon: Adonica Hoose, MD;  Location: [facility];  Service: Orthopedics;  Laterality: Right;  150    FAMILY HISTORY: Family History  Problem Relation Age of Onset   Colon cancer Neg Hx    Colon polyps Neg Hx    Esophageal cancer Neg Hx    Rectal cancer Neg Hx    Stomach cancer Neg Hx     SOCIAL HISTORY: Social History   Socioeconomic History   Marital status: Married    Spouse name: Not on file   Number of children: 2   Years of education: college   Highest education level: Not on file  Occupational History   Occupation: Event organiser: RETIRED  Tobacco Use   Smoking status: Never   Smokeless tobacco: Never  Vaping Use   Vaping status: Never Used  Substance and Sexual Activity   Alcohol  use: Yes    Comment: rarely   Drug use: No   Sexual activity: Never  Other Topics Concern   Not on file  Social History Narrative   Lives at home w/ wife   Patient drinks a couple of sodas a week.   Patient is right handed.    Social Drivers of Corporate investment banker Strain:  Not on file  Food Insecurity: No Food Insecurity (03/20/2023)   Hunger Vital Sign    Worried About Running Out of Food in the Last Year: Never true    Ran Out of Food in the Last Year: Never true  Transportation Needs: No Transportation Needs (03/20/2023)   PRAPARE - Administrator, Civil Service (Medical): No    Lack of Transportation (Non-Medical): No  Physical Activity: Not on file  Stress: Not on file  Social Connections: Not on file  Intimate Partner Violence: Not At Risk (03/20/2023)   Humiliation, Afraid, Rape, and Kick questionnaire    Fear of Current or Ex-Partner: No    Emotionally Abused: No    Physically Abused: No    Sexually Abused: No   PHYSICAL EXAM  Vitals:   08/27/23 1040 08/27/23 1046  BP: (!) 158/86 (!) 151/75  Pulse:  (!) 57  Resp:  14  SpO2:  95%  Weight:  201 lb 8 oz (91.4 kg)  Height:  5\' 11"  (1.803 m)    Body mass index is 28.1 kg/m.  Generalized: Well developed, in no acute distress  Neurological examination  Mentation: Alert oriented to time, place, history taking. Follows all commands speech and language fluent Cranial nerve II-XII: Pupils were equal round reactive to light. Extraocular movements were full, visual field were full on confrontational test. Facial sensation and strength were normal. Head turning and shoulder shrug  were normal and symmetric. Motor: The motor testing reveals 5 over 5 strength of all 4 extremities. Good symmetric motor tone is noted throughout.  Sensory: Sensory testing is intact to soft touch on all 4 extremities. No evidence of extinction is noted.  Coordination: Cerebellar testing reveals good finger-nose-finger and heel-to-shin  bilaterally.  Gait and station: Gait is slightly wide-based, independent Reflexes: Deep tendon reflexes are symmetric and normal bilaterally.   DIAGNOSTIC DATA (LABS, IMAGING, TESTING) - I reviewed patient records, labs, notes, testing and imaging myself where available.  Lab  Results  Component Value Date   WBC 6.2 03/21/2023   HGB 12.2 (L) 03/21/2023   HCT 37.1 (L) 03/21/2023   MCV 93.9 03/21/2023   PLT 208 03/21/2023      Component Value Date/Time   NA 137 04/10/2023 1158   NA 140 06/12/2020 0806   K 4.4 04/10/2023 1158   CL 103 04/10/2023 1158   CO2 25 04/10/2023 1158   GLUCOSE 190 (H) 04/10/2023 1158   BUN 37 (H) 04/10/2023 1158   BUN 23 06/12/2020 0806   CREATININE 1.61 (H) 04/10/2023 1158   CALCIUM  9.6 04/10/2023 1158   PROT 7.6 04/10/2023 1158   PROT 7.3 06/12/2020 0806   ALBUMIN 4.0 04/10/2023 1158   ALBUMIN 4.4 06/12/2020 0806   AST 24 04/10/2023 1158   ALT 21 04/10/2023 1158   ALKPHOS 57 04/10/2023 1158   BILITOT 0.5 04/10/2023 1158   BILITOT 0.4 06/12/2020 0806   GFRNONAA 58 (L) 03/23/2023 0746   GFRAA 57 (L) 06/12/2020 0806   Lab Results  Component Value Date   CHOL 91 (L) 09/18/2020   HDL 35 (L) 09/18/2020   LDLCALC 27 09/18/2020   LDLDIRECT 98.0 01/15/2015   TRIG 178 (H) 09/18/2020   CHOLHDL 2.6 09/18/2020   Lab Results  Component Value Date   HGBA1C 11.9 (H) 03/21/2023   No results found for: "VITAMINB12" No results found for: "TSH"  Jeanmarie Millet, AGNP-C, DNP 08/27/2023, 10:49 AM [facility] [address], [address] [place], [place] [address] [phone]

## 2023-09-15 ENCOUNTER — Telehealth: Payer: Self-pay | Admitting: Cardiovascular Disease

## 2023-09-15 NOTE — Telephone Encounter (Signed)
 Patient c/o Palpitations: STAT if patient c/o lightheadedness, shortness of breath, or chest pain  How long have you had palpitations/irregular HR/ Afib? Are you having the symptoms now?  Past few weeks BP monitor has read that he's been in afib. Patient says he is asymptomatic.  Are you currently experiencing lightheadedness, SOB or CP?  No   Do you have a history of afib (atrial fibrillation) or irregular heart rhythm?  No   Have you checked your BP or HR? (document readings if available):  117/69 today, but occasionally higher in the 130-140's.  Are you experiencing any other symptoms?  No

## 2023-09-15 NOTE — Telephone Encounter (Signed)
 Patient reports his BP cuff read that he is in a-fib. He denies any dizziness, CP, SOB, fatigue or palpitations.  He is currently taking Eliquis  and metoprolol . He reports his HR is in the 70's. BP today was 117/69.  Advised patient to call if he has A-Fib symptoms or HR >120. Patient verbalized understanding and expressed appreciation for call.

## 2023-09-21 DIAGNOSIS — H00012 Hordeolum externum right lower eyelid: Secondary | ICD-10-CM | POA: Diagnosis not present

## 2023-10-15 NOTE — Progress Notes (Unsigned)
 Christian Ross Date of Birth  06-12-1940             1. Hyperlipidemia 2.Hypertension  Notes prior to 2014:   Christian Ross seems to be doing well.  He continues to have elevations in his triglyceride levels.  He is tolerating the crestor  now.    Sept. 15, 2014:   Still active flying, not exercising as much as he should.  No CP or dyspnea.  Sept. 24, 2015:  Christian Ross is doing well.  Has lost 10 lbs since  I last saw him.  Exercising more.  HbA1C  Has improved.   He is no longer flying because he is on Lamactil.    Sept. 26, 2016:     Doing well from a cardiac standpoint On Flomax  for BPH.  No cardiac issues.   Sept. 19, 2017  Doing well from a cardiac standpoint. Had some foot issues.   Was in a boot for several months  Now has orthotic arch supports in his shoes.  Glucose has been slightly elevated.    October 20, 2016:  Christian Ross is seen today for follow up .  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 25, 2019:  No longer is flying . He is off the meds that prevented him from passing his medical  No CP or dyspnea Knows that he needs to exercise more His medical doctor is following his DM and lipids   Feb. 25, 2020: He was seen today for follow-up of his chest pain and hyperlipidemia. Has a question about Vascepa   He had recent labs drawn drawn at his primary medical office.  His last cholesterol was 213.  His HDL is 31.  The LDL is 125.  The triglyceride level is 281.  Open A1c is 7.7.  Hemoglobin is 13.2.  TSH is 3.79. Still eating lots of carbs and fats Is not walking    Feb. 24, 2021 Christian Ross is seen for follow up of his CP, HTN, HLD We started Vascepa  at his last visit  Recent Trigs are still markedly elevated - 344 Chol = 188,  LDL is 101 BMP is stable  Eating more sweets than he should.  Is not exercising much   July 30, 2020: Christian Ross is seen today for follow up of his CP, HTN, HLD  No CP or dyspnea  Was just  started on Repatha    October 18, 2021 Christian Ross is seen today for follow up of his HTN, HLD Is no longer flying  New Right hip since I last saw him  Having some stomach issues ,  recurrent abdomina pain  No cp , no dyspnea  Has become deconditioned   Had some chest pressure and went to the ER on June 15,  Work up was normal .  Troponins were negative  Has a soft systolic murmur  Will get an echo   October 21, 2022  Christian Ross  is seen for follow up visit  October 16, 2023 Christian Ross is seen for follow up of his HTN, HLD , atrial fib , DM   Echo from Nov 2024 shows: Mildly depressed LV systolic function with EF 45-50% Unable to evaluated his diastolic function  RV function is mildly reduced , RV is mildly enlarged.  PA pressure is normal  Trivial MR  + calcification of the aV , trivial AI  He is on eliquis  and metoprolol  for his atrial fib LDL is  152 He has not wanted to take a statin Christian Ross presented information abotu Nexlizet, repatha , Leqvio  He wanted to research these options and will discuss today   ( Need coronary calcium  score)   Has mildly reduced LV function  Discuss entresto, ARM, spiro  He is having some balance issues       Current Outpatient Medications on File Prior to Visit  Medication Sig Dispense Refill   apixaban  (ELIQUIS ) 5 MG TABS tablet Take 1 tablet (5 mg total) by mouth 2 (two) times daily. 60 tablet 1   Apoaequorin (PREVAGEN PO) Take 1 tablet by mouth daily.     blood glucose meter kit and supplies Dispense based on patient and insurance preference. Use up to four times daily as directed. (FOR ICD-10 E10.9, E11.9). 1 each 0   Continuous Glucose Receiver (DEXCOM G7 RECEIVER) DEVI      fenofibrate  (TRICOR ) 145 MG tablet TAKE 1 TABLET(145 MG) BY MOUTH DAILY (Patient taking differently: Take 145 mg by mouth daily.) 90 tablet 3   Glucosamine-Chondroit-Vit C-Mn (GLUCOSAMINE 1500 COMPLEX) CAPS Take 2 tablets by mouth daily.     icosapent  Ethyl (VASCEPA ) 1 g capsule  TAKE 2 CAPSULES TWICE A DAY 360 capsule 3   insulin  glargine (LANTUS  SOLOSTAR) 100 UNIT/ML Solostar Pen Inject 25 Units into the skin at bedtime. 15 mL 11   Insulin  Pen Needle (PEN NEEDLES 3/16) 31G X 5 MM MISC 1 each by Does not apply route at bedtime. 100 each 5   lamoTRIgine  (LAMICTAL ) 25 MG tablet Take 3 tablets (75 mg total) by mouth 2 (two) times daily. 540 tablet 3   metoprolol  tartrate (LOPRESSOR ) 25 MG tablet TAKE 1 TABLET TWICE A DAY 180 tablet 3   omeprazole  (PRILOSEC) 20 MG capsule Take 1 capsule (20 mg total) by mouth daily. 30 minutes before breakfast 90 capsule 3   topiramate  (TOPAMAX ) 50 MG tablet Take 1 tablet (50 mg total) by mouth 2 (two) times daily. 180 tablet 3   Ubiquinol 200 MG CAPS Take 400 mg by mouth in the morning and at bedtime.      Continuous Glucose Sensor (FREESTYLE LIBRE 3 PLUS SENSOR) MISC Change sensor every 15 days. (Patient not taking: Reported on 10/16/2023) 2 each 5   [DISCONTINUED] irbesartan  (AVAPRO ) 150 MG tablet Take 1 tablet (150 mg total) by mouth daily. 90 tablet 3   No current facility-administered medications on file prior to visit.    Allergies  Allergen Reactions   Lisinopril Cough and Other (See Comments)    Other Reaction(s): Not available    Past Medical History:  Diagnosis Date   Allergy    Aortic atherosclerosis (HCC)    Arthritis    Bilateral nephrolithiasis    nonobstructing   Cancer (HCC)    skin pre cancer nose   Cough 08/2014   new x13mo, denies fever and shortness of breath   Diabetes mellitus without complication (HCC)    type 2    Diverticulitis    Erectile dysfunction    Hemorrhoids    History of skin cancer    basal cell removed left arm 09/07/14   Hx of colonic polyps    Hyperlipidemia    Hypertension    orthostatic   Hypertriglyceridemia    Kidney stones    Rotator cuff tear    left   Sleep apnea    cpap setting at 4    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 Jenel   Vitamin D deficiency  Past  Surgical History:  Procedure Laterality Date   COLONOSCOPY W/ BIOPSIES AND POLYPECTOMY  multiple, 2005, 08, 14   KNEE SURGERY  1960   right / for osgood- schlatter   NASAL SINUS SURGERY     RECTAL BIOPSY     SHOULDER ARTHROSCOPY WITH ROTATOR CUFF REPAIR AND SUBACROMIAL DECOMPRESSION Right 12/14/2019   Procedure: Right shoulder mini open rotator cuff repair, subacromial decompression,;  Surgeon: Duwayne Purchase, MD;  Location: [facility];  Service: Orthopedics;  Laterality: Right;  2 hrs Choice with Block   SHOULDER OPEN ROTATOR CUFF REPAIR Left 09/14/2014   Procedure: LEFT SHOULDER MINI OPEN ROTATOR CUFF REPAIR AND SUBACROMIAL DECOMPRESSION ;  Surgeon: Purchase Duwayne, MD;  Location: [facility];  Service: Orthopedics;  Laterality: Left;   TOTAL HIP ARTHROPLASTY Right 08/22/2021   Procedure: TOTAL HIP ARTHROPLASTY ANTERIOR APPROACH;  Surgeon: Fidel Rogue, MD;  Location: [facility];  Service: Orthopedics;  Laterality: Right;  150    Social History   Tobacco Use  Smoking Status Never  Smokeless Tobacco Never    Social History   Substance and Sexual Activity  Alcohol  Use Yes   Comment: rarely    Family History  Problem Relation Age of Onset   Colon cancer Neg Hx    Colon polyps Neg Hx    Esophageal cancer Neg Hx    Rectal cancer Neg Hx    Stomach cancer Neg Hx     Reviw of Systems:  Reviewed in the HPI.  All other systems are negative.    Physical Exam: Blood pressure (!) 140/80, pulse 64, height 5' 11 (1.803 m), weight 198 lb 9.6 oz (90.1 kg), SpO2 96%.    GEN:  Well nourished, well developed in no acute distress HEENT: Normal NECK: No JVD; No carotid bruits LYMPHATICS: No lymphadenopathy CARDIAC: irreg, irreg.   HR is very well controlled.  RESPIRATORY:  Clear to auscultation without rales, wheezing or rhonchi  ABDOMEN: Soft, non-tender, non-distended MUSCULOSKELETAL:  No edema; No deformity  SKIN: Warm and dry NEUROLOGIC:  Alert and oriented x 3     ECG:     Assessment / Plan:   1. Hyperlipidemia-    long discussion about lipids, coronary calcium  CT  He wants to use non medication methods for treating his hyperlipidema  He discussed chelation We discussed that chelation is not useful for HLD  I offered to have him seen in the lipid clinic  He wanted to research some other medications    2.Hypertension-     mildly elevated. Will start entresto   2.  Mildly reduced LV EF   Will start Entresto 24-26 po BID  Check BMP in 3 weeks  Follow up with Christian Ross or other APP    Aleene Passe, MD  10/17/2023 2:18 PM    [facility] [address] [place],  [address] [place], [place]  [address] Pager [phone] Phone: [phone]; Fax: [phone]

## 2023-10-16 ENCOUNTER — Encounter: Payer: Self-pay | Admitting: Cardiovascular Disease

## 2023-10-16 ENCOUNTER — Ambulatory Visit: Payer: Medicare Other | Attending: Cardiovascular Disease | Admitting: Cardiovascular Disease

## 2023-10-16 VITALS — BP 140/80 | HR 64 | Ht 71.0 in | Wt 198.6 lb

## 2023-10-16 DIAGNOSIS — I10 Essential (primary) hypertension: Secondary | ICD-10-CM | POA: Diagnosis not present

## 2023-10-16 DIAGNOSIS — I5020 Unspecified systolic (congestive) heart failure: Secondary | ICD-10-CM | POA: Diagnosis not present

## 2023-10-16 DIAGNOSIS — I480 Paroxysmal atrial fibrillation: Secondary | ICD-10-CM | POA: Diagnosis not present

## 2023-10-16 MED ORDER — ENTRESTO 24-26 MG PO TABS: 1.0000 | ORAL_TABLET | Freq: Two times a day (BID) | ORAL | 0 refills | Status: DC

## 2023-10-16 NOTE — Patient Instructions (Signed)
 Medication Instructions:  START Entresto 24-26 mg twice daily *If you need a refill on your cardiac medications before your next appointment, please call your pharmacy*  Lab Work: BMP in three weeks  If you have labs (blood work) drawn today and your tests are completely normal, you will receive your results only by: MyChart Message (if you have MyChart) OR A paper copy in the mail If you have any lab test that is abnormal or we need to change your treatment, we will call you to review the results.  Follow-Up: At [facility], you and your health needs are our priority.  As part of our continuing mission to provide you with exceptional heart care, our providers are all part of one team.  This team includes your primary Cardiologist (physician) and Advanced Practice Providers or APPs (Physician Assistants and Nurse Practitioners) who all work together to provide you with the care you need, when you need it.  Your next appointment:   3-4 Months  Provider:   Orren Fabry, PA-C    Other Instructions Book -  [employer]   by [employer]  Also a cook book by the same name

## 2023-11-11 DIAGNOSIS — I10 Essential (primary) hypertension: Secondary | ICD-10-CM | POA: Diagnosis not present

## 2023-11-12 LAB — BASIC METABOLIC PANEL WITH GFR
BUN/Creatinine Ratio: 27 — ABNORMAL HIGH (ref 10–24)
BUN: 46 mg/dL — ABNORMAL HIGH (ref 8–27)
CO2: 17 mmol/L — ABNORMAL LOW (ref 20–29)
Calcium: 9.7 mg/dL (ref 8.6–10.2)
Chloride: 106 mmol/L (ref 96–106)
Creatinine, Ser: 1.7 mg/dL — ABNORMAL HIGH (ref 0.76–1.27)
Glucose: 91 mg/dL (ref 70–99)
Potassium: 4.3 mmol/L (ref 3.5–5.2)
Sodium: 140 mmol/L (ref 134–144)
eGFR: 40 mL/min/1.73 — ABNORMAL LOW (ref >59)

## 2023-11-13 ENCOUNTER — Ambulatory Visit: Payer: Self-pay | Admitting: Physician Assistant

## 2023-11-13 ENCOUNTER — Telehealth: Payer: Self-pay

## 2023-11-13 DIAGNOSIS — N183 Chronic kidney disease, stage 3 unspecified: Secondary | ICD-10-CM

## 2023-11-13 NOTE — Telephone Encounter (Signed)
 Referral to nephrology per T. Lucien.

## 2023-12-11 ENCOUNTER — Other Ambulatory Visit: Payer: Self-pay | Admitting: Physician Assistant

## 2023-12-14 MED ORDER — METOPROLOL TARTRATE 25 MG PO TABS
25.0000 mg | ORAL_TABLET | Freq: Two times a day (BID) | ORAL | 3 refills | Status: DC
Start: 2023-12-14 — End: 2023-12-16

## 2023-12-16 ENCOUNTER — Other Ambulatory Visit: Payer: Self-pay

## 2023-12-16 MED ORDER — METOPROLOL TARTRATE 25 MG PO TABS: 25.0000 mg | ORAL_TABLET | Freq: Two times a day (BID) | ORAL | 2 refills | Status: AC

## 2023-12-22 ENCOUNTER — Other Ambulatory Visit: Payer: Self-pay

## 2023-12-23 MED ORDER — SACUBITRIL-VALSARTAN 24-26 MG PO TABS: 1.0000 | ORAL_TABLET | Freq: Two times a day (BID) | ORAL | 0 refills | Status: DC

## 2023-12-30 ENCOUNTER — Other Ambulatory Visit: Payer: Self-pay

## 2023-12-30 MED ORDER — LAMOTRIGINE 25 MG PO TABS: 75.0000 mg | ORAL_TABLET | Freq: Two times a day (BID) | ORAL | 3 refills | Status: AC

## 2024-01-01 DIAGNOSIS — E1122 Type 2 diabetes mellitus with diabetic chronic kidney disease: Secondary | ICD-10-CM | POA: Diagnosis not present

## 2024-01-01 DIAGNOSIS — I129 Hypertensive chronic kidney disease with stage 1 through stage 4 chronic kidney disease, or unspecified chronic kidney disease: Secondary | ICD-10-CM | POA: Diagnosis not present

## 2024-01-01 DIAGNOSIS — N1832 Chronic kidney disease, stage 3b: Secondary | ICD-10-CM | POA: Diagnosis not present

## 2024-01-01 DIAGNOSIS — E559 Vitamin D deficiency, unspecified: Secondary | ICD-10-CM | POA: Diagnosis not present

## 2024-01-01 DIAGNOSIS — E785 Hyperlipidemia, unspecified: Secondary | ICD-10-CM | POA: Diagnosis not present

## 2024-01-01 DIAGNOSIS — E781 Pure hyperglyceridemia: Secondary | ICD-10-CM | POA: Diagnosis not present

## 2024-01-01 DIAGNOSIS — I4891 Unspecified atrial fibrillation: Secondary | ICD-10-CM | POA: Diagnosis not present

## 2024-01-06 ENCOUNTER — Other Ambulatory Visit: Payer: Self-pay | Admitting: Nephrology

## 2024-01-06 DIAGNOSIS — N1832 Chronic kidney disease, stage 3b: Secondary | ICD-10-CM

## 2024-01-08 ENCOUNTER — Ambulatory Visit
Admission: RE | Admit: 2024-01-08 | Discharge: 2024-01-08 | Disposition: A | Discharge status: Home / Self Care | Source: Ambulatory Visit | Attending: Nephrology | Admitting: Nephrology

## 2024-01-08 DIAGNOSIS — N1832 Chronic kidney disease, stage 3b: Secondary | ICD-10-CM | POA: Diagnosis not present

## 2024-01-10 NOTE — Progress Notes (Unsigned)
 Cardiology Office Note:  .   Date:  01/11/2024  ID:  Christian Ross, DOB [birth year], MRN [id number] PCP: Kip Beverley SQUIBB, MD (Inactive)  [facility] Providers Cardiologist:  Aleene Passe, MD (Inactive) {  History of Present Illness: .   Christian Ross is a 83 y.o. male with a past medical history of hyperlipidemia and hypertension who has been seen by Dr. Passe since 2014.  He is here for follow-up visit.  History includes BPH issues taking Flomax , trying to improve exercise over time.  Glucoses have been slightly elevated in 2017 and was being watched closely.  Tolerating Crestor  for hyperlipidemia.  He also has a history of cluster headaches.  PCP was following his diabetes mellitus and lipids very closely.  In 2020 a discussion about Vascepa  with LDL 125 triglycerides 281.  Although eating lots of carbohydrates and fats which was discussed with the patient.  Eventually, started on Vascepa  February 2021 but recent triglycerides still markedly elevated.  Not exercising much at this point.  Started on Repatha  April 2022.  Had a new right hip replaced in 2023 and systemic issues causing her current abdominal pain.  No chest pain.  Became somewhat deconditioned due to this.  Had some chest pressure and went to the ER October 03, 2021 workup was negative.  Troponins were negative.  Discovered to have a soft systolic murmur at this time and echocardiogram was ordered.  Last seen October 21, 2022 and was doing well at that time.  He was seen by me January of this year,, he presents with a history of atrial fibrillation, diabetes, and high cholesterol, was recently hospitalized due to high blood glucose levels. The patient reported experiencing blurry vision due to the high glucose levels and was hospitalized for four days. During the hospital stay, the patient was started on a Freestyle glucose monitor for diabetes management. The patient also has a history of atrial fibrillation and is currently  on Eliquis . The patient reports no symptoms from the atrial fibrillation and is rate controlled. The patient also has a history of high cholesterol and has been on Repatha  in the past but discontinued due to lack of follow-up. The patient is reluctant to start statin therapy due to potential side effects. Repatha , nexlizet, leqvio discussed with the patient and he would like to do his own research and get back to us . He was encouraged to be on medication due to his LDL.  Reports no shortness of breath nor dyspnea on exertion. Reports no chest pain, pressure, or tightness. No edema, orthopnea, PND. Reports no palpitations.   Discussed the use of AI scribe software for clinical note transcription with the patient, who gave verbal consent to proceed.  Today, he presents with chronic atrial fibrillation and diabetes who presents for a follow-up visit.  He has atrial fibrillation and is on anticoagulation therapy with Eliquis  5 mg twice daily. He has not undergone any procedures for AFib conversion. He takes metoprolol  25 mg twice daily and recently started Entresto , which has caused itching without rash. He experiences a chronic dry cough and dry mouth, likely related to CPAP use.  He has diabetes with a recent A1c of 8.5 in April. He monitors his blood sugar daily but is unsure of his current A1c. He has made dietary changes to manage his cholesterol, with triglycerides at 145 in April.  He reports leg swelling and is not on a diuretic. He drinks Propel for hydration but is concerned about sodium content  and fluid retention. Creatinine levels are stable but slightly increasing.  Reports no shortness of breath nor dyspnea on exertion. Reports no chest pain, pressure, or tightness. No orthopnea, PND. Reports no palpitations.   Discussed the use of AI scribe software for clinical note transcription with the patient, who gave verbal consent to proceed.   ROS: Pertinent ROS in HPI  Studies Reviewed: .        Echo 03/21/23 IMPRESSIONS     1. Left ventricular ejection fraction, by estimation, is 45 to 50%. The  left ventricle has mildly decreased function. The left ventricle  demonstrates global hypokinesis. There is moderate asymmetric left  ventricular hypertrophy of the basal-septal  segment. Left ventricular diastolic function could not be evaluated.   2. Right ventricular systolic function is mildly reduced. The right  ventricular size is mildly enlarged. There is normal pulmonary artery  systolic pressure. The estimated right ventricular systolic pressure is  24.3 mmHg.   3. Right atrial size was mildly dilated.   4. The mitral valve is abnormal. Trivial mitral valve regurgitation.   5. The aortic valve is tricuspid. There is moderate calcification of the  aortic valve. Aortic valve regurgitation is trivial. Aortic valve  sclerosis/calcification is present, without any evidence of aortic  stenosis.   6. The inferior vena cava is normal in size with greater than 50%  respiratory variability, suggesting right atrial pressure of 3 mmHg.   Comparison(s): The left ventricular function is worsened.   FINDINGS   Left Ventricle: Left ventricular ejection fraction, by estimation, is 45  to 50%. The left ventricle has mildly decreased function. The left  ventricle demonstrates global hypokinesis. The left ventricular internal  cavity size was normal in size. There is   moderate asymmetric left ventricular hypertrophy of the basal-septal  segment. Abnormal (paradoxical) septal motion, consistent with left bundle  branch block. Left ventricular diastolic function could not be evaluated  due to atrial fibrillation. Left  ventricular diastolic function could not be evaluated.   Right Ventricle: The right ventricular size is mildly enlarged. No  increase in right ventricular wall thickness. Right ventricular systolic  function is mildly reduced. There is normal pulmonary artery systolic   pressure. The tricuspid regurgitant velocity   is 2.31 m/s, and with an assumed right atrial pressure of 3 mmHg, the  estimated right ventricular systolic pressure is 24.3 mmHg.   Left Atrium: Left atrial size was normal in size.   Right Atrium: Right atrial size was mildly dilated.   Pericardium: There is no evidence of pericardial effusion. Presence of  epicardial fat layer.   Mitral Valve: The mitral valve is abnormal. Mild mitral annular  calcification. Trivial mitral valve regurgitation.   Tricuspid Valve: The tricuspid valve is grossly normal. Tricuspid valve  regurgitation is mild . No evidence of tricuspid stenosis.   Aortic Valve: The aortic valve is tricuspid. There is moderate  calcification of the aortic valve. Aortic valve regurgitation is trivial.  Aortic valve sclerosis/calcification is present, without any evidence of  aortic stenosis. Aortic valve mean gradient  measures 6.0 mmHg. Aortic valve peak gradient measures 10.4 mmHg. Aortic  valve area, by VTI measures 1.90 cm.   Pulmonic Valve: The pulmonic valve was grossly normal. Pulmonic valve  regurgitation is trivial. No evidence of pulmonic stenosis.   Aorta: The aortic root and ascending aorta are structurally normal, with  no evidence of dilitation.   Venous: The inferior vena cava is normal in size with greater than 50%  respiratory variability, suggesting right atrial pressure of 3 mmHg.   IAS/Shunts: The atrial septum is grossly normal.   Risk Assessment/Calculations:    CHA2DS2-VASc Score = 4   This indicates a 4.8% annual risk of stroke. The patient's score is based upon: CHF History: 0 HTN History: 1 Diabetes History: 1 Stroke History: 0 Vascular Disease History: 0 Age Score: 2 Gender Score: 0  Physical Exam:   VS:  BP 110/60   Pulse 60   Ht 5' 11 (1.803 m)   Wt 199 lb 3.2 oz (90.4 kg)   SpO2 95%   BMI 27.78 kg/m    Wt Readings from Last 3 Encounters:  01/11/24 199 lb 3.2 oz (90.4  kg)  10/16/23 198 lb 9.6 oz (90.1 kg)  08/27/23 201 lb 8 oz (91.4 kg)    GEN: Well nourished, well developed in no acute distress NECK: No JVD; No carotid bruits CARDIAC: IRIR, soft systolic murmurs, rubs, gallops RESPIRATORY:  Clear to auscultation without rales, wheezing or rhonchi  ABDOMEN: Soft, non-tender, non-distended EXTREMITIES:  mild LE bilateral edema; No deformity   ASSESSMENT AND PLAN: .    Paroxysmal atrial fibrillation Managed with Eliquis  and metoprolol . Heart rate controlled at 60 bpm. - Continue Eliquis  5 mg twice daily. - Continue metoprolol  25 mg twice daily. -asymptomatic, labs reviewed  - Refer to a new cardiologist for ongoing management.  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 Recently started on Entresto . Pruritus possibly related to Entresto . - Schedule echocardiogram for late November to assess heart pump function. - Continue Entresto  at current dose. - Monitor for worsening pruritus or rash, and discontinue Entresto  if symptoms worsen.  Peripheral edema Possibly related to heart failure, kidney function, or sodium intake from Propel. No diuretics due to kidney concerns. - Monitor for worsening edema. - Limit Propel intake to one serving per day. - Consider as-needed diuretic if edema worsens.  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 Jardiance discussed for heart failure, diabetes, and kidney function. - Order A1c test today. - Consider starting Jardiance after discussing with primary care provider.  Chronic kidney disease Creatinine at 1.7. Slight changes in kidney function noted over the last year or two. - Monitor kidney function regularly. - Avoid diuretics unless necessary due to potential impact on kidney function. -okay for jardiance/farxiga from our standpoint  Mixed hyperlipidemia LDL at 132 mg/dL. Triglycerides 145 mg/dL in April.  Dietary changes made. Discussed Lovaza  side effects. - Schedule fasting lipid panel. - Consider switching from Lovaza  to an over-the-counter fish oil supplement if triglycerides remain controlled.  Essential hypertension - Continue current antihypertensive regimen.  Chronic unproductive cough No concerning symptoms. - Monitor cough symptoms. - Consider humidifier use if symptoms persist.  Pruritus, possibly medication-related Possibly related to Entresto . No rash observed. - Monitor for worsening pruritus or development of rash. - Discontinue Entresto  if symptoms worsen.  Gastrointestinal symptoms, possibly medication-related Excessive gas possibly related to medication interactions or diabetes management. Discussed potential contribution of Lovaza  and diabetes medications. - Discuss gastrointestinal symptoms with primary care provider. - Consider medication review with clinical pharmacist.     Dispo: He can follow-up in 6 months with Michele or Floretta   Signed, Orren LOISE Fabry, PA-C

## 2024-01-11 ENCOUNTER — Encounter: Payer: Self-pay | Admitting: Physician Assistant

## 2024-01-11 ENCOUNTER — Ambulatory Visit: Attending: Physician Assistant | Admitting: Physician Assistant

## 2024-01-11 VITALS — BP 110/60 | HR 60 | Ht 71.0 in | Wt 199.2 lb

## 2024-01-11 DIAGNOSIS — E1169 Type 2 diabetes mellitus with other specified complication: Secondary | ICD-10-CM | POA: Insufficient documentation

## 2024-01-11 DIAGNOSIS — Z794 Long term (current) use of insulin: Secondary | ICD-10-CM | POA: Insufficient documentation

## 2024-01-11 DIAGNOSIS — I480 Paroxysmal atrial fibrillation: Secondary | ICD-10-CM | POA: Insufficient documentation

## 2024-01-11 DIAGNOSIS — I5020 Unspecified systolic (congestive) heart failure: Secondary | ICD-10-CM | POA: Insufficient documentation

## 2024-01-11 DIAGNOSIS — I10 Essential (primary) hypertension: Secondary | ICD-10-CM | POA: Insufficient documentation

## 2024-01-11 DIAGNOSIS — E782 Mixed hyperlipidemia: Secondary | ICD-10-CM | POA: Insufficient documentation

## 2024-01-11 NOTE — Patient Instructions (Signed)
 Medication Instructions:  Your physician recommends that you continue on your current medications as directed. Please refer to the Current Medication list given to you today.   *If you need a refill on your cardiac medications before your next appointment, please call your pharmacy*  Lab Work: Fasting lipid panel, A1C this week  Testing/Procedures: Your physician has requested that you have an echocardiogram November 2025. Echocardiography is a painless test that uses sound waves to create images of your heart. It provides your doctor with information about the size and shape of your heart and how well your heart's chambers and valves are working. This procedure takes approximately one hour. There are no restrictions for this procedure. Please do NOT wear cologne, perfume, aftershave, or lotions (deodorant is allowed). Please arrive 15 minutes prior to your appointment time.  Please note: We ask at that you not bring children with you during ultrasound (echo/ vascular) testing. Due to room size and safety concerns, children are not allowed in the ultrasound rooms during exams. Our front office staff cannot provide observation of children in our lobby area while testing is being conducted. An adult accompanying a patient to their appointment will only be allowed in the ultrasound room at the discretion of the ultrasound technician under special circumstances. We apologize for any inconvenience.   Follow-Up: At [facility], you and your health needs are our priority.  As part of our continuing mission to provide you with exceptional heart care, our providers are all part of one team.  This team includes your primary Cardiologist (physician) and Advanced Practice Providers or APPs (Physician Assistants and Nurse Practitioners) who all work together to provide you with the care you need, when you need it.  Your next appointment:   6 month(s)  Provider:   Dr. Michele or Dr. Floretta      We recommend signing up for the patient portal called MyChart.  Sign up information is provided on this After Visit Summary.  MyChart is used to connect with patients for Virtual Visits (Telemedicine).  Patients are able to view lab/test results, encounter notes, upcoming appointments, etc.  Non-urgent messages can be sent to your provider as well.   To learn more about what you can do with MyChart, go to [email or web address].

## 2024-01-13 DIAGNOSIS — Z794 Long term (current) use of insulin: Secondary | ICD-10-CM | POA: Diagnosis not present

## 2024-01-13 DIAGNOSIS — E1169 Type 2 diabetes mellitus with other specified complication: Secondary | ICD-10-CM | POA: Diagnosis not present

## 2024-01-13 DIAGNOSIS — I480 Paroxysmal atrial fibrillation: Secondary | ICD-10-CM | POA: Diagnosis not present

## 2024-01-13 DIAGNOSIS — E782 Mixed hyperlipidemia: Secondary | ICD-10-CM | POA: Diagnosis not present

## 2024-01-13 DIAGNOSIS — I10 Essential (primary) hypertension: Secondary | ICD-10-CM | POA: Diagnosis not present

## 2024-01-13 DIAGNOSIS — I5020 Unspecified systolic (congestive) heart failure: Secondary | ICD-10-CM | POA: Diagnosis not present

## 2024-01-14 LAB — LIPID PANEL
Chol/HDL Ratio: 4.4 ratio (ref 0.0–5.0)
Cholesterol, Total: 166 mg/dL (ref 100–199)
HDL: 38 mg/dL — ABNORMAL LOW (ref >39)
LDL Chol Calc (NIH): 103 mg/dL — ABNORMAL HIGH (ref 0–99)
Triglycerides: 137 mg/dL (ref 0–149)
VLDL Cholesterol Cal: 25 mg/dL (ref 5–40)

## 2024-01-14 LAB — HEMOGLOBIN A1C
Est. average glucose Bld gHb Est-mCnc: 148 mg/dL
Hgb A1c MFr Bld: 6.8 % — AB (ref 4.8–5.6)

## 2024-01-15 ENCOUNTER — Ambulatory Visit: Payer: Self-pay | Admitting: Physician Assistant

## 2024-01-15 NOTE — Progress Notes (Signed)
 Called patient to inform him about his lab results. Patient mention that he would not like to start anything of these medication ( Repatha , Nexlizet, and Leqvio). Patient states that he would talk to his PCP first to see what he thinks.

## 2024-02-03 DIAGNOSIS — N183 Chronic kidney disease, stage 3 unspecified: Secondary | ICD-10-CM | POA: Diagnosis not present

## 2024-02-03 DIAGNOSIS — G44059 Short lasting unilateral neuralgiform headache with conjunctival injection and tearing (SUNCT), not intractable: Secondary | ICD-10-CM | POA: Diagnosis not present

## 2024-02-03 DIAGNOSIS — I4891 Unspecified atrial fibrillation: Secondary | ICD-10-CM | POA: Diagnosis not present

## 2024-02-03 DIAGNOSIS — E785 Hyperlipidemia, unspecified: Secondary | ICD-10-CM | POA: Diagnosis not present

## 2024-02-03 DIAGNOSIS — E1169 Type 2 diabetes mellitus with other specified complication: Secondary | ICD-10-CM | POA: Diagnosis not present

## 2024-02-03 DIAGNOSIS — I10 Essential (primary) hypertension: Secondary | ICD-10-CM | POA: Diagnosis not present

## 2024-02-08 ENCOUNTER — Other Ambulatory Visit: Payer: Self-pay | Admitting: Physician Assistant

## 2024-02-11 ENCOUNTER — Encounter: Payer: Self-pay | Admitting: Physician Assistant

## 2024-02-11 MED ORDER — FENOFIBRATE 145 MG PO TABS: ORAL_TABLET | ORAL | 3 refills | Status: AC

## 2024-02-11 NOTE — Telephone Encounter (Signed)
 Error

## 2024-02-18 ENCOUNTER — Ambulatory Visit (HOSPITAL_COMMUNITY)
Admission: RE | Admit: 2024-02-18 | Discharge: 2024-02-18 | Disposition: A | Discharge status: Home / Self Care | Source: Ambulatory Visit | Attending: Internal Medicine | Admitting: Internal Medicine

## 2024-02-18 DIAGNOSIS — I5020 Unspecified systolic (congestive) heart failure: Secondary | ICD-10-CM | POA: Diagnosis not present

## 2024-02-18 LAB — ECHOCARDIOGRAM COMPLETE
AR max vel: 2.28 cm2
AV Area VTI: 2.43 cm2
AV Area mean vel: 2.42 cm2
AV Mean grad: 6 mmHg
AV Peak grad: 10.8 mmHg
Ao pk vel: 1.65 m/s
S' Lateral: 2.6 cm

## 2024-03-04 ENCOUNTER — Other Ambulatory Visit: Payer: Self-pay | Admitting: Physician Assistant

## 2024-03-07 MED ORDER — SACUBITRIL-VALSARTAN 24-26 MG PO TABS: 1.0000 | ORAL_TABLET | Freq: Two times a day (BID) | ORAL | 3 refills | Status: AC

## 2024-03-23 DIAGNOSIS — I10 Essential (primary) hypertension: Secondary | ICD-10-CM | POA: Diagnosis not present

## 2024-03-23 DIAGNOSIS — G4733 Obstructive sleep apnea (adult) (pediatric): Secondary | ICD-10-CM | POA: Diagnosis not present

## 2024-06-14 ENCOUNTER — Ambulatory Visit (HOSPITAL_BASED_OUTPATIENT_CLINIC_OR_DEPARTMENT_OTHER): Admitting: Cardiology

## 2024-09-01 ENCOUNTER — Ambulatory Visit: Admitting: Neurology
